# Patient Record
Sex: Male | Born: 1945 | Race: White | Hispanic: No | Smoking: Current every day smoker
Health system: Southern US, Community
[De-identification: ages and names within clinical notes are randomized; demographics above are authoritative.]

## PROBLEM LIST (undated history)

## (undated) DIAGNOSIS — F32A Depression, unspecified: Secondary | ICD-10-CM

## (undated) DIAGNOSIS — B1920 Unspecified viral hepatitis C without hepatic coma: Secondary | ICD-10-CM

## (undated) DIAGNOSIS — K219 Gastro-esophageal reflux disease without esophagitis: Secondary | ICD-10-CM

## (undated) DIAGNOSIS — J449 Chronic obstructive pulmonary disease, unspecified: Secondary | ICD-10-CM

## (undated) DIAGNOSIS — IMO0001 Reserved for inherently not codable concepts without codable children: Secondary | ICD-10-CM

## (undated) DIAGNOSIS — J40 Bronchitis, not specified as acute or chronic: Secondary | ICD-10-CM

## (undated) DIAGNOSIS — M1990 Unspecified osteoarthritis, unspecified site: Secondary | ICD-10-CM

## (undated) DIAGNOSIS — J90 Pleural effusion, not elsewhere classified: Secondary | ICD-10-CM

## (undated) DIAGNOSIS — F4310 Post-traumatic stress disorder, unspecified: Secondary | ICD-10-CM

## (undated) DIAGNOSIS — I219 Acute myocardial infarction, unspecified: Secondary | ICD-10-CM

## (undated) DIAGNOSIS — I10 Essential (primary) hypertension: Secondary | ICD-10-CM

## (undated) DIAGNOSIS — I2510 Atherosclerotic heart disease of native coronary artery without angina pectoris: Secondary | ICD-10-CM

## (undated) DIAGNOSIS — F329 Major depressive disorder, single episode, unspecified: Secondary | ICD-10-CM

## (undated) DIAGNOSIS — I509 Heart failure, unspecified: Secondary | ICD-10-CM

## (undated) HISTORY — PX: APPENDECTOMY: SHX54

## (undated) HISTORY — PX: CORONARY ANGIOPLASTY: SHX604

---

## 1999-03-17 ENCOUNTER — Emergency Department (HOSPITAL_COMMUNITY): Admission: EM | Admit: 1999-03-17 | Discharge: 1999-03-17 | Payer: Self-pay | Admitting: Emergency Medicine

## 1999-12-04 ENCOUNTER — Emergency Department (HOSPITAL_COMMUNITY): Admission: EM | Admit: 1999-12-04 | Discharge: 1999-12-04 | Payer: Self-pay | Admitting: *Deleted

## 2006-02-13 ENCOUNTER — Emergency Department (HOSPITAL_COMMUNITY): Admission: EM | Admit: 2006-02-13 | Discharge: 2006-02-13 | Payer: Self-pay | Admitting: Emergency Medicine

## 2006-03-06 ENCOUNTER — Ambulatory Visit: Payer: Self-pay | Admitting: Emergency Medicine

## 2006-03-06 ENCOUNTER — Inpatient Hospital Stay (HOSPITAL_COMMUNITY): Admission: EM | Admit: 2006-03-06 | Discharge: 2006-03-08 | Payer: Self-pay | Admitting: Emergency Medicine

## 2006-03-21 ENCOUNTER — Inpatient Hospital Stay (HOSPITAL_COMMUNITY): Admission: EM | Admit: 2006-03-21 | Discharge: 2006-03-24 | Payer: Self-pay | Admitting: Emergency Medicine

## 2006-04-21 ENCOUNTER — Ambulatory Visit: Payer: Self-pay | Admitting: *Deleted

## 2006-04-21 ENCOUNTER — Inpatient Hospital Stay (HOSPITAL_COMMUNITY): Admission: EM | Admit: 2006-04-21 | Discharge: 2006-04-24 | Payer: Self-pay | Admitting: Emergency Medicine

## 2008-06-02 IMAGING — CR DG CHEST 2V
2 series · 2 of 2 positions shown · non-contrast
Comparison: 03/08/06.

CLINICAL DATA: Shortness of breath and cough. 
 CHEST - 2 VIEW:

[view not recorded (1 of 2)]
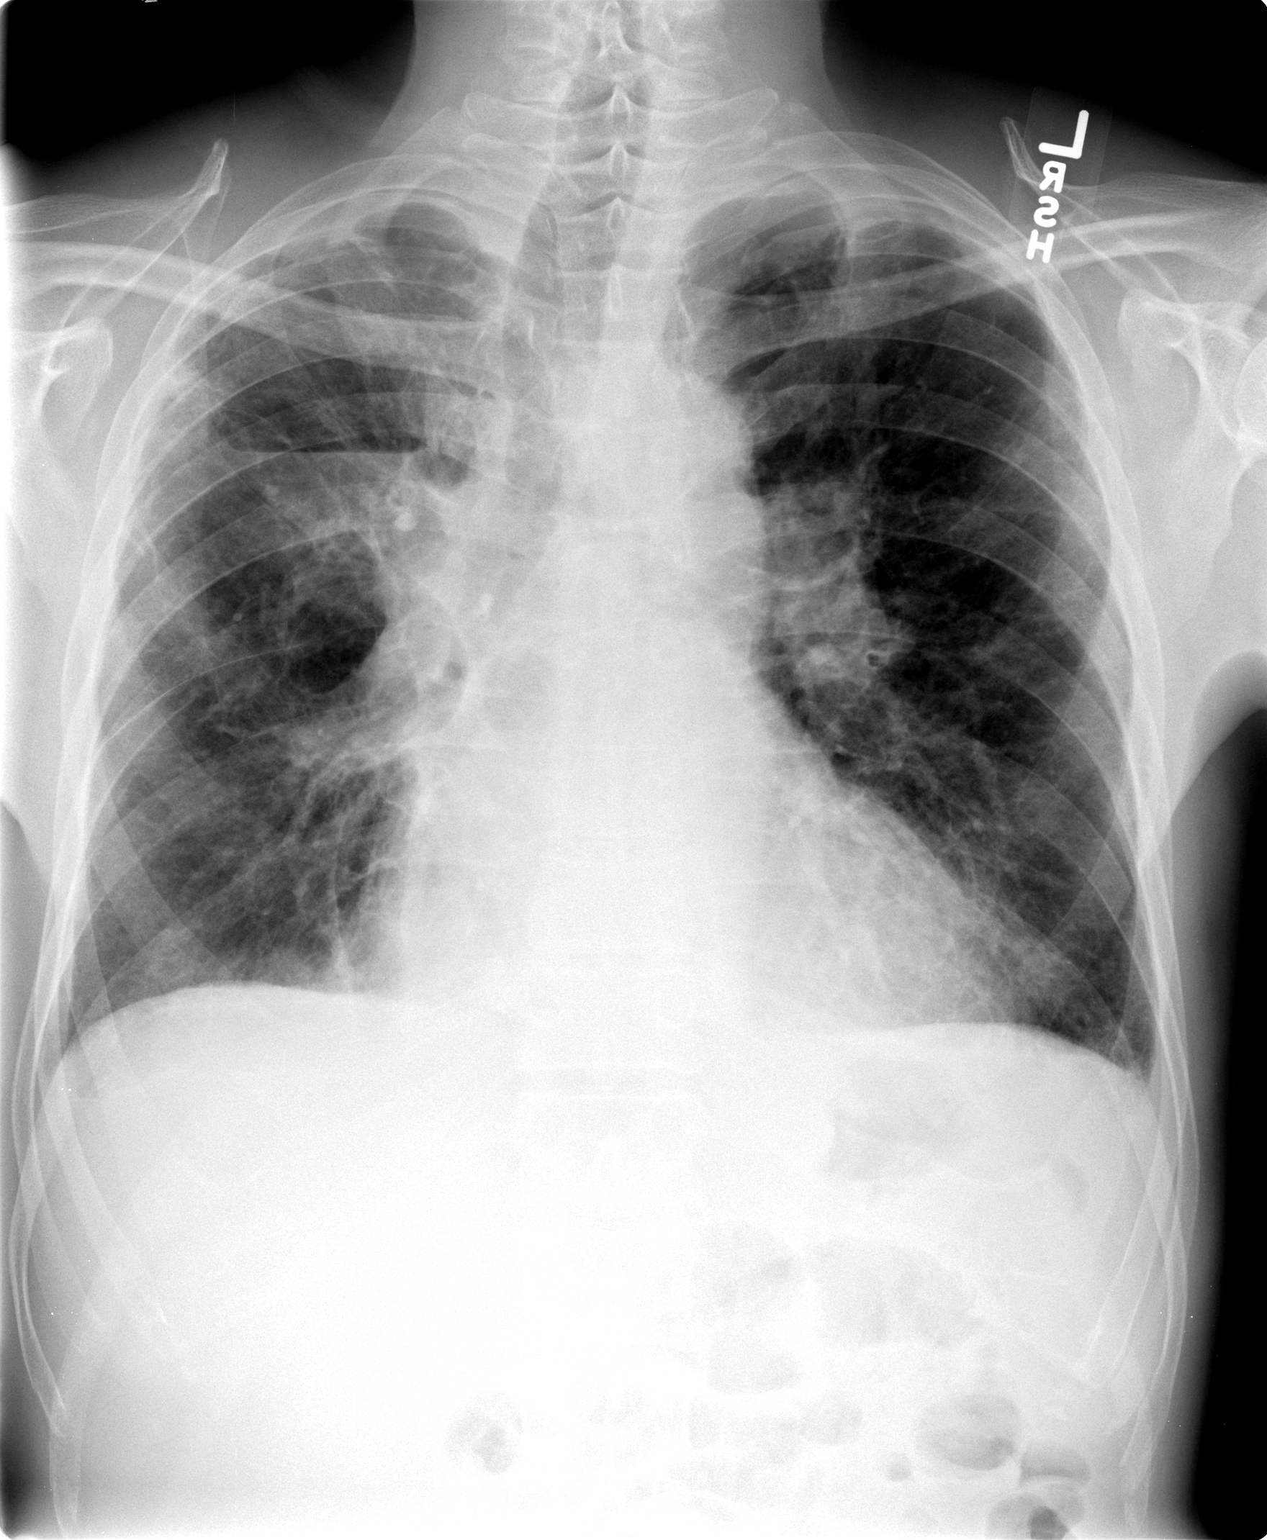

[view not recorded (2 of 2)]
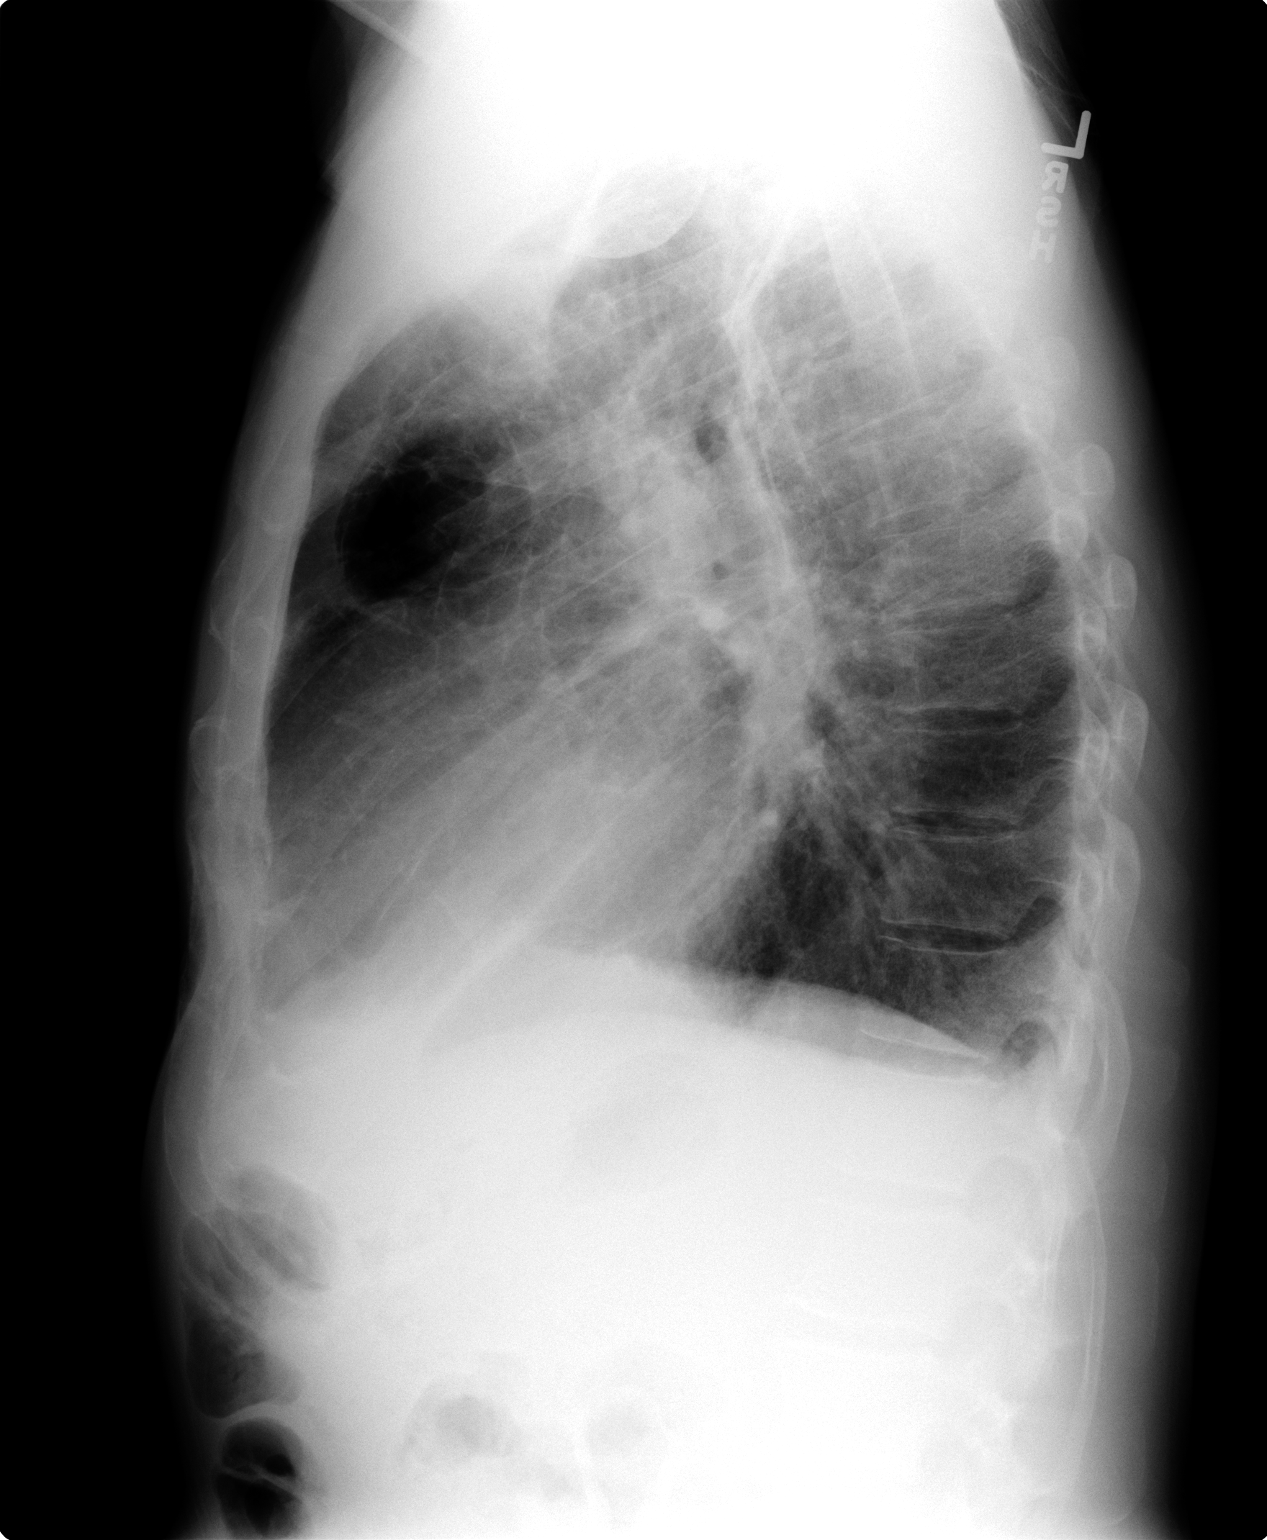

[2 of 2 positions shown; findings below may reference images not displayed]

FINDINGS: An air fluid level is seen in the right upper lung zone.  Lungs are emphysematous with bullous changes bilaterally.
IMPRESSION: Bullous emphysema with an air fluid level in the right upper lung zone.  This may represent an infected bullous lesion.  CT would be useful in further evaluation, as clinically indicated.

## 2011-10-18 ENCOUNTER — Encounter (HOSPITAL_COMMUNITY): Payer: Self-pay | Admitting: *Deleted

## 2011-10-18 ENCOUNTER — Emergency Department (HOSPITAL_COMMUNITY)
Admission: EM | Admit: 2011-10-18 | Discharge: 2011-10-18 | Disposition: A | Discharge status: Home / Self Care | Payer: Non-veteran care | Attending: Emergency Medicine | Admitting: Emergency Medicine

## 2011-10-18 ENCOUNTER — Emergency Department (HOSPITAL_COMMUNITY): Payer: Non-veteran care

## 2011-10-18 DIAGNOSIS — I509 Heart failure, unspecified: Secondary | ICD-10-CM | POA: Insufficient documentation

## 2011-10-18 DIAGNOSIS — R062 Wheezing: Secondary | ICD-10-CM | POA: Insufficient documentation

## 2011-10-18 DIAGNOSIS — I2510 Atherosclerotic heart disease of native coronary artery without angina pectoris: Secondary | ICD-10-CM | POA: Insufficient documentation

## 2011-10-18 DIAGNOSIS — R05 Cough: Secondary | ICD-10-CM | POA: Insufficient documentation

## 2011-10-18 DIAGNOSIS — I252 Old myocardial infarction: Secondary | ICD-10-CM | POA: Insufficient documentation

## 2011-10-18 DIAGNOSIS — J441 Chronic obstructive pulmonary disease with (acute) exacerbation: Secondary | ICD-10-CM | POA: Insufficient documentation

## 2011-10-18 DIAGNOSIS — R509 Fever, unspecified: Secondary | ICD-10-CM | POA: Insufficient documentation

## 2011-10-18 DIAGNOSIS — R059 Cough, unspecified: Secondary | ICD-10-CM | POA: Insufficient documentation

## 2011-10-18 DIAGNOSIS — E860 Dehydration: Secondary | ICD-10-CM | POA: Insufficient documentation

## 2011-10-18 DIAGNOSIS — I10 Essential (primary) hypertension: Secondary | ICD-10-CM | POA: Insufficient documentation

## 2011-10-18 DIAGNOSIS — R0602 Shortness of breath: Secondary | ICD-10-CM | POA: Insufficient documentation

## 2011-10-18 DIAGNOSIS — B1920 Unspecified viral hepatitis C without hepatic coma: Secondary | ICD-10-CM | POA: Insufficient documentation

## 2011-10-18 HISTORY — DX: Essential (primary) hypertension: I10

## 2011-10-18 HISTORY — DX: Chronic obstructive pulmonary disease, unspecified: J44.9

## 2011-10-18 HISTORY — DX: Acute myocardial infarction, unspecified: I21.9

## 2011-10-18 HISTORY — DX: Bronchitis, not specified as acute or chronic: J40

## 2011-10-18 HISTORY — DX: Unspecified viral hepatitis C without hepatic coma: B19.20

## 2011-10-18 HISTORY — DX: Atherosclerotic heart disease of native coronary artery without angina pectoris: I25.10

## 2011-10-18 HISTORY — DX: Pleural effusion, not elsewhere classified: J90

## 2011-10-18 HISTORY — DX: Heart failure, unspecified: I50.9

## 2011-10-18 LAB — COMPREHENSIVE METABOLIC PANEL
Alkaline Phosphatase: 55 U/L (ref 39–117)
BUN: 14 mg/dL (ref 6–23)
Calcium: 8.5 mg/dL (ref 8.4–10.5)
Creatinine, Ser: 1.11 mg/dL (ref 0.50–1.35)
GFR calc Af Amer: 79 mL/min — ABNORMAL LOW (ref >90)
Glucose, Bld: 100 mg/dL — ABNORMAL HIGH (ref 70–99)
Sodium: 135 mEq/L (ref 135–145)
Total Protein: 6.6 g/dL (ref 6.0–8.3)

## 2011-10-18 LAB — DIFFERENTIAL
Basophils Absolute: 0 10*3/uL (ref 0.0–0.1)
Lymphocytes Relative: 16 % (ref 12–46)
Monocytes Absolute: 0.7 10*3/uL (ref 0.1–1.0)
Neutro Abs: 4.2 10*3/uL (ref 1.7–7.7)

## 2011-10-18 LAB — CBC
HCT: 32.5 % — ABNORMAL LOW (ref 39.0–52.0)
RDW: 12.9 % (ref 11.5–15.5)
WBC: 6.1 10*3/uL (ref 4.0–10.5)

## 2011-10-18 LAB — POCT I-STAT TROPONIN I

## 2011-10-18 LAB — PRO B NATRIURETIC PEPTIDE: Pro B Natriuretic peptide (BNP): 1169 pg/mL — ABNORMAL HIGH (ref 0–125)

## 2011-10-18 MED ORDER — ALBUTEROL SULFATE (5 MG/ML) 0.5% IN NEBU
5.0000 mg | INHALATION_SOLUTION | Freq: Once | RESPIRATORY_TRACT | Status: AC
  Administered 2011-10-18: 5 mg via RESPIRATORY_TRACT
  Filled 2011-10-18: qty 1

## 2011-10-18 MED ORDER — PREDNISONE 20 MG PO TABS
60.0000 mg | ORAL_TABLET | Freq: Once | ORAL | Status: AC
  Administered 2011-10-18: 60 mg via ORAL
  Filled 2011-10-18: qty 3

## 2011-10-18 MED ORDER — IPRATROPIUM BROMIDE 0.02 % IN SOLN
0.5000 mg | Freq: Once | RESPIRATORY_TRACT | Status: AC
Start: 2011-10-18 — End: 2011-10-18
  Administered 2011-10-18: 0.5 mg via RESPIRATORY_TRACT
  Filled 2011-10-18: qty 2.5

## 2011-10-18 MED ORDER — PREDNISONE 10 MG PO TABS: 10.0000 mg | ORAL_TABLET | Freq: Two times a day (BID) | ORAL | Status: DC

## 2011-10-18 NOTE — Discharge Instructions (Signed)
Take prednisone as prescribed.   Use albuterol inhaler, 2 puffs every 4 hours, as needed for cough and shortness of breath.   Chronic Obstructive Pulmonary Disease Chronic obstructive pulmonary disease (COPD) is a condition in which airflow from the lungs is restricted. The lungs can never return to normal, but there are measures you can take which will improve them and make you feel better. CAUSES   Smoking.   Exposure to secondhand smoke.   Breathing in irritants (pollution, cigarette smoke, strong smells, aerosol sprays, paint fumes).   History of lung infections.  TREATMENT  Treatment focuses on making you comfortable (supportive care). Your caregiver may prescribe medications (inhaled or pills) to help improve your breathing. HOME CARE INSTRUCTIONS   If you smoke, stop smoking.   Avoid exposure to smoke, chemicals, and fumes that aggravate your breathing.   Take antibiotic medicines as directed by your caregiver.   Avoid medicines that dry up your system and slow down the elimination of secretions (antihistamines and cough syrups). This decreases respiratory capacity and may lead to infections.   Drink enough water and fluids to keep your urine clear or pale yellow. This loosens secretions.   Use humidifiers at home and at your bedside if they do not make breathing difficult.   Receive all protective vaccines your caregiver suggests, especially pneumococcal and influenza.   Use home oxygen as suggested.   Stay active. Exercise and physical activity will help maintain your ability to do things you want to do.   Eat a healthy diet.  SEEK MEDICAL CARE IF:   You develop pus-like mucus (sputum).   Breathing is more labored or exercise becomes difficult to do.   You are running out of the medicine you take for your breathing.  SEEK IMMEDIATE MEDICAL CARE IF:   You have a rapid heart rate.   You have agitation, confusion, tremors, or are in a stupor (family members may need  to observe this).   It becomes difficult to breathe.   You develop chest pain.   You have a fever.  MAKE SURE YOU:   Understand these instructions.   Will watch your condition.   Will get help right away if you are not doing well or get worse.  Document Released: 03/08/2005 Document Revised: 05/18/2011 Document Reviewed: 07/29/2010 [facility] Patient Information 2012 [place], [place].Follow up with your doctor this week if possible.  You should return to the ER if your shortness of breath worsens.

## 2011-10-18 NOTE — ED Notes (Addendum)
Here for sob & cough, onset "30 yrs ago", worse in last 4d, denies fever, LS wheezing, alert, NAD, calm, interactive, increased wob and persistant cough, guarding respirations d/t easily provoked cough. H/o COPD, CHF, CAD, MI, bronchitis and hep C. Triage questioning aborted d/t inability to answer questions d/t DOE. Taken back to blue 6, protocol orders initiated.

## 2011-10-18 NOTE — ED Notes (Signed)
Pt ambulated without difficulty. Maintained sats between 97-98% on RA. Pts breathing remained WNL. Rodney Cruise PA-C made aware.

## 2011-10-18 NOTE — ED Provider Notes (Signed)
History     CSN: [id number]  Arrival date & time 10/18/11  0530   First MD Initiated Contact with Patient 10/18/11 0602      Chief Complaint  Patient presents with  . Shortness of Breath  . Cough    (Consider location/radiation/quality/duration/timing/severity/associated sxs/prior treatment) HPI History provided by pt.   Pt a poor historian.  C/o SOB x 3-4 days.  Aggravated by exertion.  Associated w/ subjective fever, dehydration, wheezing, cough productive of yellow and green sputum.  Denies hemoptysis.  Also associated w/ tightness bilateral anterior chest.  Attributes to the blisters in his lungs filling with fluid and will not describe further, other than "this does not feel like a heart attack." Denies abd pain, N/V/D and peripheral edema.  Per prior chart, pt has a h/o MI, COPD, CHF.  Quit smoking 2 years ago.    Past Medical History  Diagnosis Date  . Coronary artery disease   . CHF (congestive heart failure)   . COPD (chronic obstructive pulmonary disease)   . Hypertension   . Bronchitis   . Hepatitis C   . Myocardial infarction acute   . Pleural effusion     No past surgical history on file.  No family history on file.  History  Substance Use Topics  . Smoking status: Never Smoker   . Smokeless tobacco: Not on file  . Alcohol Use:       Review of Systems  All other systems reviewed and are negative.    Allergies  Codeine and Thorazine  Home Medications   Current Outpatient Rx  Name Route Sig Dispense Refill  . ALBUTEROL SULFATE HFA 108 (90 BASE) MCG/ACT IN AERS Inhalation Inhale 2 puffs into the lungs every 6 (six) hours as needed. wheezing    . ASPIRIN EC 81 MG PO TBEC Oral Take 81 mg by mouth daily.    Marland Kitchen CARVEDILOL 25 MG PO TABS Oral Take 25 mg by mouth 2 (two) times daily with a meal.    . CYCLOSPORINE 0.05 % OP EMUL Both Eyes Place 1 drop into both eyes 2 (two) times daily.    Marland Kitchen LISINOPRIL 40 MG PO TABS Oral Take 40 mg by mouth daily.    Marland Kitchen  SIMVASTATIN 20 MG PO TABS Oral Take 20 mg by mouth every evening.    . TOBRAMYCIN-DEXAMETHASONE 0.3-0.1 % OP SUSP Both Eyes Place 1 drop into both eyes every 4 (four) hours while awake.      There were no vitals taken for this visit.  Physical Exam  Nursing note and vitals reviewed. Constitutional: He is oriented to person, place, and time. He appears well-developed and well-nourished. No distress.  HENT:  Head: Normocephalic and atraumatic.  Eyes:       Normal appearance  Neck: Normal range of motion.  Cardiovascular: Normal rate and regular rhythm.   Pulmonary/Chest: Effort normal and breath sounds normal. He has no wheezes. He has no rales.       Coughing.  Breath sounds diminished.   Abdominal: Soft. Bowel sounds are normal. He exhibits no distension. There is no tenderness.  Musculoskeletal: Normal range of motion.       No peripheral edema or calf ttp  Neurological: He is alert and oriented to person, place, and time.  Skin: Skin is warm and dry. No rash noted.  Psychiatric: He has a normal mood and affect. His behavior is normal.    ED Course  Procedures (including critical care time)   Date:  10/18/2011  Rate: 90  Rhythm: normal sinus rhythm  QRS Axis: normal  Intervals: PR prolonged  ST/T Wave abnormalities: normal  Conduction Disutrbances:first-degree A-V block   Narrative Interpretation:   Old EKG Reviewed: none available   Labs Reviewed  COMPREHENSIVE METABOLIC PANEL - Abnormal; Notable for the following:    Glucose, Bld 100 (*)    Albumin 2.8 (*)    GFR calc non Af Amer 68 (*)    GFR calc Af Amer 79 (*)    All other components within normal limits  CBC - Abnormal; Notable for the following:    RBC 3.85 (*)    Hemoglobin 11.5 (*)    HCT 32.5 (*)    All other components within normal limits  PRO B NATRIURETIC PEPTIDE - Abnormal; Notable for the following:    Pro B Natriuretic peptide (BNP) 1169.0 (*)    All other components within normal limits    DIFFERENTIAL  POCT I-STAT TROPONIN I   Dg Chest Portable 1 View  10/18/2011  *RADIOLOGY REPORT*  Clinical Data: Shortness of breath and cough.  PORTABLE CHEST - 1 VIEW  Comparison: Chest radiograph performed 04/23/2006  Findings: The lungs are well-aerated.  Mild biapical scarring is noted, with associated right apical bulla.  Mild left basilar scarring is also unchanged in appearance.  There is no evidence of pleural effusion or pneumothorax.  The cardiomediastinal silhouette is within normal limits.  No acute osseous abnormalities are seen.  IMPRESSION: Chronic lung changes noted, with findings of COPD; no definite superimposed airspace consolidation seen.  Original Report Authenticated By: Tonia Ghent, M.D.     1. COPD exacerbation       MDM  66yo M presents w/ h/o MI, CHF and COPD presents w/ c/o SOB, productive cough and wheezing for the past 3-4 days.  On exam, afebrile, mildly tachypneic, O2 98% room air, breath sounds diminished but no wheezing or rales, no peripheral edema or calf ttp.  EKG non-ischemic, CXR shows COPD and labs unremarkable w/ exception of BNP of 1200.  Suspect COPD exacerbation.  Pt has already received one breathing treatment d/t reported wheezing on triage exam.  He reports feeling better but not ready to walk around.  Will try one more breathing treatment as well as 60mg  prednisone and reassess.    Pt ambulated and per nursing staff, did not drop his O2 sat nor become tachypneic/SOB.  Pt reports that he felt lightheaded and is not sure if he is ready to go home.  Dr. Juleen China has seen patient and agrees that there is no indication for admission.  Pt d/c'd home w/ prednisone.  He has an albuterol inhaler.  Did not prescribed abx because exacerbation mild, his symptoms markedly improved w/ single breathing treatment and he is afebrile.  Pt has a PCP to f/u with. Return precautions discussed.      Otilio Miu, PA 10/18/11 0946  Otilio Miu,  PA 10/18/11 [phone]

## 2011-10-18 NOTE — ED Notes (Signed)
RT called for wheeze protocol. 

## 2011-10-18 NOTE — ED Notes (Signed)
Respiratory called for breathing treatment.

## 2011-10-18 NOTE — ED Notes (Signed)
Pt waiting for D/C paperwork, aware of plan.

## 2011-10-18 NOTE — ED Notes (Signed)
Pt reports having mild increase in shortness of breath, "i feel a little better but it still feels like I can't catch my breath". Pt sats are 99 % on RA. Pt is coughing, non productive at this time, pt placed on oxygen 2L via nasal cannula.

## 2011-10-23 NOTE — ED Provider Notes (Signed)
Medical screening examination/treatment/procedure(s) were conducted as a shared visit with non-physician practitioner(s) and myself.  I personally evaluated the patient during the encounter.  66 year old male with dyspnea. Possibly secondary to COPD exacerbation. At the time I evaluated the patient he had no wheezing on exam. Patient also history of CHF and MI. Doubt that current symptoms attributable to ACS. Workup and not suggestive. No evidence of overt pulmonary edema. Patient feels that is not okay to go home. I did not find criteria to make him at this time. Patient is not hypoxic. He has no respiratory distress on exam. Patient was ambulated with pulse oximetry and his O2 sat remained stable. He symptomatically denied feeling worse. Plan DC with steroids and beta agonists. Abx deferred because no infectious symptoms and even if copd exacerbation, symptoms mild. Return precautions discussed. oupt fu otherwise.  Raeford Razor, MD 10/23/11 1003

## 2012-02-14 ENCOUNTER — Encounter (HOSPITAL_COMMUNITY): Payer: Self-pay | Admitting: Emergency Medicine

## 2012-02-14 ENCOUNTER — Emergency Department (HOSPITAL_COMMUNITY)
Admission: EM | Admit: 2012-02-14 | Discharge: 2012-02-14 | Disposition: A | Discharge status: Home / Self Care | Payer: Non-veteran care | Attending: Emergency Medicine | Admitting: Emergency Medicine

## 2012-02-14 DIAGNOSIS — S0502XA Injury of conjunctiva and corneal abrasion without foreign body, left eye, initial encounter: Secondary | ICD-10-CM

## 2012-02-14 DIAGNOSIS — I2510 Atherosclerotic heart disease of native coronary artery without angina pectoris: Secondary | ICD-10-CM | POA: Insufficient documentation

## 2012-02-14 DIAGNOSIS — T1590XA Foreign body on external eye, part unspecified, unspecified eye, initial encounter: Secondary | ICD-10-CM | POA: Insufficient documentation

## 2012-02-14 DIAGNOSIS — J4489 Other specified chronic obstructive pulmonary disease: Secondary | ICD-10-CM | POA: Insufficient documentation

## 2012-02-14 DIAGNOSIS — I509 Heart failure, unspecified: Secondary | ICD-10-CM | POA: Insufficient documentation

## 2012-02-14 DIAGNOSIS — B1920 Unspecified viral hepatitis C without hepatic coma: Secondary | ICD-10-CM | POA: Insufficient documentation

## 2012-02-14 DIAGNOSIS — I10 Essential (primary) hypertension: Secondary | ICD-10-CM | POA: Insufficient documentation

## 2012-02-14 DIAGNOSIS — S058X9A Other injuries of unspecified eye and orbit, initial encounter: Secondary | ICD-10-CM | POA: Insufficient documentation

## 2012-02-14 DIAGNOSIS — I252 Old myocardial infarction: Secondary | ICD-10-CM | POA: Insufficient documentation

## 2012-02-14 DIAGNOSIS — J449 Chronic obstructive pulmonary disease, unspecified: Secondary | ICD-10-CM | POA: Insufficient documentation

## 2012-02-14 MED ORDER — TETRACAINE HCL 0.5 % OP SOLN
2.0000 [drp] | Freq: Once | OPHTHALMIC | Status: AC
  Administered 2012-02-14: 2 [drp] via OPHTHALMIC
  Filled 2012-02-14: qty 2

## 2012-02-14 MED ORDER — TOBRAMYCIN 0.3 % OP SOLN
2.0000 [drp] | Freq: Once | OPHTHALMIC | Status: AC
  Administered 2012-02-14: 2 [drp] via OPHTHALMIC
  Filled 2012-02-14: qty 5

## 2012-02-14 MED ORDER — FLUORESCEIN SODIUM 1 MG OP STRP
1.0000 | ORAL_STRIP | Freq: Once | OPHTHALMIC | Status: AC
  Administered 2012-02-14: 1 via OPHTHALMIC
  Filled 2012-02-14: qty 1

## 2012-02-14 NOTE — ED Provider Notes (Signed)
History     CSN: [id number]  Arrival date & time 02/14/12  0272   First MD Initiated Contact with Patient 02/14/12 0700      Chief Complaint  Patient presents with  . Foreign Body in Eye    (Consider location/radiation/quality/duration/timing/severity/associated sxs/prior treatment) Patient is a 66 y.o. male presenting with foreign body in eye. The history is provided by the patient.  Foreign Body in Eye Pertinent negatives include no headaches.  pt states yesterday wind blew debri/leaves in eye. States got small fb out, but now feels as if something still in eye, eye irritation, redness, tearing, esp in bright light. Constant. Left eye only. No fever/chills. No headache. No nv. No change in vision or loss of vision.     Past Medical History  Diagnosis Date  . Coronary artery disease   . CHF (congestive heart failure)   . COPD (chronic obstructive pulmonary disease)   . Hypertension   . Bronchitis   . Hepatitis C   . Myocardial infarction acute   . Pleural effusion     History reviewed. No pertinent past surgical history.  No family history on file.  History  Substance Use Topics  . Smoking status: Never Smoker   . Smokeless tobacco: Not on file  . Alcohol Use:       Review of Systems  Constitutional: Negative for fever.  Eyes: Positive for redness.  Gastrointestinal: Negative for nausea and vomiting.  Neurological: Negative for headaches.    Allergies  Codeine and Thorazine  Home Medications   Current Outpatient Rx  Name Route Sig Dispense Refill  . ALBUTEROL SULFATE HFA 108 (90 BASE) MCG/ACT IN AERS Inhalation Inhale 2 puffs into the lungs every 6 (six) hours as needed. wheezing    . ASPIRIN EC 81 MG PO TBEC Oral Take 81 mg by mouth daily.    Marland Kitchen CARVEDILOL 25 MG PO TABS Oral Take 25 mg by mouth 2 (two) times daily with a meal.    . CYCLOSPORINE 0.05 % OP EMUL Both Eyes Place 1 drop into both eyes 2 (two) times daily.    Marland Kitchen LISINOPRIL 40 MG PO TABS Oral  Take 40 mg by mouth daily.    Marland Kitchen SIMVASTATIN 20 MG PO TABS Oral Take 20 mg by mouth every evening.      BP 144/91  Pulse 90  Temp 97.4 F (36.3 C) (Oral)  Resp 20  SpO2 100%  Physical Exam  Nursing note and vitals reviewed. Constitutional: He is oriented to person, place, and time. He appears well-developed and well-nourished. No distress.  HENT:  Head: Atraumatic.  Eyes: EOM are normal. Pupils are equal, round, and reactive to light.       Conj injected left. Tearing. Lid everted, no foreign body. No orbital or periorbital cellulitis. +left corneal abrasion.   Neck: Normal range of motion. Neck supple. No tracheal deviation present.  Cardiovascular: Normal rate.   Pulmonary/Chest: Effort normal. No accessory muscle usage. No respiratory distress.  Musculoskeletal: Normal range of motion.  Lymphadenopathy:    He has no cervical adenopathy.  Neurological: He is alert and oriented to person, place, and time.  Skin: Skin is warm and dry. No rash noted.  Psychiatric: He has a normal mood and affect.    ED Course  Procedures (including critical care time)     MDM   Lids everted. No foreign body.  Tetracaine drops, relief of symptoms.  +corneal abrasion, discussed w pt.   tobrex gtts.  Suzi Roots, MD 02/14/12 [phone]

## 2012-02-14 NOTE — ED Notes (Signed)
PT. REPORTS WIND BLEW WOOD CHIPS IN HIS LEFT EYE YESTERDAY , PRESENTS WITH RED TEARY LEFT EYE .

## 2012-02-14 NOTE — ED Notes (Signed)
Pt states that he thinks that a wood chip is in his eye. Pt denies loss of vision, just sensation of a piece of wood in his left eye up under his eye lid. Pt left eye is red and watery.

## 2013-03-15 ENCOUNTER — Emergency Department: Payer: Self-pay | Admitting: Emergency Medicine

## 2013-03-15 LAB — CBC
HCT: 40.2 % (ref 40.0–52.0)
MCHC: 35.4 g/dL (ref 32.0–36.0)
Platelet: 172 10*3/uL (ref 150–440)
RDW: 13.7 % (ref 11.5–14.5)

## 2013-03-15 LAB — COMPREHENSIVE METABOLIC PANEL
Albumin: 3.5 g/dL (ref 3.4–5.0)
Alkaline Phosphatase: 87 U/L (ref 50–136)
Anion Gap: 8 (ref 7–16)
BUN: 10 mg/dL (ref 7–18)
Bilirubin,Total: 0.6 mg/dL (ref 0.2–1.0)
Calcium, Total: 8.6 mg/dL (ref 8.5–10.1)
Chloride: 104 mmol/L (ref 98–107)
Osmolality: 276 (ref 275–301)
SGOT(AST): 16 U/L (ref 15–37)

## 2013-03-15 LAB — URINALYSIS, COMPLETE
Bacteria: NONE SEEN
Bilirubin,UR: NEGATIVE
Glucose,UR: NEGATIVE mg/dL (ref 0–75)
RBC,UR: 1 /HPF (ref 0–5)
Specific Gravity: 1.016 (ref 1.003–1.030)
Squamous Epithelial: NONE SEEN
WBC UR: 4 /HPF (ref 0–5)

## 2013-09-10 ENCOUNTER — Emergency Department (HOSPITAL_COMMUNITY): Payer: Non-veteran care

## 2013-09-10 ENCOUNTER — Emergency Department (HOSPITAL_COMMUNITY)
Admission: EM | Admit: 2013-09-10 | Discharge: 2013-09-11 | Disposition: A | Discharge status: Home / Self Care | Payer: Non-veteran care | Attending: Emergency Medicine | Admitting: Emergency Medicine

## 2013-09-10 ENCOUNTER — Encounter (HOSPITAL_COMMUNITY): Payer: Self-pay | Admitting: Emergency Medicine

## 2013-09-10 DIAGNOSIS — R4182 Altered mental status, unspecified: Secondary | ICD-10-CM | POA: Insufficient documentation

## 2013-09-10 DIAGNOSIS — I10 Essential (primary) hypertension: Secondary | ICD-10-CM | POA: Insufficient documentation

## 2013-09-10 DIAGNOSIS — I2510 Atherosclerotic heart disease of native coronary artery without angina pectoris: Secondary | ICD-10-CM | POA: Insufficient documentation

## 2013-09-10 DIAGNOSIS — Z7982 Long term (current) use of aspirin: Secondary | ICD-10-CM | POA: Insufficient documentation

## 2013-09-10 DIAGNOSIS — J449 Chronic obstructive pulmonary disease, unspecified: Secondary | ICD-10-CM | POA: Insufficient documentation

## 2013-09-10 DIAGNOSIS — I509 Heart failure, unspecified: Secondary | ICD-10-CM | POA: Insufficient documentation

## 2013-09-10 DIAGNOSIS — J4489 Other specified chronic obstructive pulmonary disease: Secondary | ICD-10-CM | POA: Insufficient documentation

## 2013-09-10 DIAGNOSIS — F1010 Alcohol abuse, uncomplicated: Secondary | ICD-10-CM | POA: Insufficient documentation

## 2013-09-10 DIAGNOSIS — IMO0002 Reserved for concepts with insufficient information to code with codable children: Secondary | ICD-10-CM

## 2013-09-10 DIAGNOSIS — Z79899 Other long term (current) drug therapy: Secondary | ICD-10-CM | POA: Insufficient documentation

## 2013-09-10 DIAGNOSIS — Z8619 Personal history of other infectious and parasitic diseases: Secondary | ICD-10-CM | POA: Insufficient documentation

## 2013-09-10 DIAGNOSIS — I252 Old myocardial infarction: Secondary | ICD-10-CM | POA: Insufficient documentation

## 2013-09-10 LAB — CBC
HCT: 40.1 % (ref 39.0–52.0)
Hemoglobin: 14.5 g/dL (ref 13.0–17.0)
MCH: 31 pg (ref 26.0–34.0)
MCHC: 36.2 g/dL — ABNORMAL HIGH (ref 30.0–36.0)
MCV: 85.9 fL (ref 78.0–100.0)
PLATELETS: 205 10*3/uL (ref 150–400)
RBC: 4.67 MIL/uL (ref 4.22–5.81)
RDW: 13.3 % (ref 11.5–15.5)
WBC: 7.5 10*3/uL (ref 4.0–10.5)

## 2013-09-10 LAB — ETHANOL: ALCOHOL ETHYL (B): 228 mg/dL — AB (ref 0–11)

## 2013-09-10 LAB — BASIC METABOLIC PANEL
BUN: 18 mg/dL (ref 6–23)
CALCIUM: 8.7 mg/dL (ref 8.4–10.5)
CO2: 19 meq/L (ref 19–32)
CREATININE: 0.88 mg/dL (ref 0.50–1.35)
Chloride: 97 mEq/L (ref 96–112)
GFR calc Af Amer: >90 mL/min (ref >90)
GFR, EST NON AFRICAN AMERICAN: 88 mL/min — AB (ref >90)
Glucose, Bld: 132 mg/dL — ABNORMAL HIGH (ref 70–99)
Potassium: 3.6 mEq/L — ABNORMAL LOW (ref 3.7–5.3)
Sodium: 135 mEq/L — ABNORMAL LOW (ref 137–147)

## 2013-09-10 LAB — I-STAT TROPONIN, ED: Troponin i, poc: 0 ng/mL (ref 0.00–0.08)

## 2013-09-10 LAB — ACETAMINOPHEN LEVEL: Acetaminophen (Tylenol), Serum: <15 ug/mL (ref 10–30)

## 2013-09-10 LAB — SALICYLATE LEVEL

## 2013-09-10 MED ORDER — ONDANSETRON 4 MG PO TBDP
8.0000 mg | ORAL_TABLET | Freq: Once | ORAL | Status: AC
  Administered 2013-09-10: 8 mg via ORAL
  Filled 2013-09-10: qty 2

## 2013-09-10 MED ORDER — ZIPRASIDONE MESYLATE 20 MG IM SOLR: 20.0000 mg | Freq: Once | INTRAMUSCULAR | Status: DC

## 2013-09-10 NOTE — ED Notes (Signed)
Pt. Yelled "Yes I know where the Explicative I am, you're not God and I don't have to open my eye's for you.

## 2013-09-10 NOTE — ED Notes (Signed)
Patient transported to CT 

## 2013-09-10 NOTE — ED Provider Notes (Signed)
CSN: [id number]     Arrival date & time 09/10/13  1944 History   First MD Initiated Contact with Patient 09/10/13 2027     Chief Complaint  Patient presents with  . Emesis  . Alcohol Intoxication     (Consider location/radiation/quality/duration/timing/severity/associated sxs/prior Treatment) HPI History provided by EMS.  Level 5 caveat applies because patient is intoxicated.   [place] because he was vomiting and appeared intoxicated.  Pt was uncooperative in triage and dry heaving.  Per prior chart, pt has h/o CAD, COPD, HTN, Hep C.  It has been reported in the past that he drinks alcohol excessively, though he has never been to the ER for intoxication.  Pt difficult to arouse and I can therefore not obtain any history from him.   Past Medical History  Diagnosis Date  . Coronary artery disease   . CHF (congestive heart failure)   . COPD (chronic obstructive pulmonary disease)   . Hypertension   . Bronchitis   . Hepatitis C   . Myocardial infarction acute   . Pleural effusion    History reviewed. No pertinent past surgical history. History reviewed. No pertinent family history. History  Substance Use Topics  . Smoking status: Never Smoker   . Smokeless tobacco: Not on file  . Alcohol Use: Yes     Comment: Excessive    Review of Systems  All other systems reviewed and are negative.      Allergies  Codeine and Thorazine  Home Medications   Current Outpatient Rx  Name  Route  Sig  Dispense  Refill  . albuterol (PROVENTIL HFA;VENTOLIN HFA) 108 (90 BASE) MCG/ACT inhaler   Inhalation   Inhale 2 puffs into the lungs every 6 (six) hours as needed. wheezing         . aspirin EC 81 MG tablet   Oral   Take 81 mg by mouth daily.         . carvedilol (COREG) 25 MG tablet   Oral   Take 25 mg by mouth 2 (two) times daily with a meal.         . cycloSPORINE (RESTASIS) 0.05 % ophthalmic emulsion   Both Eyes   Place 1 drop into  both eyes 2 (two) times daily.         Marland Kitchen lisinopril (PRINIVIL,ZESTRIL) 40 MG tablet   Oral   Take 40 mg by mouth daily.         . simvastatin (ZOCOR) 20 MG tablet   Oral   Take 20 mg by mouth every evening.          BP 120/90  Pulse 88  Resp 22  Ht 5\' 10"  (1.778 m)  Wt 170 lb (77.111 kg)  BMI 24.39 kg/m2  SpO2 95% Physical Exam  Nursing note and vitals reviewed. Constitutional: He is oriented to person, place, and time. He appears well-developed and well-nourished. No distress.  HENT:  Head: Normocephalic.  No visible head trauma  Eyes:  Normal appearance  Neck: Normal range of motion.  Cardiovascular: Normal rate and regular rhythm.   Pulmonary/Chest: Effort normal and breath sounds normal. No respiratory distress.  Musculoskeletal: Normal range of motion.  Neurological: He is alert and oriented to person, place, and time.  Skin: Skin is warm and dry. No rash noted.  Psychiatric: He has a normal mood and affect. His behavior is normal.    ED Course  Procedures (including critical care  time) Labs Review Labs Reviewed  BASIC METABOLIC PANEL - Abnormal; Notable for the following:    Sodium 135 (*)    Potassium 3.6 (*)    Glucose, Bld 132 (*)    GFR calc non Af Amer 88 (*)    All other components within normal limits  CBC - Abnormal; Notable for the following:    MCHC 36.2 (*)    All other components within normal limits  SALICYLATE LEVEL - Abnormal; Notable for the following:    Salicylate Lvl <2.0 (*)    All other components within normal limits  ETHANOL - Abnormal; Notable for the following:    Alcohol, Ethyl (B) 228 (*)    All other components within normal limits  ACETAMINOPHEN LEVEL  I-STAT TROPOININ, ED   Imaging Review Ct Head Wo Contrast  09/10/2013   CLINICAL DATA:  Mental status changes  EXAM: CT HEAD WITHOUT CONTRAST  TECHNIQUE: Contiguous axial images were obtained from the base of the skull through the vertex without intravenous contrast.   COMPARISON:  None.  FINDINGS: No acute intracranial abnormality. Specifically, no hemorrhage, hydrocephalus, mass lesion, acute infarction, or significant intracranial injury. No acute calvarial abnormality. Global atrophy. Mild diffuse areas of low attenuation within the subcortical, deep, and periventricular white matter regions, consistent with small vessel white matter ischemic change. Mucosal thickening within the ethmoid air cells. Remaining visualized paranasal sinuses and mastoid air cells are patent.  IMPRESSION: Chronic and involutional changes without evidence of acute abnormalities.  Findings may reflect sinus disease in the ethmoid air cells.   Electronically Signed   By: Salome HolmesHector  Cooper M.D.   On: 09/10/2013 21:46     EKG Interpretation None      MDM   Final diagnoses:  Intoxication    67yo M [place] ER by EMS [place]  Pt difficult to arouse and I can not obtain any history from him.  VS w/in nml range, no visible head trauma, CT head and labs pending.  Pt is on the monitor and I will continue to observe.  8:45 PM   Pt awake and is agitated and argumentative.  Cursing at myself and nursing staff.  Etoh level pending but rest of labs unremarkable and CT head non-acute.  [facility]  I told him that if he can find a ride home, he can leave, otherwise we will keep him overnight.  I believe that he would be a danger to himself if he was discharged now and he is not capable of making decisions for himself.  10:21 PM   Pt has a ride.  Ambulatory and taking pos. VSS w/ exception of tachycardia (pt walking and worked up just prior to vitals being obtained).  D/c'd home. 12:09 AM   Otilio Miuatherine E Thierno Hun, PA-C 09/11/13 0515  Otilio Miuatherine E Shaelee Forni, PA-C 09/11/13 [phone]

## 2013-09-10 NOTE — ED Notes (Addendum)
[place] for complaint of [place]. Patient uncooperative in triage. Repeatedly states "i'm gonna throw up". Appears to be dry heaving only, EMS reported the same.

## 2013-09-10 NOTE — ED Notes (Signed)
Security at bedside. Pt. Belligerent yelling at staff!

## 2013-09-10 NOTE — ED Notes (Signed)
Earna CoderZachary RN at bedside with pt.

## 2013-09-10 NOTE — ED Notes (Signed)
Pt demanding [facility] the [facility]. PA notified

## 2013-09-10 NOTE — ED Notes (Signed)
Pt arrived to room. Refuses to turn over and or open his eyes. PA  At bedside to assess. Vitals Stable.

## 2013-09-11 NOTE — Discharge Instructions (Signed)
Try to cut back on your alcohol consumption.  Drink plenty of water. You may return to the ER if symptoms worsen or you have any other concerns.

## 2013-09-11 NOTE — ED Provider Notes (Signed)
Medical screening examination/treatment/procedure(s) were performed by non-physician practitioner and as supervising physician I was immediately available for consultation/collaboration.   EKG Interpretation None        Richardean Canalavid H Raahil Ong, MD 09/11/13 1103

## 2013-09-11 NOTE — ED Notes (Signed)
Pt. refused to sign discharge or listen to D/C papers. Paperwork given to responsible driver.

## 2015-04-07 ENCOUNTER — Encounter
Admission: RE | Admit: 2015-04-07 | Discharge: 2015-04-07 | Disposition: A | Discharge status: Home / Self Care | Payer: Non-veteran care | Source: Ambulatory Visit | Attending: Surgery | Admitting: Surgery

## 2015-04-07 DIAGNOSIS — Z01810 Encounter for preprocedural cardiovascular examination: Secondary | ICD-10-CM | POA: Diagnosis not present

## 2015-04-07 DIAGNOSIS — K4090 Unilateral inguinal hernia, without obstruction or gangrene, not specified as recurrent: Secondary | ICD-10-CM | POA: Insufficient documentation

## 2015-04-07 DIAGNOSIS — Z01818 Encounter for other preprocedural examination: Secondary | ICD-10-CM | POA: Insufficient documentation

## 2015-04-07 HISTORY — DX: Major depressive disorder, single episode, unspecified: F32.9

## 2015-04-07 HISTORY — DX: Depression, unspecified: F32.A

## 2015-04-07 HISTORY — DX: Post-traumatic stress disorder, unspecified: F43.10

## 2015-04-07 HISTORY — DX: Reserved for inherently not codable concepts without codable children: IMO0001

## 2015-04-07 HISTORY — DX: Gastro-esophageal reflux disease without esophagitis: K21.9

## 2015-04-07 HISTORY — DX: Unspecified osteoarthritis, unspecified site: M19.90

## 2015-04-07 LAB — URINALYSIS COMPLETE WITH MICROSCOPIC (ARMC ONLY)
BACTERIA UA: NONE SEEN
GLUCOSE, UA: NEGATIVE mg/dL
HGB URINE DIPSTICK: NEGATIVE
KETONES UR: NEGATIVE mg/dL
LEUKOCYTES UA: NEGATIVE
NITRITE: NEGATIVE
Protein, ur: NEGATIVE mg/dL
SPECIFIC GRAVITY, URINE: 1.024 (ref 1.005–1.030)
Squamous Epithelial / LPF: NONE SEEN
pH: 6 (ref 5.0–8.0)

## 2015-04-07 LAB — PROTIME-INR
INR: 1.14
PROTHROMBIN TIME: 14.8 s (ref 11.4–15.0)

## 2015-04-07 NOTE — Patient Instructions (Signed)
  Your procedure is scheduled on: 04/16/15 Report to Day Surgery. [facility]LO[facility] To find out your arrival time please call [phone] between 1PM - 3PM on11/3/16.  Remember: Instructions that are not followed completely may result in serious medical risk, up to and including death, or upon the discretion of your surgeon and anesthesiologist your surgery may need to be rescheduled.    __X__ 1. Do not eat food or drink liquids after midnight. No gum chewing or hard candies.     __X__ 2. No Alcohol for 24 hours before or after surgery.   ____ 3. Bring all medications with you on the day of surgery if instructed.    __X__ 4. Notify your doctor if there is any change in your medical condition     (cold, fever, infections).     Do not wear jewelry, make-up, hairpins, clips or nail polish.  Do not wear lotions, powders, or perfumes. You may wear deodorant.  Do not shave 48 hours prior to surgery. Men may shave face and neck.  Do not bring valuables to the hospital.    [facility] is not responsible for any belongings or valuables.               Contacts, dentures or bridgework may not be worn into surgery.  Leave your suitcase in the car. After surgery it may be brought to your room.  For patients admitted to the hospital, discharge time is determined by your                treatment team.   Patients discharged the day of surgery will not be allowed to drive home.   Please read over the following fact sheets that you were given:   Surgical Site Infection Prevention   ____ Take these medicines the morning of surgery with A SIP OF WATER:    1. LISINOPRIL  2. OMEPRAZOLE  3. HYDRALAZINE  4.METOPROLOL  5.  6.  ____ Fleet Enema (as directed)   __X__ Use CHG Soap as directed  _X___ Use inhalers on the day of surgery  ____ Stop metformin 2 days prior to surgery    ____ Take 1/2 of usual insulin dose the night before surgery and none on the morning of surgery.   _X___  Stop Coumadin/Plavix/aspirin on  STOP ASPIRIN 1 WEEK BEFORE SURGERY  ____ Stop Anti-inflammatories on    ____ Stop supplements until after surgery.    ____ Bring C-Pap to the hospital.

## 2015-04-15 NOTE — OR Nursing (Signed)
PER BECKY RN IN SDS PATIENT NOT ABLE TO [facility] TO [facility]ARDIOLOGIST ,SO SURGERY FOR 04/16/15 CNL

## 2015-04-16 ENCOUNTER — Encounter: Admission: RE | Payer: Self-pay | Source: Ambulatory Visit

## 2015-04-16 ENCOUNTER — Ambulatory Visit: Admission: RE | Admit: 2015-04-16 | Payer: Non-veteran care | Source: Ambulatory Visit | Admitting: Surgery

## 2015-04-16 SURGERY — REPAIR, HERNIA, INGUINAL, ADULT
Anesthesia: General | Laterality: Left

## 2015-04-23 ENCOUNTER — Encounter
Admission: RE | Disposition: A | Discharge status: Home / Self Care | Payer: Self-pay | Source: Ambulatory Visit | Attending: Surgery

## 2015-04-23 ENCOUNTER — Ambulatory Visit: Payer: Non-veteran care | Admitting: *Deleted

## 2015-04-23 ENCOUNTER — Encounter: Payer: Self-pay | Admitting: *Deleted

## 2015-04-23 ENCOUNTER — Ambulatory Visit
Admission: RE | Admit: 2015-04-23 | Discharge: 2015-04-23 | Disposition: A | Discharge status: Home / Self Care | Payer: Non-veteran care | Source: Ambulatory Visit | Attending: Surgery | Admitting: Surgery

## 2015-04-23 DIAGNOSIS — E785 Hyperlipidemia, unspecified: Secondary | ICD-10-CM | POA: Insufficient documentation

## 2015-04-23 DIAGNOSIS — Z7951 Long term (current) use of inhaled steroids: Secondary | ICD-10-CM | POA: Diagnosis not present

## 2015-04-23 DIAGNOSIS — Z8619 Personal history of other infectious and parasitic diseases: Secondary | ICD-10-CM | POA: Diagnosis not present

## 2015-04-23 DIAGNOSIS — I252 Old myocardial infarction: Secondary | ICD-10-CM | POA: Diagnosis not present

## 2015-04-23 DIAGNOSIS — Z888 Allergy status to other drugs, medicaments and biological substances status: Secondary | ICD-10-CM | POA: Insufficient documentation

## 2015-04-23 DIAGNOSIS — Z7982 Long term (current) use of aspirin: Secondary | ICD-10-CM | POA: Diagnosis not present

## 2015-04-23 DIAGNOSIS — K4090 Unilateral inguinal hernia, without obstruction or gangrene, not specified as recurrent: Secondary | ICD-10-CM | POA: Diagnosis not present

## 2015-04-23 DIAGNOSIS — Z885 Allergy status to narcotic agent status: Secondary | ICD-10-CM | POA: Insufficient documentation

## 2015-04-23 DIAGNOSIS — Z79899 Other long term (current) drug therapy: Secondary | ICD-10-CM | POA: Insufficient documentation

## 2015-04-23 DIAGNOSIS — I10 Essential (primary) hypertension: Secondary | ICD-10-CM | POA: Insufficient documentation

## 2015-04-23 DIAGNOSIS — F17200 Nicotine dependence, unspecified, uncomplicated: Secondary | ICD-10-CM | POA: Insufficient documentation

## 2015-04-23 HISTORY — PX: INGUINAL HERNIA REPAIR: SHX194

## 2015-04-23 SURGERY — REPAIR, HERNIA, INGUINAL, ADULT
Anesthesia: General | Laterality: Left | Wound class: Clean

## 2015-04-23 MED ORDER — HYDROMORPHONE HCL 1 MG/ML IJ SOLN
INTRAMUSCULAR | Status: AC
  Filled 2015-04-23: qty 1

## 2015-04-23 MED ORDER — BUPIVACAINE-EPINEPHRINE (PF) 0.5% -1:200000 IJ SOLN
INTRAMUSCULAR | Status: DC | PRN
  Administered 2015-04-23: 12 mL

## 2015-04-23 MED ORDER — MIDAZOLAM HCL 2 MG/2ML IJ SOLN
INTRAMUSCULAR | Status: DC | PRN
  Administered 2015-04-23: 2 mg via INTRAVENOUS

## 2015-04-23 MED ORDER — FENTANYL CITRATE (PF) 100 MCG/2ML IJ SOLN
INTRAMUSCULAR | Status: DC | PRN
  Administered 2015-04-23 (×4): 25 ug via INTRAVENOUS

## 2015-04-23 MED ORDER — LABETALOL HCL 5 MG/ML IV SOLN
5.0000 mg | Freq: Once | INTRAVENOUS | Status: AC
  Administered 2015-04-23: 5 mg via INTRAVENOUS

## 2015-04-23 MED ORDER — PROPOFOL 10 MG/ML IV BOLUS
INTRAVENOUS | Status: DC | PRN
  Administered 2015-04-23: 140 mg via INTRAVENOUS
  Administered 2015-04-23: 30 mg via INTRAVENOUS

## 2015-04-23 MED ORDER — LACTATED RINGERS IV SOLN
INTRAVENOUS | Status: DC
  Administered 2015-04-23 (×2): via INTRAVENOUS

## 2015-04-23 MED ORDER — FENTANYL CITRATE (PF) 100 MCG/2ML IJ SOLN
INTRAMUSCULAR | Status: AC
  Filled 2015-04-23: qty 2

## 2015-04-23 MED ORDER — HYDROCODONE-ACETAMINOPHEN 5-325 MG PO TABS: 1.0000 | ORAL_TABLET | ORAL | Status: DC | PRN

## 2015-04-23 MED ORDER — CEFAZOLIN SODIUM-DEXTROSE 2-3 GM-% IV SOLR
2.0000 g | INTRAVENOUS | Status: AC
  Administered 2015-04-23: 2 g via INTRAVENOUS

## 2015-04-23 MED ORDER — HYDROCODONE-ACETAMINOPHEN 5-325 MG PO TABS
1.0000 | ORAL_TABLET | ORAL | Status: DC | PRN
Start: 2015-04-23 — End: 2018-08-02

## 2015-04-23 MED ORDER — PHENYLEPHRINE HCL 10 MG/ML IJ SOLN
INTRAMUSCULAR | Status: DC | PRN
  Administered 2015-04-23 (×2): 100 ug via INTRAVENOUS

## 2015-04-23 MED ORDER — LABETALOL HCL 5 MG/ML IV SOLN
INTRAVENOUS | Status: AC
  Filled 2015-04-23: qty 4

## 2015-04-23 MED ORDER — DEXAMETHASONE SODIUM PHOSPHATE 4 MG/ML IJ SOLN
INTRAMUSCULAR | Status: DC | PRN
  Administered 2015-04-23: 10 mg via INTRAVENOUS

## 2015-04-23 MED ORDER — ONDANSETRON HCL 4 MG/2ML IJ SOLN
INTRAMUSCULAR | Status: DC | PRN
  Administered 2015-04-23: 4 mg via INTRAVENOUS

## 2015-04-23 MED ORDER — BUPIVACAINE-EPINEPHRINE (PF) 0.5% -1:200000 IJ SOLN
INTRAMUSCULAR | Status: AC
  Filled 2015-04-23: qty 30

## 2015-04-23 MED ORDER — CEFAZOLIN SODIUM-DEXTROSE 2-3 GM-% IV SOLR
INTRAVENOUS | Status: AC
  Administered 2015-04-23: 2 g via INTRAVENOUS
  Filled 2015-04-23: qty 50

## 2015-04-23 MED ORDER — HYDROMORPHONE HCL 1 MG/ML IJ SOLN
0.2500 mg | INTRAMUSCULAR | Status: DC | PRN
  Administered 2015-04-23 (×4): 0.5 mg via INTRAVENOUS

## 2015-04-23 MED ORDER — ONDANSETRON HCL 4 MG/2ML IJ SOLN: 4.0000 mg | Freq: Once | INTRAMUSCULAR | Status: DC | PRN

## 2015-04-23 MED ORDER — EPHEDRINE SULFATE 50 MG/ML IJ SOLN
INTRAMUSCULAR | Status: DC | PRN
  Administered 2015-04-23 (×6): 5 mg via INTRAVENOUS

## 2015-04-23 MED ORDER — LIDOCAINE HCL (CARDIAC) 20 MG/ML IV SOLN
INTRAVENOUS | Status: DC | PRN
  Administered 2015-04-23: 100 mg via INTRAVENOUS

## 2015-04-23 SURGICAL SUPPLY — 24 items
BLADE SURG 15 STRL LF DISP TIS (BLADE) ×1 IMPLANT
BLADE SURG 15 STRL SS (BLADE) ×2
CANISTER SUCT 1200ML W/VALVE (MISCELLANEOUS) ×3 IMPLANT
CHLORAPREP W/TINT 26ML (MISCELLANEOUS) ×3 IMPLANT
DRAIN PENROSE 5/8X18 LTX STRL (WOUND CARE) ×3 IMPLANT
DRAPE PED LAPAROTOMY (DRAPES) ×3 IMPLANT
GLOVE BIO SURGEON STRL SZ7.5 (GLOVE) ×12 IMPLANT
GOWN STRL REUS W/ TWL LRG LVL3 (GOWN DISPOSABLE) ×3 IMPLANT
GOWN STRL REUS W/TWL LRG LVL3 (GOWN DISPOSABLE) ×6
KIT RM TURNOVER STRD PROC AR (KITS) ×3 IMPLANT
LABEL OR SOLS (LABEL) ×3 IMPLANT
LIQUID BAND (GAUZE/BANDAGES/DRESSINGS) ×3 IMPLANT
MESH SYNTHETIC 4X6 SOFT BARD (Mesh General) ×1 IMPLANT
MESH SYNTHETIC SOFT BARD 4X6 (Mesh General) ×2 IMPLANT
NEEDLE HYPO 25X1 1.5 SAFETY (NEEDLE) IMPLANT
NS IRRIG 500ML POUR BTL (IV SOLUTION) ×3 IMPLANT
PACK BASIN MINOR ARMC (MISCELLANEOUS) ×3 IMPLANT
PAD GROUND ADULT SPLIT (MISCELLANEOUS) ×3 IMPLANT
SUT CHROMIC 4 0 RB 1X27 (SUTURE) IMPLANT
SUT MNCRL AB 4-0 PS2 18 (SUTURE) IMPLANT
SUT SURGILON 0 30 BLK (SUTURE) IMPLANT
SUT VIC AB 4-0 SH 27 (SUTURE)
SUT VIC AB 4-0 SH 27XANBCTRL (SUTURE) IMPLANT
SYRINGE 10CC LL (SYRINGE) ×3 IMPLANT

## 2015-04-23 NOTE — Anesthesia Preprocedure Evaluation (Addendum)
Anesthesia Evaluation  Patient identified by MRN, date of birth, ID band Patient awake    Reviewed: Allergy & Precautions, NPO status , Patient's Chart, lab work & pertinent test results  Airway Mallampati: II  TM Distance: >3 FB Neck ROM: Limited    Dental  (+) Upper Dentures, Edentulous Lower   Pulmonary shortness of breath and with exertion, COPD,  COPD inhaler, Current Smoker,     + decreased breath sounds      Cardiovascular Exercise Tolerance: Poor hypertension, Pt. on medications and Pt. on home beta blockers + CAD, + Past MI and + Cardiac Stents  Normal cardiovascular exam Rhythm:Regular Rate:Normal  MI over 10 years ago--presented with SOB--had two stents and has done well since. Off blood thinners and recent nuclear stress test shows normal LV function, EF>55%.   Neuro/Psych Depression    GI/Hepatic GERD  Medicated and Controlled,(+) Hepatitis -, C  Endo/Other    Renal/GU      Musculoskeletal  (+) Arthritis , Osteoarthritis,    Abdominal (+)  Abdomen: soft.    Peds  Hematology   Anesthesia Other Findings   Reproductive/Obstetrics                           Anesthesia Physical Anesthesia Plan  ASA: III  Anesthesia Plan: General   Post-op Pain Management:    Induction: Intravenous  Airway Management Planned: Oral ETT  Additional Equipment:   Intra-op Plan:   Post-operative Plan: Extubation in OR  Informed Consent: I have reviewed the patients History and Physical, chart, labs and discussed the procedure including the risks, benefits and alternatives for the proposed anesthesia with the patient or authorized representative who has indicated his/her understanding and acceptance.     Plan Discussed with:   Anesthesia Plan Comments:         Anesthesia Quick Evaluation

## 2015-04-23 NOTE — Op Note (Signed)
OPERATIVE REPORT  PREOPERATIVE DIAGNOSIS: left inguinal hernia  POSTOPERATIVE DIAGNOSIS:left  inguinal hernia  PROCEDURE:  left inguinal hernia repair  ANESTHESIA:  General  SURGEON:  Renda RollsWilton Tajai Ihde M.D.  INDICATIONS: He has had recent pain in the left groin. A left inguinal hernia was demonstrated on physical exam and repair was recommended for definitive treatment.  With the patient on the operating table in the supine position the left lower quadrant was prepared with clippers and with ChloraPrep and draped in a sterile manner. A transversely oriented suprapubic incision was made and carried down through subcutaneous tissues. Electrocautery was used for hemostasis. One traversing small artery at the lateral end of the incision was suture ligated with 4-0 chromic proximally and distally and divided. The Scarpa's fascia was incised. The external oblique aponeurosis was incised along the course of its fibers to open the external ring and expose the inguinal cord structures. The cord structures were mobilized. A Penrose drain was passed around the cord structures for traction. Cremaster fibers were separated to examine the cord structures. There was no indirect component. There was a direct inguinal hernia. The sac was dissected free from surrounding structures and was inverted. The repair was carried out with 0 Surgilon sutures suturing the conjoined tendon to the shelving edge of the inguinal ligament incorporating transversalis fascia into the repair last stitch led to narrowing of the internal ring. A relaxing incision was made medially  Bard soft mesh was cut to create an oval shape and was placed over the repair. This was sutured to the repair with interrupted 0 Surgilon sutures and also sutured medially to the deep fascia and on both sides of the internal ring. Next after seeing hemostasis was intact the cord structures were replaced along the floor of the inguinal canal. The cut edges of the  external oblique aponeurosis were closed with a running 4-0 Vicryl suture to re-create the external ring. The deep fascia superior and lateral to the repair site was infiltrated with half percent Sensorcaine with epinephrine. Subcutaneous tissues were also infiltrated. The Scarpa's fascia was closed with interrupted 4-0 Vicryl sutures. The skin was closed with running 4-0 Monocryl subcuticular suture and LiquiBand. The testicle remained in the scrotum  The patient appeared to be in satisfactory condition and was prepared for transfer to the recovery room.  Renda RollsWilton Inesha Sow M.D.

## 2015-04-23 NOTE — Progress Notes (Signed)
Patient stated that he took his aspirin this morning, after he had been off of aspirin since 10/29. RN will check with MD to ensure this is ok. Patient arrived with stress test instructions rather than SDS instructions in hand.

## 2015-04-23 NOTE — Anesthesia Postprocedure Evaluation (Signed)
  Anesthesia Post-op Note  Patient: Ian Herman  Procedure(s) Performed: Procedure(s): HERNIA REPAIR INGUINAL ADULT (Left)  Anesthesia type:General  Patient location: PACU  Post pain: Pain level controlled  Post assessment: Post-op Vital signs reviewed, Patient's Cardiovascular Status Stable, Respiratory Function Stable, Patent Airway and No signs of Nausea or vomiting  Post vital signs: Reviewed and stable  Last Vitals:  Filed Vitals:   04/23/15 1819  BP: 174/102  Pulse: 73  Temp:   Resp: 16    Level of consciousness: awake, alert  and patient cooperative  Complications: No apparent anesthesia complications

## 2015-04-23 NOTE — OR Nursing (Signed)
Patient instructed on importance of taking his scheduled BP meds, understanding verbalized, patient friend  Eunice BlaseDebbie present.

## 2015-04-23 NOTE — Discharge Instructions (Signed)
Take Tylenol or Norco if needed for pain. ° °May resume aspirin on Saturday. ° °May shower. ° °Avoid straining and heavy lifting. °

## 2015-04-23 NOTE — OR Nursing (Signed)
Patient OK for discharge per DR Karlton LemonKarenz BP 180/98

## 2015-04-23 NOTE — Anesthesia Procedure Notes (Signed)
Procedure Name: LMA Insertion Date/Time: 04/23/2015 3:28 PM Performed by: Peyton NajjarSIMMONS, Ian Trahan Pre-anesthesia Checklist: Patient identified, Patient being monitored, Timeout performed, Emergency Drugs available and Suction available Patient Re-evaluated:Patient Re-evaluated prior to inductionOxygen Delivery Method: Circle system utilized Preoxygenation: Pre-oxygenation with 100% oxygen Intubation Type: IV induction Ventilation: Two handed mask ventilation required LMA: LMA inserted LMA Size: 4.0 Tube type: Oral Number of attempts: 1 Placement Confirmation: positive ETCO2 and breath sounds checked- equal and bilateral Tube secured with: Tape Dental Injury: Teeth and Oropharynx as per pre-operative assessment

## 2015-04-23 NOTE — H&P (Signed)
  His cardiologist reported he has a mild cardiac risk for surgery. He comes in today for left inguinal hernia repair he was off aspirin for 1 week but did take it this morning. I advised him that he may get some bruising with the surgery. Plan is for left hernia repair. Have marked the site

## 2015-04-23 NOTE — Transfer of Care (Signed)
Immediate Anesthesia Transfer of Care Note  Patient: Ian Herman  Procedure(s) Performed: Procedure(s): HERNIA REPAIR INGUINAL ADULT (Left)  Patient Location: PACU  Anesthesia Type:General  Level of Consciousness: awake  Airway & Oxygen Therapy: Patient Spontanous Breathing and Patient connected to face mask oxygen  Post-op Assessment: Report given to RN  Post vital signs: Reviewed  Last Vitals:  Filed Vitals:   04/23/15 1705  BP: 151/84  Pulse: 78  Temp: 36.5 C  Resp: 15    Complications: No apparent anesthesia complications

## 2015-04-23 NOTE — Progress Notes (Signed)
Patient arrived upon SDS drinking bottle of water with 1/4 of water left in bottle. Patient stated he had been told that he could not drink anything but water after midnight and had drank about 3/4 of the bottle today. Ate last previous evening. RN spoke with Dr. Dimple Caseyice who ok'ed patient for surgery if he stops drinking water at this time.

## 2015-04-26 ENCOUNTER — Encounter: Payer: Self-pay | Admitting: Surgery

## 2016-02-10 ENCOUNTER — Emergency Department
Admission: EM | Admit: 2016-02-10 | Discharge: 2016-02-10 | Disposition: A | Discharge status: Home / Self Care | Payer: Non-veteran care | Attending: Emergency Medicine | Admitting: Emergency Medicine

## 2016-02-10 ENCOUNTER — Encounter: Payer: Self-pay | Admitting: Emergency Medicine

## 2016-02-10 DIAGNOSIS — I252 Old myocardial infarction: Secondary | ICD-10-CM | POA: Insufficient documentation

## 2016-02-10 DIAGNOSIS — I110 Hypertensive heart disease with heart failure: Secondary | ICD-10-CM | POA: Insufficient documentation

## 2016-02-10 DIAGNOSIS — Y92019 Unspecified place in single-family (private) house as the place of occurrence of the external cause: Secondary | ICD-10-CM | POA: Insufficient documentation

## 2016-02-10 DIAGNOSIS — J449 Chronic obstructive pulmonary disease, unspecified: Secondary | ICD-10-CM | POA: Insufficient documentation

## 2016-02-10 DIAGNOSIS — F17200 Nicotine dependence, unspecified, uncomplicated: Secondary | ICD-10-CM | POA: Insufficient documentation

## 2016-02-10 DIAGNOSIS — Z79899 Other long term (current) drug therapy: Secondary | ICD-10-CM | POA: Insufficient documentation

## 2016-02-10 DIAGNOSIS — I2510 Atherosclerotic heart disease of native coronary artery without angina pectoris: Secondary | ICD-10-CM | POA: Diagnosis not present

## 2016-02-10 DIAGNOSIS — Y999 Unspecified external cause status: Secondary | ICD-10-CM | POA: Insufficient documentation

## 2016-02-10 DIAGNOSIS — I509 Heart failure, unspecified: Secondary | ICD-10-CM | POA: Insufficient documentation

## 2016-02-10 DIAGNOSIS — S0101XA Laceration without foreign body of scalp, initial encounter: Secondary | ICD-10-CM | POA: Insufficient documentation

## 2016-02-10 DIAGNOSIS — Y9301 Activity, walking, marching and hiking: Secondary | ICD-10-CM | POA: Diagnosis not present

## 2016-02-10 DIAGNOSIS — W228XXA Striking against or struck by other objects, initial encounter: Secondary | ICD-10-CM | POA: Insufficient documentation

## 2016-02-10 DIAGNOSIS — Z7982 Long term (current) use of aspirin: Secondary | ICD-10-CM | POA: Diagnosis not present

## 2016-02-10 MED ORDER — LIDOCAINE HCL (PF) 1 % IJ SOLN
15.0000 mL | Freq: Once | INTRAMUSCULAR | Status: AC
  Administered 2016-02-10: 15 mL
  Filled 2016-02-10: qty 15

## 2016-02-10 NOTE — ED Notes (Signed)
Pt is being belligerent yelling out if someone doesn't see me before 10 pm I leaving.  Pt appears intoxicated. Pt denies drinking ETOH tonight. John PA at the bedside now.

## 2016-02-10 NOTE — ED Notes (Signed)
Pa at the bedside at this time.

## 2016-02-10 NOTE — ED Notes (Signed)
Discharge instructions reviewed with patient. Questions fielded by this RN. Patient verbalizes understanding of instructions. Patient discharged home in stable condition per Cuthriell PA . No acute distress noted at time of discharge.   

## 2016-02-10 NOTE — ED Triage Notes (Signed)
Patient ambulatory to triage with steady gait, without difficulty or distress noted; pt reports PTA tripped hitting forehead on doorframe; denies LOC/HA/dizziness; denies any other c/o or injuries; lac noted to right side forehead with scant bleeding; saline soaked 4x4 applied

## 2016-02-10 NOTE — ED Provider Notes (Signed)
[facility] Emergency Department Provider Note  ____________________________________________  Time seen: Approximately 11:32 PM  I have reviewed the triage vital signs and the nursing notes.   HISTORY  Chief Complaint Ear Laceration    HPI Ian Herman is a 70 y.o. male who presents emergency department for a laceration to the right temporal region of the scalp. Patient states that he was walking through his house when his feet became entangled in some items on the floor and he stumbled forward striking his head on the corner of a doorway. Patient did not lose consciousness at any time. He denies headache. Patient is here for laceration repair. Patient denies any headache, visual changes, neck pain, nausea or vomiting.  Patient is slightly belligerent. EtOH ingestion was suspected, however patient denies any recent EtOH use and no EtOH is directed on patient's breath or clothing. Patient is alert and oriented 4. He answers all questions appropriately. Patient appears competent to make medical decisions on his own. Patient presents to the emergency department with a family member who reports that his actions are consistent with typical behavior. They deny any impairment.   Past Medical History:  Diagnosis Date  . Arthritis   . Bronchitis   . CHF (congestive heart failure) (HCC)   . COPD (chronic obstructive pulmonary disease) (HCC)   . Coronary artery disease   . Depression   . GERD (gastroesophageal reflux disease)   . Hepatitis C   . Hypertension   . Myocardial infarction acute (HCC)   . Pleural effusion   . PTSD (post-traumatic stress disorder)   . Shortness of breath dyspnea     There are no active problems to display for this patient.   Past Surgical History:  Procedure Laterality Date  . APPENDECTOMY    . CORONARY ANGIOPLASTY     stent  . INGUINAL HERNIA REPAIR Left 04/23/2015   Procedure: HERNIA REPAIR INGUINAL ADULT;  Surgeon: Nadeen Landau, MD;  Location: [facility] ORS;  Service: General;  Laterality: Left;    Prior to Admission medications   Medication Sig Start Date End Date Taking? Authorizing Provider  albuterol (PROVENTIL HFA;VENTOLIN HFA) 108 (90 BASE) MCG/ACT inhaler Inhale 2 puffs into the lungs every 6 (six) hours as needed. wheezing    Historical Provider, MD  aspirin EC 81 MG tablet Take 81 mg by mouth daily.    Historical Provider, MD  cycloSPORINE (RESTASIS) 0.05 % ophthalmic emulsion Place 1 drop into both eyes 2 (two) times daily.    Historical Provider, MD  Elbasvir-Grazoprevir (ZEPATIER) 50-100 MG TABS Take by mouth daily.    Historical Provider, MD  guaiFENesin (MUCINEX) 600 MG 12 hr tablet Take by mouth 2 (two) times daily as needed.    Historical Provider, MD  hydrALAZINE (APRESOLINE) 50 MG tablet Take 50 mg by mouth 2 (two) times daily.    Historical Provider, MD  HYDROcodone-acetaminophen (NORCO) 5-325 MG tablet Take 1-2 tablets by mouth every 4 (four) hours as needed for moderate pain. 04/23/15   Nadeen Landau, MD  lisinopril (PRINIVIL,ZESTRIL) 40 MG tablet Take 40 mg by mouth daily.    Historical Provider, MD  metoprolol succinate (TOPROL-XL) 25 MG 24 hr tablet Take 25 mg by mouth 2 (two) times daily.    Historical Provider, MD  mirtazapine (REMERON) 15 MG tablet Take 15 mg by mouth at bedtime.    Historical Provider, MD  omeprazole (PRILOSEC) 20 MG capsule Take 20 mg by mouth daily.    Historical Provider,  MD  simvastatin (ZOCOR) 20 MG tablet Take 20 mg by mouth every evening.    Historical Provider, MD    Allergies Codeine and Thorazine [chlorpromazine]  No family history on file.  Social History Social History  Substance Use Topics  . Smoking status: Current Every Day Smoker    Packs/day: 0.50  . Smokeless tobacco: Never Used  . Alcohol use Yes     Comment: Excessive, no alcohol in past month (04/23/15)     Review of Systems  Constitutional: No fever/chills Eyes: No visual  changes.  Cardiovascular: no chest pain. Respiratory: no cough. No SOB. Gastrointestinal:  No nausea, no vomiting.   Musculoskeletal: Negative for musculoskeletal pain. Skin: Positive for laceration to the right temporal region of the scalp. Neurological: Negative for headaches, focal weakness or numbness. 10-point ROS otherwise negative.  ____________________________________________   PHYSICAL EXAM:  VITAL SIGNS: ED Triage Vitals  Enc Vitals Group     BP 02/10/16 2120 (!) 165/92     Pulse Rate 02/10/16 2120 (!) 105     Resp 02/10/16 2120 18     Temp 02/10/16 2120 98.1 F (36.7 C)     Temp Source 02/10/16 2120 Oral     SpO2 02/10/16 2120 94 %     Weight 02/10/16 2120 129 lb (58.5 kg)     Height 02/10/16 2120 5\' 8"  (1.727 m)     Head Circumference --      Peak Flow --      Pain Score 02/10/16 2126 9     Pain Loc --      Pain Edu? --      Excl. in GC? --      Constitutional: Alert and oriented. Well appearing and in no acute distress. Eyes: Conjunctivae are normal. PERRL. EOMI. Head: Flap laceration is noted to the right temporal region of the scalp. This appears cuboid with each side approximately 2 cm in length. Edges are squared off to form cuboid shape flap. Edges are smooth and nature. Total length of laceration is approximately 6 cm. No bleeding. No visible foreign body. Patient is nontender to palpation over this region. No raccoon eyes. No battle signs. No serosanguineous fluid drainage from the ears or nares. ENT:      Ears:       Nose: No congestion/rhinnorhea.      Mouth/Throat: Mucous membranes are moist.  Neck: No stridor.  No cervical spine tenderness to palpation.  Cardiovascular: Normal rate, regular rhythm. Normal S1 and S2.  Good peripheral circulation. Respiratory: Normal respiratory effort without tachypnea or retractions. Lungs CTAB. Good air entry to the bases with no decreased or absent breath sounds. Musculoskeletal: Full range of motion to all  extremities. No gross deformities appreciated. Neurologic:  Normal speech and language. No gross focal neurologic deficits are appreciated. Cranial nerves II through XII are grossly intact. Patient is AAO 4. GCS of 15. Patient's Mini-Mental status exam reveals no deficiencies. Patient appears competent. Skin:  Skin is warm, dry and intact. No rash noted. Psychiatric: Patient is slightly belligerent and not aggressive.Marland Kitchen. Speech and behavior are normal. Patient exhibits appropriate insight and judgement.   ____________________________________________   LABS (all labs ordered are listed, but only abnormal results are displayed)  Labs Reviewed - No data to display ____________________________________________  EKG   ____________________________________________  RADIOLOGY   No results found.  ____________________________________________    PROCEDURES  Procedure(s) performed:    Marland Kitchen.Marland Kitchen.Laceration Repair Date/Time: 02/10/2016 11:32 PM Performed by: Gala RomneyUTHRIELL, JONATHAN D Authorized by:  Gala Romney D   Consent:    Consent obtained:  Verbal   Consent given by:  Patient   Risks discussed:  Infection and poor cosmetic result Anesthesia (see MAR for exact dosages):    Anesthesia method:  Local infiltration   Local anesthetic:  Lidocaine 1% w/o epi Laceration details:    Location:  Scalp   Scalp location:  R temporal   Length (cm):  6 Repair type:    Repair type:  Simple Exploration:    Hemostasis achieved with:  Direct pressure   Wound exploration: entire depth of wound probed and visualized     Contaminated: no   Treatment:    Area cleansed with:  Betadine   Amount of cleaning:  Standard   Irrigation solution:  Sterile saline   Irrigation method:  Syringe Skin repair:    Repair method:  Sutures   Suture size:  5-0   Suture material:  Nylon   Suture technique:  Simple interrupted   Number of sutures:  15 Approximation:    Approximation:  Close   Vermilion  border: well-aligned   Post-procedure details:    Dressing:  Open (no dressing)   Patient tolerance of procedure:  Tolerated well, no immediate complications      Medications  lidocaine (PF) (XYLOCAINE) 1 % injection 15 mL (15 mLs Infiltration Given by Other 02/10/16 2335)     ____________________________________________   INITIAL IMPRESSION / ASSESSMENT AND PLAN / ED COURSE  Pertinent labs & imaging results that were available during my care of the patient were reviewed by me and considered in my medical decision making (see chart for details).  Review of the [place] CSRS was performed in accordance of the [facility] prior to dispensing any controlled drugs.  Clinical Course    Patient's diagnosis is consistent with Laceration to the scalp. Patient presents emergency Department with a laceration to the right temporal region. Patient does appear belligerent and EtOH was initially suspected. However, patient denies any ingestion of mind altering substances to include alcohol or illicit drugs. EtOH is not smelled on patient's breath or clothing. Family member presents to the emergency department with patient and reports that patient's behavior is consistent with normal behavior. Patient was alert and oriented, able to answer all questions appropriately, appears competent to make medical decision making. Patient recalls entire event and denies any loss of consciousness. At this time there is no indication for intracranial abnormality with no headache, change in mentation, nausea or vomiting. Patient is offered CT scan but he declines at this time. Initially, patient requested only Steri-Strips or glue for wound closure. After informing patient that this was not optimal treatment and that he would have to sign a refusal of treatment form, patient consents to suturing of this laceration. Sutures are best option due to nature of injury and location to provide optimal closure as well as prevention of  infection. Patient tolerated procedure very well with no complications. See above note for procedure details. Patient may take Tylenol and Motrin as needed at home for any pain. Patient will follow-up with primary care in 1 week for suture removal. Patient is given ED precautions to return to the ED for any worsening or new symptoms.     ____________________________________________  FINAL CLINICAL IMPRESSION(S) / ED DIAGNOSES  Final diagnoses:  Scalp laceration, initial encounter      NEW MEDICATIONS STARTED DURING THIS VISIT:  Discharge Medication List as of 02/10/2016 11:34 PM  This chart was dictated using voice recognition software/Dragon. Despite best efforts to proofread, errors can occur which can change the meaning. Any change was purely unintentional.    Racheal Patches, PA-C 02/10/16 2343    Rockne Menghini, MD 02/20/16 1610

## 2016-02-10 NOTE — ED Notes (Addendum)
Pt has a responsible party at the bedside. Pt states he will let John PA put in sutures.

## 2016-02-10 NOTE — ED Notes (Signed)
Pt is being belligerent yelling out if someone doesn't see me. I going to leave get my papers.

## 2016-06-25 ENCOUNTER — Encounter: Payer: Self-pay | Admitting: Emergency Medicine

## 2016-06-25 ENCOUNTER — Emergency Department
Admission: EM | Admit: 2016-06-25 | Discharge: 2016-06-25 | Disposition: A | Discharge status: Other Acute Inpatient Hospital | Payer: Non-veteran care | Attending: Emergency Medicine | Admitting: Emergency Medicine

## 2016-06-25 ENCOUNTER — Emergency Department: Payer: Non-veteran care

## 2016-06-25 DIAGNOSIS — I110 Hypertensive heart disease with heart failure: Secondary | ICD-10-CM | POA: Insufficient documentation

## 2016-06-25 DIAGNOSIS — Z7982 Long term (current) use of aspirin: Secondary | ICD-10-CM | POA: Diagnosis not present

## 2016-06-25 DIAGNOSIS — F17200 Nicotine dependence, unspecified, uncomplicated: Secondary | ICD-10-CM | POA: Insufficient documentation

## 2016-06-25 DIAGNOSIS — R0602 Shortness of breath: Secondary | ICD-10-CM | POA: Diagnosis present

## 2016-06-25 DIAGNOSIS — I509 Heart failure, unspecified: Secondary | ICD-10-CM | POA: Diagnosis not present

## 2016-06-25 DIAGNOSIS — Z79899 Other long term (current) drug therapy: Secondary | ICD-10-CM | POA: Diagnosis not present

## 2016-06-25 DIAGNOSIS — I2510 Atherosclerotic heart disease of native coronary artery without angina pectoris: Secondary | ICD-10-CM | POA: Diagnosis not present

## 2016-06-25 DIAGNOSIS — J441 Chronic obstructive pulmonary disease with (acute) exacerbation: Secondary | ICD-10-CM | POA: Insufficient documentation

## 2016-06-25 LAB — BASIC METABOLIC PANEL
Anion gap: 6 (ref 5–15)
BUN: 20 mg/dL (ref 6–20)
CALCIUM: 9.3 mg/dL (ref 8.9–10.3)
CHLORIDE: 105 mmol/L (ref 101–111)
CO2: 28 mmol/L (ref 22–32)
CREATININE: 1.24 mg/dL (ref 0.61–1.24)
GFR calc Af Amer: >60 mL/min (ref >60)
GFR calc non Af Amer: 58 mL/min — ABNORMAL LOW (ref >60)
GLUCOSE: 110 mg/dL — AB (ref 65–99)
Potassium: 4.5 mmol/L (ref 3.5–5.1)
Sodium: 139 mmol/L (ref 135–145)

## 2016-06-25 LAB — CBC
HCT: 40.3 % (ref 40.0–52.0)
Hemoglobin: 14 g/dL (ref 13.0–18.0)
MCH: 31.8 pg (ref 26.0–34.0)
MCHC: 34.8 g/dL (ref 32.0–36.0)
MCV: 91.6 fL (ref 80.0–100.0)
Platelets: 187 10*3/uL (ref 150–440)
RBC: 4.4 MIL/uL (ref 4.40–5.90)
RDW: 14 % (ref 11.5–14.5)
WBC: 8.9 10*3/uL (ref 3.8–10.6)

## 2016-06-25 LAB — INFLUENZA PANEL BY PCR (TYPE A & B)
Influenza A By PCR: NEGATIVE
Influenza B By PCR: NEGATIVE

## 2016-06-25 LAB — TROPONIN I

## 2016-06-25 MED ORDER — IPRATROPIUM-ALBUTEROL 0.5-2.5 (3) MG/3ML IN SOLN
3.0000 mL | Freq: Once | RESPIRATORY_TRACT | Status: AC
  Administered 2016-06-25: 3 mL via RESPIRATORY_TRACT

## 2016-06-25 MED ORDER — IPRATROPIUM-ALBUTEROL 0.5-2.5 (3) MG/3ML IN SOLN
RESPIRATORY_TRACT | Status: AC
  Administered 2016-06-25: 3 mL via RESPIRATORY_TRACT
  Filled 2016-06-25: qty 6

## 2016-06-25 MED ORDER — ALBUTEROL SULFATE (2.5 MG/3ML) 0.083% IN NEBU: 5.0000 mg | INHALATION_SOLUTION | Freq: Once | RESPIRATORY_TRACT | Status: DC

## 2016-06-25 MED ORDER — METHYLPREDNISOLONE SODIUM SUCC 125 MG IJ SOLR
125.0000 mg | Freq: Once | INTRAMUSCULAR | Status: AC
  Administered 2016-06-25: 125 mg via INTRAVENOUS

## 2016-06-25 MED ORDER — METHYLPREDNISOLONE SODIUM SUCC 125 MG IJ SOLR
INTRAMUSCULAR | Status: AC
  Administered 2016-06-25: 125 mg via INTRAVENOUS
  Filled 2016-06-25: qty 2

## 2016-06-25 NOTE — ED Notes (Addendum)
[facility] Pt NAD at this time.

## 2016-06-25 NOTE — ED Notes (Signed)
Pt was given a blanket.

## 2016-06-25 NOTE — ED Notes (Signed)
[facility].

## 2016-06-25 NOTE — ED Notes (Signed)
Patient resting quietly at this time.  Voices no complaints.  Call bell at bedside.

## 2016-06-25 NOTE — ED Notes (Signed)
[facility], given to [place].

## 2016-06-25 NOTE — ED Provider Notes (Signed)
[facility] Emergency Department Provider Note __   First MD Initiated Contact with Patient 06/25/16 [phone]     (approximate)  I have reviewed the triage vital signs and the nursing notes.   HISTORY  Chief Complaint Shortness of Breath    HPI Ian Herman is a 71 y.o. male with below list of chronic medical conditions including congestive heart failure and COPD presents to the emergency department with acute onset of difficulty breathing last night. Patient states that symptoms have progressively worsened since onset. Patient denies any fever does admit to nonproductive cough. Patient denies any chest pain no diaphoresis no nausea or vomiting.   Past Medical History:  Diagnosis Date  . Arthritis   . Bronchitis   . CHF (congestive heart failure) (HCC)   . COPD (chronic obstructive pulmonary disease) (HCC)   . Coronary artery disease   . Depression   . GERD (gastroesophageal reflux disease)   . Hepatitis C   . Hypertension   . Myocardial infarction acute   . Pleural effusion   . PTSD (post-traumatic stress disorder)   . Shortness of breath dyspnea     There are no active problems to display for this patient.   Past Surgical History:  Procedure Laterality Date  . APPENDECTOMY    . CORONARY ANGIOPLASTY     stent  . INGUINAL HERNIA REPAIR Left 04/23/2015   Procedure: HERNIA REPAIR INGUINAL ADULT;  Surgeon: Nadeen Landau, MD;  Location: [facility] ORS;  Service: General;  Laterality: Left;    Prior to Admission medications   Medication Sig Start Date End Date Taking? Authorizing Provider  albuterol (PROVENTIL HFA;VENTOLIN HFA) 108 (90 BASE) MCG/ACT inhaler Inhale 2 puffs into the lungs every 6 (six) hours as needed. wheezing    Historical Provider, MD  aspirin EC 81 MG tablet Take 81 mg by mouth daily.    Historical Provider, MD  cycloSPORINE (RESTASIS) 0.05 % ophthalmic emulsion Place 1 drop into both eyes 2 (two) times daily.    Historical  Provider, MD  Elbasvir-Grazoprevir (ZEPATIER) 50-100 MG TABS Take by mouth daily.    Historical Provider, MD  guaiFENesin (MUCINEX) 600 MG 12 hr tablet Take by mouth 2 (two) times daily as needed.    Historical Provider, MD  hydrALAZINE (APRESOLINE) 50 MG tablet Take 50 mg by mouth 2 (two) times daily.    Historical Provider, MD  HYDROcodone-acetaminophen (NORCO) 5-325 MG tablet Take 1-2 tablets by mouth every 4 (four) hours as needed for moderate pain. 04/23/15   Nadeen Landau, MD  lisinopril (PRINIVIL,ZESTRIL) 40 MG tablet Take 40 mg by mouth daily.    Historical Provider, MD  metoprolol succinate (TOPROL-XL) 25 MG 24 hr tablet Take 25 mg by mouth 2 (two) times daily.    Historical Provider, MD  mirtazapine (REMERON) 15 MG tablet Take 15 mg by mouth at bedtime.    Historical Provider, MD  omeprazole (PRILOSEC) 20 MG capsule Take 20 mg by mouth daily.    Historical Provider, MD  simvastatin (ZOCOR) 20 MG tablet Take 20 mg by mouth every evening.    Historical Provider, MD    Allergies Codeine and Thorazine [chlorpromazine]  No family history on file.  Social History Social History  Substance Use Topics  . Smoking status: Current Every Day Smoker    Packs/day: 0.50  . Smokeless tobacco: Never Used  . Alcohol use Yes     Comment: Excessive, no alcohol in past month (04/23/15)    Review  of Systems Constitutional: No fever/chills Eyes: No visual changes. ENT: No sore throat. Cardiovascular: Denies chest pain. Respiratory:Positive for shortness of breath. Gastrointestinal: No abdominal pain.  No nausea, no vomiting.  No diarrhea.  No constipation. Genitourinary: Negative for dysuria. Musculoskeletal: Negative for back pain. Skin: Negative for rash. Neurological: Negative for headaches, focal weakness or numbness.  10-point ROS otherwise negative.  ____________________________________________   PHYSICAL EXAM:  VITAL SIGNS: ED Triage Vitals  Enc Vitals Group     BP  06/25/16 0210 (!) 180/96     Pulse Rate 06/25/16 0207 86     Resp 06/25/16 0207 (!) 32     Temp --      Temp src --      SpO2 06/25/16 0207 100 %     Weight 06/25/16 0207 125 lb (56.7 kg)     Height 06/25/16 0207 5\' 8"  (1.727 m)     Head Circumference --      Peak Flow --      Pain Score --      Pain Loc --      Pain Edu? --      Excl. in GC? --     Constitutional: Alert and oriented. Apparent respiratory distress Eyes: Conjunctivae are normal. PERRL. EOMI. Head: Atraumatic. Mouth/Throat: Mucous membranes are moist.  Oropharynx non-erythematous. Neck: No stridor.  Cardiovascular: Normal rate, regular rhythm. Good peripheral circulation. Grossly normal heart sounds. Respiratory: Tachypnea, positive respiratory accessory muscle usage diffuse rhonchi/wheezes  Gastrointestinal: Soft and nontender. No distention.  Musculoskeletal: No lower extremity tenderness nor edema. No gross deformities of extremities. Neurologic:  Normal speech and language. No gross focal neurologic deficits are appreciated.  Skin:  Skin is warm, dry and intact. No rash noted. Psychiatric: Mood and affect are normal. Speech and behavior are normal.  ____________________________________________   LABS (all labs ordered are listed, but only abnormal results are displayed)  Labs Reviewed  BASIC METABOLIC PANEL - Abnormal; Notable for the following:       Result Value   Glucose, Bld 110 (*)    GFR calc non Af Amer 58 (*)    All other components within normal limits  CBC  TROPONIN I  INFLUENZA PANEL BY PCR (TYPE A & B, H1N1)   ____________________________________________  EKG  ED ECG REPORT I, [place] N Juliya Magill, the attending physician, personally viewed and interpreted this ECG.   Date: 06/25/2016  EKG Time: 2:07 AM  Rate: 87  Rhythm: Normal sinus rhythm  Axis: Normal  Intervals: Normal  ST&T Change: None ___________________________________  RADIOLOGY I, [place] N Eulan Heyward, personally viewed and  evaluated these images (plain radiographs) as part of my medical decision making, as well as reviewing the written report by the radiologist.  Dg Chest Port 1 View  Result Date: 06/25/2016 CLINICAL DATA:  Acute onset of shortness of breath. Initial encounter. EXAM: PORTABLE CHEST 1 VIEW COMPARISON:  Chest radiograph performed 10/18/2011 FINDINGS: The lungs are hyperexpanded, with flattening of the hemidiaphragms, compatible with COPD. There is no evidence of focal opacification, pleural effusion or pneumothorax. The cardiomediastinal silhouette is within normal limits. No acute osseous abnormalities are seen. IMPRESSION: Findings of COPD.  No acute cardiopulmonary process seen. Electronically Signed   By: Roanna RaiderJeffery  Chang M.D.   On: 06/25/2016 02:40    ____________________________________________    Procedures   Critical Care performed: CRITICAL CARE Performed by: Darci CurrentANDOLPH N Yoshiharu Brassell   Total critical care time: 40 minutes  Critical care time was exclusive of separately billable procedures and  treating other patients.  Critical care was necessary to treat or prevent imminent or life-threatening deterioration.  Critical care was time spent personally by me on the following activities: development of treatment plan with patient and/or surrogate as well as nursing, discussions with consultants, evaluation of patient's response to treatment, examination of patient, obtaining history from patient or surrogate, ordering and performing treatments and interventions, ordering and review of laboratory studies, ordering and review of radiographic studies, pulse oximetry and re-evaluation of patient's condition.  ____________________________________________   INITIAL IMPRESSION / ASSESSMENT AND PLAN / ED COURSE  Pertinent labs & imaging results that were available during my care of the patient were reviewed by me and considered in my medical decision making (see chart for details).  71 year old male  presenting with rest or distress D a EMS. History of physical exam concerning for possible COPD exacerbation as such patient received 2 DuoNeb's and 125 mg Solu-Medrol  ----------------------------------------- 5:47 AM on 06/25/2016 ----------------------------------------- patient states rest her status improved however despite 4 L [place] cannula oxygen patient only satting 92%. As such patient requires hospital admission for continued management of COPD exacerbation. Patient discussed with Dr. Edgar Frisk physician at the [facility] hospital who accepted the patient in transfer. Clinical Course     ____________________________________________  FINAL CLINICAL IMPRESSION(S) / ED DIAGNOSES  Final diagnoses:  COPD exacerbation (HCC)     MEDICATIONS GIVEN DURING THIS VISIT:  Medications  ipratropium-albuterol (DUONEB) 0.5-2.5 (3) MG/3ML nebulizer solution 3 mL (3 mLs Nebulization Given 06/25/16 0221)  ipratropium-albuterol (DUONEB) 0.5-2.5 (3) MG/3ML nebulizer solution 3 mL (3 mLs Nebulization Given 06/25/16 0224)  methylPREDNISolone sodium succinate (SOLU-MEDROL) 125 mg/2 mL injection 125 mg (125 mg Intravenous Given 06/25/16 0222)     NEW OUTPATIENT MEDICATIONS STARTED DURING THIS VISIT:  New Prescriptions   No medications on file    Modified Medications   No medications on file    Discontinued Medications   No medications on file     Note:  This document was prepared using Dragon voice recognition software and may include unintentional dictation errors.    Darci Current, MD 06/25/16 [phone]

## 2016-06-25 NOTE — ED Triage Notes (Signed)
Patient reports feeling short of breath for several hours.  Patient reports similar to when he had MI in 2006 (reports only symptoms was shortness of breath).

## 2016-10-30 ENCOUNTER — Encounter: Payer: Self-pay | Admitting: *Deleted

## 2016-10-30 ENCOUNTER — Emergency Department: Payer: No Typology Code available for payment source

## 2016-10-30 ENCOUNTER — Emergency Department
Admission: EM | Admit: 2016-10-30 | Discharge: 2016-10-30 | Disposition: A | Discharge status: Home / Self Care | Payer: No Typology Code available for payment source | Attending: Emergency Medicine | Admitting: Emergency Medicine

## 2016-10-30 DIAGNOSIS — F17210 Nicotine dependence, cigarettes, uncomplicated: Secondary | ICD-10-CM | POA: Diagnosis not present

## 2016-10-30 DIAGNOSIS — S161XXA Strain of muscle, fascia and tendon at neck level, initial encounter: Secondary | ICD-10-CM | POA: Insufficient documentation

## 2016-10-30 DIAGNOSIS — I2510 Atherosclerotic heart disease of native coronary artery without angina pectoris: Secondary | ICD-10-CM | POA: Insufficient documentation

## 2016-10-30 DIAGNOSIS — J449 Chronic obstructive pulmonary disease, unspecified: Secondary | ICD-10-CM | POA: Diagnosis not present

## 2016-10-30 DIAGNOSIS — Y92410 Unspecified street and highway as the place of occurrence of the external cause: Secondary | ICD-10-CM | POA: Insufficient documentation

## 2016-10-30 DIAGNOSIS — Z79899 Other long term (current) drug therapy: Secondary | ICD-10-CM | POA: Insufficient documentation

## 2016-10-30 DIAGNOSIS — Z7982 Long term (current) use of aspirin: Secondary | ICD-10-CM | POA: Insufficient documentation

## 2016-10-30 DIAGNOSIS — Y999 Unspecified external cause status: Secondary | ICD-10-CM | POA: Diagnosis not present

## 2016-10-30 DIAGNOSIS — Y939 Activity, unspecified: Secondary | ICD-10-CM | POA: Insufficient documentation

## 2016-10-30 DIAGNOSIS — I252 Old myocardial infarction: Secondary | ICD-10-CM | POA: Insufficient documentation

## 2016-10-30 DIAGNOSIS — S39012A Strain of muscle, fascia and tendon of lower back, initial encounter: Secondary | ICD-10-CM | POA: Diagnosis not present

## 2016-10-30 DIAGNOSIS — I110 Hypertensive heart disease with heart failure: Secondary | ICD-10-CM | POA: Diagnosis not present

## 2016-10-30 DIAGNOSIS — I509 Heart failure, unspecified: Secondary | ICD-10-CM | POA: Insufficient documentation

## 2016-10-30 DIAGNOSIS — I672 Cerebral atherosclerosis: Secondary | ICD-10-CM | POA: Diagnosis not present

## 2016-10-30 DIAGNOSIS — S199XXA Unspecified injury of neck, initial encounter: Secondary | ICD-10-CM | POA: Diagnosis present

## 2016-10-30 MED ORDER — ACETAMINOPHEN 500 MG PO TABS
1000.0000 mg | ORAL_TABLET | Freq: Once | ORAL | Status: AC
  Administered 2016-10-30: 1000 mg via ORAL
  Filled 2016-10-30: qty 2

## 2016-10-30 MED ORDER — OXYCODONE HCL 5 MG PO TABS
5.0000 mg | ORAL_TABLET | Freq: Once | ORAL | Status: AC
  Administered 2016-10-30: 5 mg via ORAL
  Filled 2016-10-30: qty 1

## 2016-10-30 MED ORDER — TRAMADOL HCL 50 MG PO TABS: 50.0000 mg | ORAL_TABLET | Freq: Four times a day (QID) | ORAL | 0 refills | Status: DC | PRN
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
## 2016-10-30 NOTE — ED Notes (Signed)
Dr. Don PerkingVeronese removed C-collar, remains at bedside performing ultrasound exam.

## 2016-10-30 NOTE — ED Notes (Signed)
Patient ambulated in room with steady gait. Patient dressed self independently.
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
## 2016-11-13 ENCOUNTER — Encounter: Payer: Self-pay | Admitting: Emergency Medicine

## 2016-11-13 ENCOUNTER — Emergency Department
Admission: EM | Admit: 2016-11-13 | Discharge: 2016-11-13 | Disposition: A | Discharge status: Home / Self Care | Payer: No Typology Code available for payment source | Attending: Student in an Organized Health Care Education/Training Program | Admitting: Student in an Organized Health Care Education/Training Program

## 2016-11-13 DIAGNOSIS — Z79899 Other long term (current) drug therapy: Secondary | ICD-10-CM | POA: Diagnosis not present

## 2016-11-13 DIAGNOSIS — I509 Heart failure, unspecified: Secondary | ICD-10-CM | POA: Insufficient documentation

## 2016-11-13 DIAGNOSIS — Z7982 Long term (current) use of aspirin: Secondary | ICD-10-CM | POA: Insufficient documentation

## 2016-11-13 DIAGNOSIS — J449 Chronic obstructive pulmonary disease, unspecified: Secondary | ICD-10-CM | POA: Insufficient documentation

## 2016-11-13 DIAGNOSIS — F17200 Nicotine dependence, unspecified, uncomplicated: Secondary | ICD-10-CM | POA: Diagnosis not present

## 2016-11-13 DIAGNOSIS — I110 Hypertensive heart disease with heart failure: Secondary | ICD-10-CM | POA: Diagnosis not present

## 2016-11-13 DIAGNOSIS — S3992XD Unspecified injury of lower back, subsequent encounter: Secondary | ICD-10-CM | POA: Diagnosis present

## 2016-11-13 DIAGNOSIS — M5030 Other cervical disc degeneration, unspecified cervical region: Secondary | ICD-10-CM | POA: Insufficient documentation

## 2016-11-13 DIAGNOSIS — M545 Low back pain, unspecified: Secondary | ICD-10-CM

## 2016-11-13 DIAGNOSIS — I2510 Atherosclerotic heart disease of native coronary artery without angina pectoris: Secondary | ICD-10-CM | POA: Insufficient documentation

## 2016-11-13 DIAGNOSIS — M5137 Other intervertebral disc degeneration, lumbosacral region: Secondary | ICD-10-CM | POA: Insufficient documentation

## 2016-11-13 MED ORDER — TRAMADOL HCL 50 MG PO TABS: 50.0000 mg | ORAL_TABLET | Freq: Two times a day (BID) | ORAL | 0 refills | Status: DC

## 2016-11-13 MED ORDER — CYCLOBENZAPRINE HCL 5 MG PO TABS: 5.0000 mg | ORAL_TABLET | Freq: Two times a day (BID) | ORAL | 0 refills | Status: DC | PRN
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
## 2016-11-13 NOTE — Discharge Instructions (Signed)
Your exam is normal today. Continue to dose Tylenol as well as the Ultram for pain relief. Follow-up with the [facility] for continued symptoms.
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
## 2018-01-02 ENCOUNTER — Emergency Department
Admission: EM | Admit: 2018-01-02 | Discharge: 2018-01-02 | Disposition: A | Discharge status: Home / Self Care | Payer: No Typology Code available for payment source | Attending: Emergency Medicine | Admitting: Emergency Medicine

## 2018-01-02 ENCOUNTER — Encounter: Payer: Self-pay | Admitting: Emergency Medicine

## 2018-01-02 DIAGNOSIS — Y929 Unspecified place or not applicable: Secondary | ICD-10-CM | POA: Diagnosis not present

## 2018-01-02 DIAGNOSIS — J449 Chronic obstructive pulmonary disease, unspecified: Secondary | ICD-10-CM | POA: Diagnosis not present

## 2018-01-02 DIAGNOSIS — S199XXA Unspecified injury of neck, initial encounter: Secondary | ICD-10-CM | POA: Diagnosis present

## 2018-01-02 DIAGNOSIS — Z7982 Long term (current) use of aspirin: Secondary | ICD-10-CM | POA: Insufficient documentation

## 2018-01-02 DIAGNOSIS — Y999 Unspecified external cause status: Secondary | ICD-10-CM | POA: Insufficient documentation

## 2018-01-02 DIAGNOSIS — Z79899 Other long term (current) drug therapy: Secondary | ICD-10-CM | POA: Insufficient documentation

## 2018-01-02 DIAGNOSIS — I110 Hypertensive heart disease with heart failure: Secondary | ICD-10-CM | POA: Insufficient documentation

## 2018-01-02 DIAGNOSIS — S161XXA Strain of muscle, fascia and tendon at neck level, initial encounter: Secondary | ICD-10-CM

## 2018-01-02 DIAGNOSIS — F17200 Nicotine dependence, unspecified, uncomplicated: Secondary | ICD-10-CM | POA: Diagnosis not present

## 2018-01-02 DIAGNOSIS — I2510 Atherosclerotic heart disease of native coronary artery without angina pectoris: Secondary | ICD-10-CM | POA: Diagnosis not present

## 2018-01-02 DIAGNOSIS — I509 Heart failure, unspecified: Secondary | ICD-10-CM | POA: Diagnosis not present

## 2018-01-02 DIAGNOSIS — Y939 Activity, unspecified: Secondary | ICD-10-CM | POA: Insufficient documentation

## 2018-01-02 MED ORDER — TRAMADOL HCL 50 MG PO TABS: 50.0000 mg | ORAL_TABLET | Freq: Three times a day (TID) | ORAL | 0 refills | Status: AC | PRN

## 2018-01-02 MED ORDER — CYCLOBENZAPRINE HCL 10 MG PO TABS
5.0000 mg | ORAL_TABLET | Freq: Once | ORAL | Status: AC
  Administered 2018-01-02: 5 mg via ORAL
  Filled 2018-01-02: qty 1

## 2018-01-02 MED ORDER — HYDROCODONE-ACETAMINOPHEN 5-325 MG PO TABS
1.0000 | ORAL_TABLET | Freq: Once | ORAL | Status: AC
  Administered 2018-01-02: 1 via ORAL
  Filled 2018-01-02: qty 1

## 2018-01-02 MED ORDER — CYCLOBENZAPRINE HCL 5 MG PO TABS: 5.0000 mg | ORAL_TABLET | Freq: Three times a day (TID) | ORAL | 0 refills | Status: DC | PRN

## 2018-01-02 NOTE — Discharge Instructions (Signed)
Your exam and reviewed CT scans are negative at this time. You should take the prescription meds as directed. Apply ice and moist heat to reduce muscle pain and spasms. Work to [employer] out [employer] the collar this week. Return to the ED as needed.
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
## 2018-01-11 ENCOUNTER — Ambulatory Visit: Payer: Non-veteran care | Admitting: Podiatry

## 2018-01-22 ENCOUNTER — Ambulatory Visit: Payer: Non-veteran care | Admitting: Podiatry

## 2018-01-24 ENCOUNTER — Other Ambulatory Visit: Payer: Self-pay

## 2018-01-24 ENCOUNTER — Emergency Department
Admission: EM | Admit: 2018-01-24 | Discharge: 2018-01-24 | Disposition: A | Discharge status: Home / Self Care | Payer: No Typology Code available for payment source | Attending: Emergency Medicine | Admitting: Emergency Medicine

## 2018-01-24 DIAGNOSIS — S199XXA Unspecified injury of neck, initial encounter: Secondary | ICD-10-CM | POA: Diagnosis present

## 2018-01-24 DIAGNOSIS — S161XXA Strain of muscle, fascia and tendon at neck level, initial encounter: Secondary | ICD-10-CM | POA: Insufficient documentation

## 2018-01-24 DIAGNOSIS — Y998 Other external cause status: Secondary | ICD-10-CM | POA: Insufficient documentation

## 2018-01-24 DIAGNOSIS — Y929 Unspecified place or not applicable: Secondary | ICD-10-CM | POA: Insufficient documentation

## 2018-01-24 DIAGNOSIS — Y9389 Activity, other specified: Secondary | ICD-10-CM | POA: Diagnosis not present

## 2018-01-24 MED ORDER — LIDOCAINE 5 % EX PTCH: 1.0000 | MEDICATED_PATCH | CUTANEOUS | 0 refills | Status: DC
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
## 2018-02-12 ENCOUNTER — Ambulatory Visit: Payer: Non-veteran care | Admitting: Podiatry

## 2018-02-19 ENCOUNTER — Encounter: Payer: Non-veteran care | Admitting: Podiatry

## 2018-02-26 NOTE — Progress Notes (Signed)
This encounter was created in error - please disregard.

## 2018-03-19 ENCOUNTER — Encounter: Payer: Non-veteran care | Admitting: Podiatry

## 2018-03-19 ENCOUNTER — Ambulatory Visit: Payer: Non-veteran care

## 2018-03-27 NOTE — Progress Notes (Signed)
This encounter was created in error - please disregard.

## 2018-04-02 ENCOUNTER — Ambulatory Visit (INDEPENDENT_AMBULATORY_CARE_PROVIDER_SITE_OTHER): Payer: No Typology Code available for payment source | Admitting: Podiatry

## 2018-04-02 ENCOUNTER — Encounter: Payer: Self-pay | Admitting: Podiatry

## 2018-04-02 VITALS — BP 155/92 | HR 92

## 2018-04-02 DIAGNOSIS — B351 Tinea unguium: Secondary | ICD-10-CM

## 2018-04-02 DIAGNOSIS — M79676 Pain in unspecified toe(s): Secondary | ICD-10-CM | POA: Diagnosis not present

## 2018-04-02 DIAGNOSIS — Z79899 Other long term (current) drug therapy: Secondary | ICD-10-CM | POA: Diagnosis not present

## 2018-04-03 LAB — HEPATIC FUNCTION PANEL
ALBUMIN: 4.6 g/dL (ref 3.5–4.8)
ALT: 7 IU/L (ref 0–44)
AST: 8 IU/L (ref 0–40)
Alkaline Phosphatase: 74 IU/L (ref 39–117)
Bilirubin Total: 0.6 mg/dL (ref 0.0–1.2)
Bilirubin, Direct: 0.18 mg/dL (ref 0.00–0.40)
TOTAL PROTEIN: 6.9 g/dL (ref 6.0–8.5)

## 2018-04-05 ENCOUNTER — Telehealth: Payer: Self-pay | Admitting: Podiatry

## 2018-04-05 NOTE — Telephone Encounter (Signed)
Ian Herman from [facility] called requesting notes from visit on 08 October. Fax number is [phone] and phone number is [phone] x [phone]. Thank you.

## 2018-04-07 NOTE — Progress Notes (Signed)
   Subjective: 72 year old male presenting today as a new patient with a chief complaint of possible nail fungus of the bilateral feet that has been ongoing for the past several years. He reports associated thickening and discoloration of the nails. He has tried trimming the nails and applying antifungal creams with no significant relief. He denies modifying factors. Patient is here for further evaluation and treatment.   Past Medical History:  Diagnosis Date  . Arthritis   . Bronchitis   . CHF (congestive heart failure) (HCC)   . COPD (chronic obstructive pulmonary disease) (HCC)   . Coronary artery disease   . Depression   . GERD (gastroesophageal reflux disease)   . Hepatitis C   . Hypertension   . Myocardial infarction acute (HCC)   . Pleural effusion   . PTSD (post-traumatic stress disorder)   . Shortness of breath dyspnea     Objective: Physical Exam General: The patient is alert and oriented x3 in no acute distress.  Dermatology: Hyperkeratotic, discolored, thickened, onychodystrophy of nails noted bilaterally. Skin is warm, dry and supple bilateral lower extremities. Negative for open lesions or macerations.  Vascular: Palpable pedal pulses bilaterally. No edema or erythema noted. Capillary refill within normal limits.  Neurological: Epicritic and protective threshold grossly intact bilaterally.   Musculoskeletal Exam: Range of motion within normal limits to all pedal and ankle joints bilateral. Muscle strength 5/5 in all groups bilateral.   Assessment: #1 onychomycosis nails 1-5 bilateral  #2 hyperkeratotic nails bilateral #3 h/o hepatitis C x 45 years - cured   Plan of Care:  #1 Patient was evaluated. #2 Mechanical debridement of nails 1-5 bilaterally performed using a nail nipper. Filed with dremel without incident.  #3 Orders for liver function test placed today. If normal we will call in prescription for Lamisil 250 mg #90.  #4 Office will contact patient  regarding LFT results.  #5 Return to clinic as needed.    Felecia Shelling, DPM [facility]  Dr. Felecia Shelling, DPM    [address]                                        [place], [place] [address]                Office [phone]  Fax [phone]

## 2018-04-09 ENCOUNTER — Other Ambulatory Visit: Payer: Self-pay | Admitting: Podiatry

## 2018-04-09 MED ORDER — TERBINAFINE HCL 250 MG PO TABS: 250.0000 mg | ORAL_TABLET | Freq: Every day | ORAL | 0 refills | Status: DC

## 2018-05-06 ENCOUNTER — Telehealth: Payer: Self-pay | Admitting: Podiatry

## 2018-05-06 MED ORDER — TERBINAFINE HCL 250 MG PO TABS: 250.0000 mg | ORAL_TABLET | Freq: Every day | ORAL | 0 refills | Status: DC

## 2018-05-06 NOTE — Addendum Note (Signed)
Addended by: Geraldine ContrasVENABLE, ANGELA D on: 05/06/2018 04:30 PM   Modules accepted: Orders

## 2018-05-06 NOTE — Telephone Encounter (Signed)
Rx has been faxed to [facility]

## 2018-05-06 NOTE — Telephone Encounter (Signed)
Patient was seen 10/22 and prescribed a medication for toenail fungus. [facility] not [facility]. Can you fax over the prescription and give pt a call.  Fax# [phone]

## 2018-06-13 ENCOUNTER — Telehealth: Payer: Self-pay | Admitting: *Deleted

## 2018-06-13 NOTE — Telephone Encounter (Signed)
Ian Herman [facility] states pt called the pharmacy and was informed the prescription had not been faxed to them, please fax prescription to [phone].

## 2018-06-20 ENCOUNTER — Telehealth: Payer: Self-pay | Admitting: Podiatry

## 2018-06-20 MED ORDER — TERBINAFINE HCL 250 MG PO TABS: 250.0000 mg | ORAL_TABLET | Freq: Every day | ORAL | 0 refills | Status: DC

## 2018-06-20 NOTE — Telephone Encounter (Signed)
I'm calling to give a new fax number for the pharmacy at the [place]. Please call me and I will give you the fax number.

## 2018-06-20 NOTE — Telephone Encounter (Signed)
Patient notified of script via voice mail

## 2018-06-20 NOTE — Telephone Encounter (Signed)
Please re-submit Lamisil medication to [facility]   Fax Number [phone]

## 2018-06-20 NOTE — Telephone Encounter (Signed)
Script has been reprinted and faxed to [facility] pharmacy at [phone]

## 2018-06-20 NOTE — Addendum Note (Signed)
Addended by: Geraldine ContrasVENABLE, Mame Twombly D on: 06/20/2018 02:17 PM   Modules accepted: Orders

## 2018-07-02 ENCOUNTER — Encounter: Payer: Non-veteran care | Admitting: Podiatry

## 2018-07-14 NOTE — Progress Notes (Signed)
This encounter was created in error - please disregard.

## 2018-08-02 ENCOUNTER — Emergency Department: Payer: Medicare Other

## 2018-08-02 ENCOUNTER — Inpatient Hospital Stay
Admission: EM | Admit: 2018-08-02 | Discharge: 2018-08-07 | DRG: 190 | Disposition: A | Discharge status: Home Health | Payer: Medicare Other | Attending: Internal Medicine | Admitting: Internal Medicine

## 2018-08-02 ENCOUNTER — Other Ambulatory Visit: Payer: Self-pay

## 2018-08-02 ENCOUNTER — Encounter: Payer: Self-pay | Admitting: Emergency Medicine

## 2018-08-02 DIAGNOSIS — Z681 Body mass index (BMI) 19 or less, adult: Secondary | ICD-10-CM

## 2018-08-02 DIAGNOSIS — I509 Heart failure, unspecified: Secondary | ICD-10-CM | POA: Diagnosis present

## 2018-08-02 DIAGNOSIS — F4310 Post-traumatic stress disorder, unspecified: Secondary | ICD-10-CM | POA: Diagnosis present

## 2018-08-02 DIAGNOSIS — F17200 Nicotine dependence, unspecified, uncomplicated: Secondary | ICD-10-CM | POA: Diagnosis not present

## 2018-08-02 DIAGNOSIS — B182 Chronic viral hepatitis C: Secondary | ICD-10-CM | POA: Diagnosis present

## 2018-08-02 DIAGNOSIS — J9621 Acute and chronic respiratory failure with hypoxia: Secondary | ICD-10-CM | POA: Diagnosis present

## 2018-08-02 DIAGNOSIS — Z955 Presence of coronary angioplasty implant and graft: Secondary | ICD-10-CM

## 2018-08-02 DIAGNOSIS — J441 Chronic obstructive pulmonary disease with (acute) exacerbation: Secondary | ICD-10-CM | POA: Diagnosis present

## 2018-08-02 DIAGNOSIS — E785 Hyperlipidemia, unspecified: Secondary | ICD-10-CM | POA: Diagnosis present

## 2018-08-02 DIAGNOSIS — I2510 Atherosclerotic heart disease of native coronary artery without angina pectoris: Secondary | ICD-10-CM | POA: Diagnosis present

## 2018-08-02 DIAGNOSIS — F17210 Nicotine dependence, cigarettes, uncomplicated: Secondary | ICD-10-CM | POA: Diagnosis present

## 2018-08-02 DIAGNOSIS — E43 Unspecified severe protein-calorie malnutrition: Secondary | ICD-10-CM | POA: Diagnosis present

## 2018-08-02 DIAGNOSIS — I110 Hypertensive heart disease with heart failure: Secondary | ICD-10-CM | POA: Diagnosis present

## 2018-08-02 DIAGNOSIS — Z7982 Long term (current) use of aspirin: Secondary | ICD-10-CM

## 2018-08-02 DIAGNOSIS — J9622 Acute and chronic respiratory failure with hypercapnia: Secondary | ICD-10-CM | POA: Diagnosis present

## 2018-08-02 DIAGNOSIS — K219 Gastro-esophageal reflux disease without esophagitis: Secondary | ICD-10-CM | POA: Diagnosis present

## 2018-08-02 DIAGNOSIS — Z79899 Other long term (current) drug therapy: Secondary | ICD-10-CM | POA: Diagnosis not present

## 2018-08-02 DIAGNOSIS — F411 Generalized anxiety disorder: Secondary | ICD-10-CM

## 2018-08-02 DIAGNOSIS — Z515 Encounter for palliative care: Secondary | ICD-10-CM | POA: Diagnosis not present

## 2018-08-02 DIAGNOSIS — I252 Old myocardial infarction: Secondary | ICD-10-CM | POA: Diagnosis not present

## 2018-08-02 DIAGNOSIS — Z7189 Other specified counseling: Secondary | ICD-10-CM

## 2018-08-02 LAB — COMPREHENSIVE METABOLIC PANEL
ALT: 13 U/L (ref 0–44)
ANION GAP: 4 — AB (ref 5–15)
AST: 17 U/L (ref 15–41)
Albumin: 4.1 g/dL (ref 3.5–5.0)
Alkaline Phosphatase: 74 U/L (ref 38–126)
BUN: 19 mg/dL (ref 8–23)
CO2: 30 mmol/L (ref 22–32)
Calcium: 8.6 mg/dL — ABNORMAL LOW (ref 8.9–10.3)
Chloride: 102 mmol/L (ref 98–111)
Creatinine, Ser: 0.74 mg/dL (ref 0.61–1.24)
GFR calc Af Amer: >60 mL/min (ref >60)
GFR calc non Af Amer: >60 mL/min (ref >60)
Glucose, Bld: 110 mg/dL — ABNORMAL HIGH (ref 70–99)
Potassium: 4.3 mmol/L (ref 3.5–5.1)
Sodium: 136 mmol/L (ref 135–145)
Total Bilirubin: 0.4 mg/dL (ref 0.3–1.2)
Total Protein: 7.6 g/dL (ref 6.5–8.1)

## 2018-08-02 LAB — BLOOD GAS, VENOUS
ACID-BASE EXCESS: 4.8 mmol/L — AB (ref 0.0–2.0)
Bicarbonate: 33.5 mmol/L — ABNORMAL HIGH (ref 20.0–28.0)
O2 Saturation: 54 %
PH VEN: 7.3 (ref 7.250–7.430)
Patient temperature: 37
pCO2, Ven: 68 mmHg — ABNORMAL HIGH (ref 44.0–60.0)
pO2, Ven: 32 mmHg (ref 32.0–45.0)

## 2018-08-02 LAB — CBC WITH DIFFERENTIAL/PLATELET
Abs Immature Granulocytes: 0.02 10*3/uL (ref 0.00–0.07)
Basophils Absolute: 0 10*3/uL (ref 0.0–0.1)
Basophils Relative: 0 %
EOS PCT: 2 %
Eosinophils Absolute: 0.2 10*3/uL (ref 0.0–0.5)
HCT: 39.9 % (ref 39.0–52.0)
Hemoglobin: 12.8 g/dL — ABNORMAL LOW (ref 13.0–17.0)
Immature Granulocytes: 0 %
LYMPHS PCT: 8 %
Lymphs Abs: 0.7 10*3/uL (ref 0.7–4.0)
MCH: 30.1 pg (ref 26.0–34.0)
MCHC: 32.1 g/dL (ref 30.0–36.0)
MCV: 93.9 fL (ref 80.0–100.0)
Monocytes Absolute: 0.5 10*3/uL (ref 0.1–1.0)
Monocytes Relative: 6 %
Neutro Abs: 7 10*3/uL (ref 1.7–7.7)
Neutrophils Relative %: 84 %
Platelets: 226 10*3/uL (ref 150–400)
RBC: 4.25 MIL/uL (ref 4.22–5.81)
RDW: 13.6 % (ref 11.5–15.5)
WBC: 8.4 10*3/uL (ref 4.0–10.5)
nRBC: 0 % (ref 0.0–0.2)

## 2018-08-02 LAB — MRSA PCR SCREENING: MRSA by PCR: NEGATIVE

## 2018-08-02 LAB — GLUCOSE, CAPILLARY: Glucose-Capillary: 153 mg/dL — ABNORMAL HIGH (ref 70–99)

## 2018-08-02 LAB — BRAIN NATRIURETIC PEPTIDE: B Natriuretic Peptide: 71 pg/mL (ref 0.0–100.0)

## 2018-08-02 LAB — TROPONIN I: Troponin I: <0.03 ng/mL (ref <0.03)

## 2018-08-02 LAB — TSH: TSH: 1.216 u[IU]/mL (ref 0.350–4.500)

## 2018-08-02 MED ORDER — SODIUM CHLORIDE 0.9 % IV SOLN
1.0000 g | Freq: Every day | INTRAVENOUS | Status: AC
  Administered 2018-08-02 – 2018-08-06 (×5): 1 g via INTRAVENOUS
  Filled 2018-08-02 (×5): qty 1

## 2018-08-02 MED ORDER — ALBUTEROL SULFATE (2.5 MG/3ML) 0.083% IN NEBU
2.5000 mg | INHALATION_SOLUTION | RESPIRATORY_TRACT | Status: DC | PRN
  Administered 2018-08-02 – 2018-08-04 (×3): 2.5 mg via RESPIRATORY_TRACT
  Filled 2018-08-02 (×3): qty 3

## 2018-08-02 MED ORDER — PANTOPRAZOLE SODIUM 40 MG PO TBEC
40.0000 mg | DELAYED_RELEASE_TABLET | Freq: Every day | ORAL | Status: DC
  Administered 2018-08-03 – 2018-08-07 (×5): 40 mg via ORAL
  Filled 2018-08-02 (×5): qty 1

## 2018-08-02 MED ORDER — LISINOPRIL 20 MG PO TABS
40.0000 mg | ORAL_TABLET | Freq: Every day | ORAL | Status: DC
  Administered 2018-08-03 – 2018-08-07 (×5): 40 mg via ORAL
  Filled 2018-08-02 (×5): qty 2

## 2018-08-02 MED ORDER — IPRATROPIUM-ALBUTEROL 0.5-2.5 (3) MG/3ML IN SOLN
3.0000 mL | Freq: Once | RESPIRATORY_TRACT | Status: AC
  Administered 2018-08-02: 3 mL via RESPIRATORY_TRACT

## 2018-08-02 MED ORDER — IPRATROPIUM-ALBUTEROL 0.5-2.5 (3) MG/3ML IN SOLN
3.0000 mL | Freq: Four times a day (QID) | RESPIRATORY_TRACT | Status: DC
  Administered 2018-08-02 – 2018-08-07 (×22): 3 mL via RESPIRATORY_TRACT
  Filled 2018-08-02 (×23): qty 3

## 2018-08-02 MED ORDER — ENSURE ENLIVE PO LIQD
237.0000 mL | Freq: Three times a day (TID) | ORAL | Status: DC
  Administered 2018-08-03 – 2018-08-07 (×12): 237 mL via ORAL

## 2018-08-02 MED ORDER — METHYLPREDNISOLONE SODIUM SUCC 40 MG IJ SOLR
40.0000 mg | Freq: Two times a day (BID) | INTRAMUSCULAR | Status: AC
  Administered 2018-08-02 – 2018-08-04 (×5): 40 mg via INTRAVENOUS
  Filled 2018-08-02 (×5): qty 1

## 2018-08-02 MED ORDER — ENOXAPARIN SODIUM 40 MG/0.4ML ~~LOC~~ SOLN
40.0000 mg | SUBCUTANEOUS | Status: DC
  Administered 2018-08-03 – 2018-08-07 (×5): 40 mg via SUBCUTANEOUS
  Filled 2018-08-02 (×5): qty 0.4

## 2018-08-02 MED ORDER — GUAIFENESIN ER 600 MG PO TB12: 600.0000 mg | ORAL_TABLET | Freq: Two times a day (BID) | ORAL | Status: DC | PRN

## 2018-08-02 MED ORDER — ACETAMINOPHEN 325 MG PO TABS: 650.0000 mg | ORAL_TABLET | Freq: Four times a day (QID) | ORAL | Status: DC | PRN

## 2018-08-02 MED ORDER — ENSURE ENLIVE PO LIQD: 237.0000 mL | Freq: Two times a day (BID) | ORAL | Status: DC

## 2018-08-02 MED ORDER — SODIUM CHLORIDE 0.9 % IV SOLN
INTRAVENOUS | Status: DC
  Administered 2018-08-02: 10:00:00 via INTRAVENOUS

## 2018-08-02 MED ORDER — SIMVASTATIN 20 MG PO TABS
20.0000 mg | ORAL_TABLET | Freq: Every evening | ORAL | Status: DC
  Administered 2018-08-03 – 2018-08-06 (×4): 20 mg via ORAL
  Filled 2018-08-02 (×4): qty 1

## 2018-08-02 MED ORDER — ONDANSETRON HCL 4 MG PO TABS: 4.0000 mg | ORAL_TABLET | Freq: Four times a day (QID) | ORAL | Status: DC | PRN

## 2018-08-02 MED ORDER — MIRTAZAPINE 15 MG PO TABS
15.0000 mg | ORAL_TABLET | Freq: Every day | ORAL | Status: DC
  Administered 2018-08-02 – 2018-08-06 (×5): 15 mg via ORAL
  Filled 2018-08-02 (×5): qty 1

## 2018-08-02 MED ORDER — IPRATROPIUM-ALBUTEROL 0.5-2.5 (3) MG/3ML IN SOLN
RESPIRATORY_TRACT | Status: AC
  Administered 2018-08-02: 3 mL via RESPIRATORY_TRACT
  Filled 2018-08-02: qty 9

## 2018-08-02 MED ORDER — METHYLPREDNISOLONE SODIUM SUCC 125 MG IJ SOLR
60.0000 mg | Freq: Four times a day (QID) | INTRAMUSCULAR | Status: DC
  Administered 2018-08-02: 60 mg via INTRAVENOUS

## 2018-08-02 MED ORDER — LACTATED RINGERS IV SOLN: INTRAVENOUS | Status: DC

## 2018-08-02 MED ORDER — ENOXAPARIN SODIUM 40 MG/0.4ML ~~LOC~~ SOLN
40.0000 mg | SUBCUTANEOUS | Status: DC
  Administered 2018-08-02: 40 mg via SUBCUTANEOUS

## 2018-08-02 MED ORDER — HYDRALAZINE HCL 50 MG PO TABS
50.0000 mg | ORAL_TABLET | Freq: Two times a day (BID) | ORAL | Status: DC
  Administered 2018-08-02 – 2018-08-07 (×10): 50 mg via ORAL
  Filled 2018-08-02 (×10): qty 1

## 2018-08-02 MED ORDER — PREDNISONE 20 MG PO TABS: 50.0000 mg | ORAL_TABLET | Freq: Every day | ORAL | Status: DC

## 2018-08-02 MED ORDER — METOPROLOL SUCCINATE ER 25 MG PO TB24
25.0000 mg | ORAL_TABLET | Freq: Two times a day (BID) | ORAL | Status: DC
  Administered 2018-08-02 – 2018-08-07 (×11): 25 mg via ORAL
  Filled 2018-08-02 (×11): qty 1

## 2018-08-02 MED ORDER — GUAIFENESIN ER 600 MG PO TB12: 600.0000 mg | ORAL_TABLET | Freq: Two times a day (BID) | ORAL | Status: DC

## 2018-08-02 MED ORDER — METHYLPREDNISOLONE SODIUM SUCC 125 MG IJ SOLR
125.0000 mg | Freq: Once | INTRAMUSCULAR | Status: AC
  Administered 2018-08-02: 125 mg via INTRAVENOUS

## 2018-08-02 MED ORDER — METHYLPREDNISOLONE SODIUM SUCC 125 MG IJ SOLR
INTRAMUSCULAR | Status: AC
  Administered 2018-08-02: 125 mg via INTRAVENOUS
  Filled 2018-08-02: qty 2

## 2018-08-02 MED ORDER — BUDESONIDE 0.5 MG/2ML IN SUSP
0.5000 mg | Freq: Two times a day (BID) | RESPIRATORY_TRACT | Status: DC
  Administered 2018-08-02 – 2018-08-07 (×11): 0.5 mg via RESPIRATORY_TRACT
  Filled 2018-08-02 (×11): qty 2

## 2018-08-02 MED ORDER — MAGNESIUM SULFATE 2 GM/50ML IV SOLN
2.0000 g | Freq: Once | INTRAVENOUS | Status: AC
  Administered 2018-08-02: 2 g via INTRAVENOUS

## 2018-08-02 MED ORDER — ONDANSETRON HCL 4 MG/2ML IJ SOLN: 4.0000 mg | Freq: Four times a day (QID) | INTRAMUSCULAR | Status: DC | PRN

## 2018-08-02 MED ORDER — DOCUSATE SODIUM 100 MG PO CAPS
100.0000 mg | ORAL_CAPSULE | Freq: Two times a day (BID) | ORAL | Status: DC
  Administered 2018-08-02 – 2018-08-07 (×10): 100 mg via ORAL
  Filled 2018-08-02 (×10): qty 1

## 2018-08-02 MED ORDER — CYCLOSPORINE 0.05 % OP EMUL
1.0000 [drp] | Freq: Two times a day (BID) | OPHTHALMIC | Status: DC
  Administered 2018-08-02 – 2018-08-07 (×11): 1 [drp] via OPHTHALMIC
  Filled 2018-08-02 (×13): qty 30

## 2018-08-02 MED ORDER — ACETAMINOPHEN 650 MG RE SUPP: 650.0000 mg | Freq: Four times a day (QID) | RECTAL | Status: DC | PRN

## 2018-08-02 MED ORDER — ADULT MULTIVITAMIN W/MINERALS CH
1.0000 | ORAL_TABLET | Freq: Every day | ORAL | Status: DC
  Administered 2018-08-03 – 2018-08-07 (×5): 1 via ORAL
  Filled 2018-08-02 (×5): qty 1

## 2018-08-02 MED ORDER — ASPIRIN EC 81 MG PO TBEC
81.0000 mg | DELAYED_RELEASE_TABLET | Freq: Every day | ORAL | Status: DC
  Administered 2018-08-03 – 2018-08-07 (×5): 81 mg via ORAL
  Filled 2018-08-02 (×5): qty 1

## 2018-08-02 MED ORDER — ORAL CARE MOUTH RINSE
15.0000 mL | Freq: Two times a day (BID) | OROMUCOSAL | Status: DC
  Administered 2018-08-06: 15 mL via OROMUCOSAL

## 2018-08-02 MED ORDER — CYCLOBENZAPRINE HCL 10 MG PO TABS
5.0000 mg | ORAL_TABLET | Freq: Three times a day (TID) | ORAL | Status: DC | PRN
  Filled 2018-08-02: qty 0.5

## 2018-08-02 MED ORDER — CHLORHEXIDINE GLUCONATE 0.12 % MT SOLN
15.0000 mL | Freq: Two times a day (BID) | OROMUCOSAL | Status: DC
  Administered 2018-08-03 – 2018-08-07 (×6): 15 mL via OROMUCOSAL
  Filled 2018-08-02 (×8): qty 15

## 2018-08-02 MED ORDER — MAGNESIUM SULFATE 2 GM/50ML IV SOLN
INTRAVENOUS | Status: AC
  Administered 2018-08-02: 2 g via INTRAVENOUS
  Filled 2018-08-02: qty 50

## 2018-08-02 MED ORDER — ALBUTEROL SULFATE (2.5 MG/3ML) 0.083% IN NEBU: 2.5000 mg | INHALATION_SOLUTION | RESPIRATORY_TRACT | Status: DC | PRN

## 2018-08-02 MED ORDER — GUAIFENESIN ER 600 MG PO TB12
600.0000 mg | ORAL_TABLET | Freq: Two times a day (BID) | ORAL | Status: DC | PRN
  Administered 2018-08-03 – 2018-08-06 (×3): 600 mg via ORAL
  Filled 2018-08-02 (×3): qty 1

## 2018-08-02 NOTE — ED Notes (Signed)
MD Forbach at bedside. 

## 2018-08-02 NOTE — Progress Notes (Signed)
Initial Nutrition Assessment  DOCUMENTATION CODES:   Severe malnutrition in context of chronic illness  INTERVENTION:   Ensure Enlive po TID, each supplement provides 350 kcal and 20 grams of protein  Magic cup TID with meals, each supplement provides 290 kcal and 9 grams of protein  MVI daily   Recommend liberalize diet  Suspect pt at moderate refeed risk; recommend monitor K, Mg and P labs daily until stable    NUTRITION DIAGNOSIS:   Severe Malnutrition related to chronic illness(COPD) as evidenced by severe fat depletion, severe muscle depletion.  GOAL:   Patient will meet greater than or equal to 90% of their needs  MONITOR:   PO intake, Supplement acceptance, Labs, Weight trends, Skin, I & O's  REASON FOR ASSESSMENT:   Consult Assessment of nutrition requirement/status  ASSESSMENT:   73 y.o. male with medical history of COPD w/o home O2, current smoker (4-5 cigs/day), Coronary artery disease w/ 2 stents, hypertension, presents to ICU with worsening shortness of breath on BiPAP.   Met with pt in room today. Pt with Bipap on so RD obtained only limited history from pt. Pt provides thumbs up when asked if his appetite is good at home. Pt also provides thumbs up when asked if he likes Ensure. Pt reports eating some breakfast this morning. Per chart, pt appears fairly weight stable pta. RD will add supplements and MVI to help pt meet his estimated needs. Recommend liberalize diet as a heart healthy diet is restrictive of protein. Pt with severe muscle and fat depletions; pt meets criteria for severe chronic malnutrition. Pt likely at moderate refeed risk; recommend monitor K, Mg and P labs daily until stable.   Medications reviewed and include: aspirin, colace, lovenox, remeron, protonix, ceftriaxone   Labs reviewed: K 4.3 wnl  NUTRITION - FOCUSED PHYSICAL EXAM:    Most Recent Value  Orbital Region  Severe depletion  Upper Arm Region  Severe depletion  Thoracic and  Lumbar Region  Severe depletion  Buccal Region  Severe depletion  Temple Region  Severe depletion  Clavicle Bone Region  Severe depletion  Clavicle and Acromion Bone Region  Severe depletion  Scapular Bone Region  Severe depletion  Dorsal Hand  Moderate depletion  Patellar Region  Severe depletion  Anterior Thigh Region  Severe depletion  Posterior Calf Region  Severe depletion  Edema (RD Assessment)  None  Hair  Reviewed  Eyes  Reviewed  Mouth  Reviewed  Skin  Reviewed  Nails  Reviewed     Diet Order:   Diet Order            Diet Heart Room service appropriate? Yes; Fluid consistency: Thin  Diet effective now             EDUCATION NEEDS:   Education needs have been addressed  Skin:  Skin Assessment: Reviewed RN Assessment  Last BM:  2/19  Height:   Ht Readings from Last 1 Encounters:  08/02/18 '5\' 8"'  (1.727 m)    Weight:   Wt Readings from Last 1 Encounters:  08/02/18 54.4 kg    Ideal Body Weight:  70 kg  BMI:  Body mass index is 18.25 kg/m.  Estimated Nutritional Needs:   Kcal:  1800-2000kcal/day   Protein:  87-97g/day   Fluid:  1.4L/day   Koleen Distance MS, RD, LDN Pager #- [phone] Office#- [phone] After Hours Pager: [phone]

## 2018-08-02 NOTE — ED Provider Notes (Signed)
[facility] Emergency Department Provider Note  ____________________________________________   First MD Initiated Contact with Patient 08/02/18 0140     (approximate)  I have reviewed the triage vital signs and the nursing notes.   HISTORY  Chief Complaint Respiratory Distress  Level 5 caveat:  history/ROS limited by acute/critical illness  HPI Ian Herman is a 73 y.o. male with severe COPD as well as a history of MI and CHF.  He presents by private vehicle for evaluation of shortness of breath.  He says it is been going on for several days and gradually getting worse and tonight is severe.  He has been using his nebulizer treatments at home but says he cannot get up and go to the bathroom without having to use another nebulizer.  He is not sure if he has been having any fevers or chills.  He denies chest pain and abdominal pain as well as nausea and vomiting.  He cannot catch his breath and exertion makes it worse.  When he got to the emergency department his oxygen level was in the 70s and he was brought immediately back to a room and put on nonrebreather.  Past Medical History:  Diagnosis Date  . Arthritis   . Bronchitis   . CHF (congestive heart failure) (HCC)   . COPD (chronic obstructive pulmonary disease) (HCC)   . Coronary artery disease   . Depression   . GERD (gastroesophageal reflux disease)   . Hepatitis C   . Hypertension   . Myocardial infarction acute (HCC)   . Pleural effusion   . PTSD (post-traumatic stress disorder)   . Shortness of breath dyspnea     There are no active problems to display for this patient.   Past Surgical History:  Procedure Laterality Date  . APPENDECTOMY    . CORONARY ANGIOPLASTY     stent  . INGUINAL HERNIA REPAIR Left 04/23/2015   Procedure: HERNIA REPAIR INGUINAL ADULT;  Surgeon: Nadeen Landau, MD;  Location: [facility] ORS;  Service: General;  Laterality: Left;    Prior to Admission medications    Medication Sig Start Date End Date Taking? Authorizing Provider  albuterol (PROVENTIL HFA;VENTOLIN HFA) 108 (90 BASE) MCG/ACT inhaler Inhale 2 puffs into the lungs every 6 (six) hours as needed. wheezing   Yes [provider]  aspirin EC 81 MG tablet Take 81 mg by mouth daily.   Yes [provider]  cyclobenzaprine (FLEXERIL) 5 MG tablet Take 1 tablet (5 mg total) by mouth 3 (three) times daily as needed for muscle spasms. 01/02/18  Yes Menshew, Charlesetta Ivory, PA-C  cycloSPORINE (RESTASIS) 0.05 % ophthalmic emulsion Place 1 drop into both eyes 2 (two) times daily.   Yes [provider]  etodolac (LODINE) 500 MG tablet Take by mouth.   Yes [provider]  guaiFENesin (MUCINEX) 600 MG 12 hr tablet Take by mouth 2 (two) times daily as needed.   Yes [provider]  hydrALAZINE (APRESOLINE) 50 MG tablet Take 50 mg by mouth 2 (two) times daily.   Yes [provider]  lisinopril (PRINIVIL,ZESTRIL) 40 MG tablet Take 40 mg by mouth daily.   Yes [provider]  metoprolol succinate (TOPROL-XL) 25 MG 24 hr tablet Take 25 mg by mouth 2 (two) times daily.   Yes [provider]  mirtazapine (REMERON) 15 MG tablet Take 15 mg by mouth at bedtime.   Yes [provider]  omeprazole (PRILOSEC) 20 MG capsule Take  20 mg by mouth daily.   Yes [provider]  simvastatin (ZOCOR) 20 MG tablet Take 20 mg by mouth every evening.   Yes [provider]  terbinafine (LAMISIL) 250 MG tablet Take 1 tablet (250 mg total) by mouth daily. Patient not taking: Reported on 08/02/2018 06/20/18   Felecia Shelling, DPM    Allergies Codeine and Thorazine [chlorpromazine]  No family history on file.  Social History Social History   Tobacco Use  . Smoking status: Current Every Day Smoker    Packs/day: 0.50  . Smokeless tobacco: Never Used  Substance Use Topics  . Alcohol use: Yes    Comment: occasionally per patient  . Drug  use: No    Comment: IV drug use history mult. years ago.    Review of Systems Level 5 caveat:  history/ROS limited by acute/critical illness   Severe shortness of breath, no chest pain, no abdominal pain, no nausea/vomiting.  ____________________________________________   PHYSICAL EXAM:  VITAL SIGNS: ED Triage Vitals  Enc Vitals Group     BP 08/02/18 0200 (!) 161/96     Pulse Rate 08/02/18 0144 (!) 101     Resp 08/02/18 0136 (!) 36     Temp --      Temp src --      SpO2 08/02/18 0135 100 %     Weight 08/02/18 0135 54.4 kg (120 lb)     Height 08/02/18 0135 1.727 m (5\' 8" )     Head Circumference --      Peak Flow --      Pain Score 08/02/18 0135 0     Pain Loc --      Pain Edu? --      Excl. in GC? --     Constitutional: Alert and oriented.  Severe respiratory distress. Eyes: Conjunctivae are normal.  Head: Atraumatic. Nose: No congestion/rhinnorhea. Mouth/Throat: Mucous membranes are dry. Neck: No stridor.  No meningeal signs.   Cardiovascular: Mild tachycardia, regular rhythm. Good peripheral circulation. Grossly normal heart sounds. Respiratory: Greatly increased respiratory effort with intercostal muscle retractions and accessory muscle usage.  Minimal air movement with some mild wheezing throughout but the wheezing is likely mild due to the minimal air movement.  Able to speak only a few words at a time. Gastrointestinal: Cachectic body habitus.  Soft and nontender. No distention.  Musculoskeletal: No lower extremity tenderness nor edema. No gross deformities of extremities. Neurologic:  Normal speech and language. No gross focal neurologic deficits are appreciated.  Skin:  Skin is warm, dry and intact. No rash noted. Psychiatric: Mood and affect are normal. Speech and behavior are normal.  ____________________________________________   LABS (all labs ordered are listed, but only abnormal results are displayed)  Labs Reviewed  CBC WITH DIFFERENTIAL/PLATELET -  Abnormal; Notable for the following components:      Result Value   Hemoglobin 12.8 (*)    All other components within normal limits  COMPREHENSIVE METABOLIC PANEL - Abnormal; Notable for the following components:   Glucose, Bld 110 (*)    Calcium 8.6 (*)    Anion gap 4 (*)    All other components within normal limits  BLOOD GAS, VENOUS - Abnormal; Notable for the following components:   pCO2, Ven 68 (*)    Bicarbonate 33.5 (*)    Acid-Base Excess 4.8 (*)    All other components within normal limits  TROPONIN I  BRAIN NATRIURETIC PEPTIDE   ____________________________________________  EKG  ED ECG REPORT I,  Loleta Roseory Korin Hartwell, the attending physician, personally viewed and interpreted this ECG.  Date: 08/02/2018 EKG Time: 1:35 AM Rate: 104 Rhythm: Sinus tachycardia QRS Axis: normal Intervals: normal ST/T Wave abnormalities: Non-specific ST segment / T-wave changes, but no clear evidence of acute ischemia. Narrative Interpretation: no definitive evidence of acute ischemia; does not meet STEMI criteria.   ____________________________________________  RADIOLOGY I, Loleta Roseory Jaqwan Wieber, personally viewed and evaluated these images (plain radiographs) as part of my medical decision making, as well as reviewing the written report by the radiologist.  ED MD interpretation: No evidence of pneumonia, hyperinflation consistent with emphysema  Official radiology report(s): Dg Chest Portable 1 View  Result Date: 08/02/2018 CLINICAL DATA:  Shortness of breath EXAM: PORTABLE CHEST 1 VIEW COMPARISON:  06/25/2016 FINDINGS: Hyperinflation with emphysematous disease. No acute consolidation or effusion. Normal heart size. Aortic atherosclerosis. No pneumothorax. IMPRESSION: No active disease.  Hyperinflation with emphysematous disease Electronically Signed   By: Jasmine PangKim  Fujinaga M.D.   On: 08/02/2018 01:54    ____________________________________________   PROCEDURES  Critical Care performed: Yes, see  critical care procedure note(s)   Procedure(s) performed:   .Critical Care Performed by: Loleta RoseForbach, Georgian Mcclory, MD Authorized by: Loleta RoseForbach, Uriyah Massimo, MD   Critical care provider statement:    Critical care time (minutes):  30   Critical care time was exclusive of:  Separately billable procedures and treating other patients   Critical care was necessary to treat or prevent imminent or life-threatening deterioration of the following conditions:  Respiratory failure   Critical care was time spent personally by me on the following activities:  Development of treatment plan with patient or surrogate, discussions with consultants, evaluation of patient's response to treatment, examination of patient, obtaining history from patient or surrogate, ordering and performing treatments and interventions, ordering and review of laboratory studies, ordering and review of radiographic studies, pulse oximetry, re-evaluation of patient's condition and review of old charts     ____________________________________________   INITIAL IMPRESSION / ASSESSMENT AND PLAN / ED COURSE  As part of my medical decision making, I reviewed the following data within the electronic MEDICAL RECORD NUMBER Nursing notes reviewed and incorporated, Labs reviewed , EKG interpreted , Old chart reviewed, Radiograph reviewed , Discussed with admitting physician  and Notes from prior ED visits    Differential diagnosis includes, but is not limited to, COPD exacerbation with acute on chronic respiratory failure and hypoxemia, pneumonia, pneumothorax, CHF exacerbation, ACS, PE, influenza.  The patient required emergent application of BiPAP which almost immediately started to make him feel better.  I am giving him 3 duo nebs in line with the BiPAP.  He is also getting Solu-Medrol 125 mg IV, magnesium 2 g IV, and we are checking lab work.  VBG was notable for a PCO2 of 68, normal CBC, negative troponin, and normal comprehensive metabolic panel.  The  patient will require admission for severe exacerbation and will likely require persistent albuterol treatments along with the BiPAP.  He agrees with the plan.  Clinical Course as of Aug 02 302  Fri Aug 02, 2018  0232 Discussed case with Dr. Sheryle Haildiamond with the hospitalist service just now and he will admit.  The patient is tolerating the BiPAP well.   [CF]    Clinical Course User Index [CF] Loleta RoseForbach, Brodie Correll, MD    ____________________________________________  FINAL CLINICAL IMPRESSION(S) / ED DIAGNOSES  Final diagnoses:  COPD exacerbation (HCC)  Acute on chronic respiratory failure with hypoxemia (HCC)     MEDICATIONS GIVEN DURING THIS VISIT:  Medications  methylPREDNISolone sodium succinate (SOLU-MEDROL) 125 mg/2 mL injection 125 mg (125 mg Intravenous Given 08/02/18 0135)  magnesium sulfate IVPB 2 g 50 mL (2 g Intravenous New Bag/Given 08/02/18 0137)  ipratropium-albuterol (DUONEB) 0.5-2.5 (3) MG/3ML nebulizer solution 3 mL (3 mLs Nebulization Given 08/02/18 0135)  ipratropium-albuterol (DUONEB) 0.5-2.5 (3) MG/3ML nebulizer solution 3 mL (3 mLs Nebulization Given 08/02/18 0135)  ipratropium-albuterol (DUONEB) 0.5-2.5 (3) MG/3ML nebulizer solution 3 mL (3 mLs Nebulization Given 08/02/18 0135)     ED Discharge Orders    None       Note:  This document was prepared using Dragon voice recognition software and may include unintentional dictation errors.   Loleta RoseForbach, Brinna Divelbiss, MD 08/02/18 [phone]

## 2018-08-02 NOTE — ED Notes (Signed)
RT at bedside to place pt on bipap

## 2018-08-02 NOTE — Consult Note (Signed)
Name: Ian Herman MRN: [id number] DOB: June 29, 1945     CONSULTATION DATE: 08/02/2018  REFERRING MD :  Joycelyn Rua  CHIEF COMPLAINT:  Shortness of breath  SIGNIFICANT EVENTS/STUDIES:  2/21 - Placed on BiPAP, CXR showed no active disease, +hyperinflation and severe emphysema 2/21 - Transferred to ICU on BiPAP 2/21 - Off BiPAP, on room air, SpO2 95% for ~30 mins 2/21 - Increased work of breathing, breathing treatment given -> back on BiPAP  HISTORY OF PRESENT ILLNESS:  Patient is a 73 y.o. male with medical history of COPD w/o home O2, current smoker (4-5 cigs/day), Coronary artery disease w/ 2 stents. Hypertension, presents to ICU with worsening shortness of breath on BiPAP. Patient reports feeling shortness of breath for several days, productive cough with green sputum, denies fever. He reports home nebulizer did not help, and last smoke 2 days ago. Arrived at ED via himself, was placed on BiPAP due to increased work of breathing. Chest X-ray shows lung hyperinflation and emphysema without active disease process. Pt is then transferred to ICU for acute respiratory distress. Was off BiPAP for about 30 minutes with SpO2 of 95%, but pt began to show increased work of breathing. Breathing treatment given and pt is placed on BiPAP.  Limited activities at home due to SOB    PAST MEDICAL HISTORY :  Past Medical History:  Diagnosis Date  . Arthritis   . Bronchitis   . CHF (congestive heart failure) (HCC)   . COPD (chronic obstructive pulmonary disease) (HCC)   . Coronary artery disease   . Depression   . GERD (gastroesophageal reflux disease)   . Hepatitis C   . Hypertension   . Myocardial infarction acute (HCC)   . Pleural effusion   . PTSD (post-traumatic stress disorder)   . Shortness of breath dyspnea     Prior to Admission medications   Medication Sig Start Date End Date Taking? Authorizing Provider  albuterol (PROVENTIL HFA;VENTOLIN HFA) 108 (90 BASE) MCG/ACT inhaler  Inhale 2 puffs into the lungs every 6 (six) hours as needed. wheezing   Yes [provider]  aspirin EC 81 MG tablet Take 81 mg by mouth daily.   Yes [provider]  cyclobenzaprine (FLEXERIL) 5 MG tablet Take 1 tablet (5 mg total) by mouth 3 (three) times daily as needed for muscle spasms. 01/02/18  Yes Menshew, Charlesetta Ivory, PA-C  cycloSPORINE (RESTASIS) 0.05 % ophthalmic emulsion Place 1 drop into both eyes 2 (two) times daily.   Yes [provider]  etodolac (LODINE) 500 MG tablet Take by mouth.   Yes [provider]  guaiFENesin (MUCINEX) 600 MG 12 hr tablet Take by mouth 2 (two) times daily as needed.   Yes [provider]  hydrALAZINE (APRESOLINE) 50 MG tablet Take 50 mg by mouth 2 (two) times daily.   Yes [provider]  lisinopril (PRINIVIL,ZESTRIL) 40 MG tablet Take 40 mg by mouth daily.   Yes [provider]  metoprolol succinate (TOPROL-XL) 25 MG 24 hr tablet Take 25 mg by mouth 2 (two) times daily.   Yes [provider]  mirtazapine (REMERON) 15 MG tablet Take 15 mg by mouth at bedtime.   Yes [provider]  omeprazole (PRILOSEC) 20 MG capsule Take 20 mg by mouth daily.   Yes [provider]  simvastatin (ZOCOR) 20 MG tablet Take 20 mg by mouth every evening.   Yes [provider]   Allergies  Allergen Reactions  . Codeine Itching  itching  . Thorazine [Chlorpromazine] Hives    Hives     FAMILY HISTORY:  family history is not on file. SOCIAL HISTORY:  reports that he has been smoking. He has been smoking about 0.50 packs per day. He has never used smokeless tobacco. He reports current alcohol use. He reports that he does not use drugs.  REVIEW OF SYSTEMS:   Review of Systems  Constitutional: Positive for weight loss (25lb wt loss over 5 years). Negative for chills and fever.  Eyes: Negative for blurred vision.  Respiratory: Positive for cough, sputum production (Green),  shortness of breath and wheezing. Negative for hemoptysis.   Cardiovascular: Negative for chest pain, palpitations, orthopnea and leg swelling.  Gastrointestinal: Negative for abdominal pain, constipation, diarrhea, nausea and vomiting.  Neurological: Negative for dizziness and headaches.  All other systems reviewed and are negative.    VITAL SIGNS: Temp:  [97.7 F (36.5 C)] 97.7 F (36.5 C) (02/21 1000) Pulse Rate:  [81-101] 82 (02/21 0900) Resp:  [22-100] 24 (02/21 0900) BP: (140-174)/(88-100) 140/88 (02/21 0900) SpO2:  [96 %-100 %] 100 % (02/21 1024) FiO2 (%):  [25 %-40 %] 25 % (02/21 1000) Weight:  [54.4 kg] 54.4 kg (02/21 0135)  Physical Examination:  GENERAL: Ill appearing, +resp distress on BiPAP HEAD: Normocephalic, atraumatic.  EYES: Pupils equal, round, reactive to light.  No scleral icterus.  MOUTH: Moist mucosal membrane. NECK: Supple. No JVD.  PULMONARY: +Bibasilar wheezing, faint lung sound CARDIOVASCULAR: S1 and S2. Regular rate and rhythm. No murmurs, rubs, or gallops.  GASTROINTESTINAL: Soft, nontender, -distended. No masses. Positive bowel sounds. No hepatosplenomegaly.  MUSCULOSKELETAL: No swelling, clubbing, or edema.  NEUROLOGIC: Alert, awake, oriented SKIN: Intact,warm,dry  I personally reviewed lab work that was obtained in last 24 hrs.  CXR Independently reviewed- Hyperinflation with emphysema, no active disease   ASSESSMENT / PLAN:  Patient is a 73 y.o. male current smoker presents to ICU with acute hypoxic respiratory failure on BiPAP secondary to acute on chronic COPD exacerbation  -Continue BiPAP prn -Continue Bronchodilator Therapy as needed -Wean Fio2 and PEEP as tolerated -Empiric Rocephin 1g IV, Day 1 -ICU cardiac monitoring -Replace electrolytes and follow labs as needed -DVT prophylaxis on Lovenox -Diet as tolerated    Jo-ku Hillery Aldo, PA-Student

## 2018-08-02 NOTE — ED Notes (Signed)
ED TO INPATIENT HANDOFF REPORT  ED Nurse Name and Phone #: Victorino DikeJennifer [phone]  S Name/Age/Gender Ian Clinesaymond T Rehberg 73 y.o. male Room/Bed: ED19A/ED19A  Code Status   Code Status: Full Code  Home/SNF/Other Home Patient oriented to: self, place, time and situation Is this baseline? Yes   Triage Complete: Triage complete  Chief Complaint diff breathing  Triage Note Pt ambulatory to STAT desk, diff breathing, grunting respirations, skin cyanotic; placed in w/c and taken immed to room 19; placed on card monitor; RT & MD paged to room; pt reports several days of SHOB unrelieved by nebs; +smoker; denies CP; oxim 77% ra   Allergies Allergies  Allergen Reactions  . Codeine Itching    itching  . Thorazine [Chlorpromazine] Hives    Hives     Level of Care/Admitting Diagnosis ED Disposition    ED Disposition Condition Comment   Admit  [facility] [[id number]]  Level of Care: Stepdown [14]  Diagnosis: Acute on chronic respiratory failure with hypoxia and hypercapnia [facility](HCC) [[id number]]  Admitting Physician: Arnaldo NatalDIAMOND, MICHAEL S [[id number]]  Attending Physician: Arnaldo NatalDIAMOND, MICHAEL S [[id number]]  Estimated length of stay: past midnight tomorrow  Certification:: I certify this patient will need inpatient services for at least 2 midnights  PT Class (Do Not Modify): Inpatient [101]  PT Acc Code (Do Not Modify): Private [1]       B Medical/Surgery History Past Medical History:  Diagnosis Date  . Arthritis   . Bronchitis   . CHF (congestive heart failure) (HCC)   . COPD (chronic obstructive pulmonary disease) (HCC)   . Coronary artery disease   . Depression   . GERD (gastroesophageal reflux disease)   . Hepatitis C   . Hypertension   . Myocardial infarction acute (HCC)   . Pleural effusion   . PTSD (post-traumatic stress disorder)   . Shortness of breath dyspnea    Past Surgical History:  Procedure Laterality Date  . APPENDECTOMY    . CORONARY  ANGIOPLASTY     stent  . INGUINAL HERNIA REPAIR Left 04/23/2015   Procedure: HERNIA REPAIR INGUINAL ADULT;  Surgeon: Nadeen LandauJarvis Wilton Smith, MD;  Location: [facility] ORS;  Service: General;  Laterality: Left;     A IV Location/Drains/Wounds Patient Lines/Drains/Airways Status   Active Line/Drains/Airways    Name:   Placement date:   Placement time:   Site:   Days:   Peripheral IV 08/02/18 Right Antecubital   08/02/18    0135    Antecubital   less than 1   Peripheral IV 08/02/18 Left Forearm   08/02/18    0149    Forearm   less than 1   Incision (Closed) 04/23/15 Groin Left   04/23/15    1613     1197          Intake/Output Last 24 hours No intake or output data in the 24 hours ending 08/02/18 [id number]  Labs/Imaging Results for orders placed or performed during the hospital encounter of 08/02/18 (from the past 48 hour(s))  CBC with Differential     Status: Abnormal   Collection Time: 08/02/18  1:33 AM  Result Value Ref Range   WBC 8.4 4.0 - 10.5 K/uL   RBC 4.25 4.22 - 5.81 MIL/uL   Hemoglobin 12.8 (L) 13.0 - 17.0 g/dL   HCT 40.139.9 02.739.0 - 25.352.0 %   MCV 93.9 80.0 - 100.0 fL   MCH 30.1 26.0 - 34.0 pg   MCHC 32.1 30.0 - 36.0 g/dL  RDW 13.6 11.5 - 15.5 %   Platelets 226 150 - 400 K/uL   nRBC 0.0 0.0 - 0.2 %   Neutrophils Relative % 84 %   Neutro Abs 7.0 1.7 - 7.7 K/uL   Lymphocytes Relative 8 %   Lymphs Abs 0.7 0.7 - 4.0 K/uL   Monocytes Relative 6 %   Monocytes Absolute 0.5 0.1 - 1.0 K/uL   Eosinophils Relative 2 %   Eosinophils Absolute 0.2 0.0 - 0.5 K/uL   Basophils Relative 0 %   Basophils Absolute 0.0 0.0 - 0.1 K/uL   Immature Granulocytes 0 %   Abs Immature Granulocytes 0.02 0.00 - 0.07 K/uL    Comment: Performed at [facility], [address] [place], [place] [address]  Comprehensive metabolic panel     Status: Abnormal   Collection Time: 08/02/18  1:33 AM  Result Value Ref Range   Sodium 136 135 - 145 mmol/L   Potassium 4.3 3.5 - 5.1 mmol/L   Chloride 102 98 -  111 mmol/L   CO2 30 22 - 32 mmol/L   Glucose, Bld 110 (H) 70 - 99 mg/dL   BUN 19 8 - 23 mg/dL   Creatinine, Ser 6.72 0.61 - 1.24 mg/dL   Calcium 8.6 (L) 8.9 - 10.3 mg/dL   Total Protein 7.6 6.5 - 8.1 g/dL   Albumin 4.1 3.5 - 5.0 g/dL   AST 17 15 - 41 U/L   ALT 13 0 - 44 U/L   Alkaline Phosphatase 74 38 - 126 U/L   Total Bilirubin 0.4 0.3 - 1.2 mg/dL   GFR calc non Af Amer >60 >60 mL/min   GFR calc Af Amer >60 >60 mL/min   Anion gap 4 (L) 5 - 15    Comment: Performed at [facility], [address] [place], [place] [address]  Troponin I - ONCE - STAT     Status: None   Collection Time: 08/02/18  1:33 AM  Result Value Ref Range   Troponin I <0.03 <0.03 ng/mL    Comment: Performed at [facility], [address] [place], [place] [address]  Brain natriuretic peptide     Status: None   Collection Time: 08/02/18  1:33 AM  Result Value Ref Range   B Natriuretic Peptide 71.0 0.0 - 100.0 pg/mL    Comment: Performed at [facility], [address] [place], [place] [address]  Blood gas, venous     Status: Abnormal   Collection Time: 08/02/18  1:37 AM  Result Value Ref Range   pH, Ven 7.30 7.250 - 7.430   pCO2, Ven 68 (H) 44.0 - 60.0 mmHg   pO2, Ven 32.0 32.0 - 45.0 mmHg   Bicarbonate 33.5 (H) 20.0 - 28.0 mmol/L   Acid-Base Excess 4.8 (H) 0.0 - 2.0 mmol/L   O2 Saturation 54.0 %   Patient temperature 37.0    Collection site VENOUS    Sample type VENOUS     Comment: Performed at [facility], [address] [place], [place] [address]   Dg Chest Portable 1 View  Result Date: 08/02/2018 CLINICAL DATA:  Shortness of breath EXAM: PORTABLE CHEST 1 VIEW COMPARISON:  06/25/2016 FINDINGS: Hyperinflation with emphysematous disease. No acute consolidation or effusion. Normal heart size. Aortic atherosclerosis. No pneumothorax. IMPRESSION: No active disease.  Hyperinflation with emphysematous disease Electronically Signed   By: Jasmine Pang M.D.   On:  08/02/2018 01:54    Pending Labs Unresulted Labs (From admission, onward)  Start     Ordered   08/09/18 0500  Creatinine, serum  (enoxaparin (LOVENOX)    CrCl >/= 30 ml/min)  Weekly,   STAT    Comments:  while on enoxaparin therapy    08/02/18 0740   08/02/18 0741  TSH  Add-on,   AD     08/02/18 0740          Vitals/Pain Today's Vitals   08/02/18 0700 08/02/18 0730 08/02/18 0800 08/02/18 0810  BP: (!) 149/100 (!) 149/98 (!) 147/91   Pulse: 89 86 81 84  Resp: (!) 26 (!) 22 (!) 24 (!) 24  SpO2: 100% 100% 100% 100%  Weight:      Height:      PainSc:        Isolation Precautions No active isolations  Medications Medications  cycloSPORINE (RESTASIS) 0.05 % ophthalmic emulsion 1 drop (has no administration in time range)  aspirin EC tablet 81 mg (has no administration in time range)  lisinopril (PRINIVIL,ZESTRIL) tablet 40 mg (has no administration in time range)  simvastatin (ZOCOR) tablet 20 mg (has no administration in time range)  mirtazapine (REMERON) tablet 15 mg (has no administration in time range)  pantoprazole (PROTONIX) EC tablet 40 mg (has no administration in time range)  hydrALAZINE (APRESOLINE) tablet 50 mg (has no administration in time range)  metoprolol succinate (TOPROL-XL) 24 hr tablet 25 mg (has no administration in time range)  cyclobenzaprine (FLEXERIL) tablet 5 mg (has no administration in time range)  enoxaparin (LOVENOX) injection 40 mg (has no administration in time range)  0.9 %  sodium chloride infusion (has no administration in time range)  acetaminophen (TYLENOL) tablet 650 mg (has no administration in time range)    Or  acetaminophen (TYLENOL) suppository 650 mg (has no administration in time range)  docusate sodium (COLACE) capsule 100 mg (has no administration in time range)  ondansetron (ZOFRAN) tablet 4 mg (has no administration in time range)    Or  ondansetron (ZOFRAN) injection 4 mg (has no administration in time range)  albuterol  (PROVENTIL) (2.5 MG/3ML) 0.083% nebulizer solution 2.5 mg (has no administration in time range)  methylPREDNISolone sodium succinate (SOLU-MEDROL) 125 mg/2 mL injection 60 mg (has no administration in time range)  budesonide (PULMICORT) nebulizer solution 0.5 mg (has no administration in time range)  ipratropium-albuterol (DUONEB) 0.5-2.5 (3) MG/3ML nebulizer solution 3 mL (has no administration in time range)  guaiFENesin (MUCINEX) 12 hr tablet 600 mg (has no administration in time range)  methylPREDNISolone sodium succinate (SOLU-MEDROL) 125 mg/2 mL injection 125 mg (125 mg Intravenous Given 08/02/18 0135)  magnesium sulfate IVPB 2 g 50 mL (0 g Intravenous Stopped 08/02/18 0304)  ipratropium-albuterol (DUONEB) 0.5-2.5 (3) MG/3ML nebulizer solution 3 mL (3 mLs Nebulization Given 08/02/18 0135)  ipratropium-albuterol (DUONEB) 0.5-2.5 (3) MG/3ML nebulizer solution 3 mL (3 mLs Nebulization Given 08/02/18 0135)  ipratropium-albuterol (DUONEB) 0.5-2.5 (3) MG/3ML nebulizer solution 3 mL (3 mLs Nebulization Given 08/02/18 0135)    Mobility walks with person assist Low fall risk   Focused Assessments Pulmonary Assessment Handoff:  Lung sounds: Bilateral Breath Sounds: Diminished, Clear L Breath Sounds: Diminished R Breath Sounds: Diminished O2 Device: Room Air        R Recommendations: See Admitting Provider Note  Report given to:   Additional Notes:

## 2018-08-02 NOTE — ED Triage Notes (Addendum)
Pt ambulatory to STAT desk, diff breathing, grunting respirations, skin cyanotic; placed in w/c and taken immed to room 19; placed on card monitor; RT & MD paged to room; pt reports several days of SHOB unrelieved by nebs; +smoker; denies CP; oxim 77% ra

## 2018-08-02 NOTE — ED Notes (Signed)
Report received, care of pt assumed at this time.  Pt remains on BiPap, NAD noted, awaiting inpatient placement, will continue to monitor.

## 2018-08-02 NOTE — H&P (Signed)
Ian Herman is an 73 y.o. male.   Chief Complaint: Shortness of breath HPI: The patient with past medical history of COPD, hypertension and CAD presents to the emergency department complaining of shortness of breath.  The patient was found to have oxygen saturation of 77% on room air.  He was placed on BiPAP and given Solu-Medrol as well as duo nebs which improved his respiratory rate.  Chest x-ray showed no active disease due to the patient's increased work of breathing and oxygen requirement the emergency department staff, hospitalist service for admission.  Past Medical History:  Diagnosis Date  . Arthritis   . Bronchitis   . CHF (congestive heart failure) (HCC)   . COPD (chronic obstructive pulmonary disease) (HCC)   . Coronary artery disease   . Depression   . GERD (gastroesophageal reflux disease)   . Hepatitis C   . Hypertension   . Myocardial infarction acute (HCC)   . Pleural effusion   . PTSD (post-traumatic stress disorder)   . Shortness of breath dyspnea     Past Surgical History:  Procedure Laterality Date  . APPENDECTOMY    . CORONARY ANGIOPLASTY     stent  . INGUINAL HERNIA REPAIR Left 04/23/2015   Procedure: HERNIA REPAIR INGUINAL ADULT;  Surgeon: Nadeen Landau, MD;  Location: [facility] ORS;  Service: General;  Laterality: Left;    No family history on file. Patient unable to vocalize much due to respiratory distress.  Social History:  reports that he has been smoking. He has been smoking about 0.50 packs per day. He has never used smokeless tobacco. He reports current alcohol use. He reports that he does not use drugs.  Allergies:  Allergies  Allergen Reactions  . Codeine Itching    itching  . Thorazine [Chlorpromazine] Hives    Hives     Prior to Admission medications   Medication Sig Start Date End Date Taking? Authorizing Provider  albuterol (PROVENTIL HFA;VENTOLIN HFA) 108 (90 BASE) MCG/ACT inhaler Inhale 2 puffs into the lungs every 6 (six)  hours as needed. wheezing   Yes [provider]  aspirin EC 81 MG tablet Take 81 mg by mouth daily.   Yes [provider]  cyclobenzaprine (FLEXERIL) 5 MG tablet Take 1 tablet (5 mg total) by mouth 3 (three) times daily as needed for muscle spasms. 01/02/18  Yes Menshew, Charlesetta Ivory, PA-C  cycloSPORINE (RESTASIS) 0.05 % ophthalmic emulsion Place 1 drop into both eyes 2 (two) times daily.   Yes [provider]  etodolac (LODINE) 500 MG tablet Take by mouth.   Yes [provider]  guaiFENesin (MUCINEX) 600 MG 12 hr tablet Take by mouth 2 (two) times daily as needed.   Yes [provider]  hydrALAZINE (APRESOLINE) 50 MG tablet Take 50 mg by mouth 2 (two) times daily.   Yes [provider]  lisinopril (PRINIVIL,ZESTRIL) 40 MG tablet Take 40 mg by mouth daily.   Yes [provider]  metoprolol succinate (TOPROL-XL) 25 MG 24 hr tablet Take 25 mg by mouth 2 (two) times daily.   Yes [provider]  mirtazapine (REMERON) 15 MG tablet Take 15 mg by mouth at bedtime.   Yes [provider]  omeprazole (PRILOSEC) 20 MG capsule Take 20 mg by mouth daily.   Yes [provider]  simvastatin (ZOCOR) 20 MG tablet Take 20 mg by mouth every evening.   Yes [provider]     Results for orders placed or  performed during the hospital encounter of 08/02/18 (from the past 48 hour(s))  CBC with Differential     Status: Abnormal   Collection Time: 08/02/18  1:33 AM  Result Value Ref Range   WBC 8.4 4.0 - 10.5 K/uL   RBC 4.25 4.22 - 5.81 MIL/uL   Hemoglobin 12.8 (L) 13.0 - 17.0 g/dL   HCT 00.9 38.1 - 82.9 %   MCV 93.9 80.0 - 100.0 fL   MCH 30.1 26.0 - 34.0 pg   MCHC 32.1 30.0 - 36.0 g/dL   RDW 93.7 16.9 - 67.8 %   Platelets 226 150 - 400 K/uL   nRBC 0.0 0.0 - 0.2 %   Neutrophils Relative % 84 %   Neutro Abs 7.0 1.7 - 7.7 K/uL   Lymphocytes Relative 8 %   Lymphs Abs 0.7 0.7 - 4.0 K/uL   Monocytes Relative 6 %    Monocytes Absolute 0.5 0.1 - 1.0 K/uL   Eosinophils Relative 2 %   Eosinophils Absolute 0.2 0.0 - 0.5 K/uL   Basophils Relative 0 %   Basophils Absolute 0.0 0.0 - 0.1 K/uL   Immature Granulocytes 0 %   Abs Immature Granulocytes 0.02 0.00 - 0.07 K/uL    Comment: Performed at [facility], [address] [place], [place] [address]  Comprehensive metabolic panel     Status: Abnormal   Collection Time: 08/02/18  1:33 AM  Result Value Ref Range   Sodium 136 135 - 145 mmol/L   Potassium 4.3 3.5 - 5.1 mmol/L   Chloride 102 98 - 111 mmol/L   CO2 30 22 - 32 mmol/L   Glucose, Bld 110 (H) 70 - 99 mg/dL   BUN 19 8 - 23 mg/dL   Creatinine, Ser 1.75 0.61 - 1.24 mg/dL   Calcium 8.6 (L) 8.9 - 10.3 mg/dL   Total Protein 7.6 6.5 - 8.1 g/dL   Albumin 4.1 3.5 - 5.0 g/dL   AST 17 15 - 41 U/L   ALT 13 0 - 44 U/L   Alkaline Phosphatase 74 38 - 126 U/L   Total Bilirubin 0.4 0.3 - 1.2 mg/dL   GFR calc non Af Amer >60 >60 mL/min   GFR calc Af Amer >60 >60 mL/min   Anion gap 4 (L) 5 - 15    Comment: Performed at [facility], [address] [place], [place] [address]  Troponin I - ONCE - STAT     Status: None   Collection Time: 08/02/18  1:33 AM  Result Value Ref Range   Troponin I <0.03 <0.03 ng/mL    Comment: Performed at [facility], [address] [place], [place] [address]  Brain natriuretic peptide     Status: None   Collection Time: 08/02/18  1:33 AM  Result Value Ref Range   B Natriuretic Peptide 71.0 0.0 - 100.0 pg/mL    Comment: Performed at [facility], [address] [place], [place] [address]  Blood gas, venous     Status: Abnormal   Collection Time: 08/02/18  1:37 AM  Result Value Ref Range   pH, Ven 7.30 7.250 - 7.430   pCO2, Ven 68 (H) 44.0 - 60.0 mmHg   pO2, Ven 32.0 32.0 - 45.0 mmHg   Bicarbonate 33.5 (H) 20.0 - 28.0 mmol/L   Acid-Base Excess 4.8 (H) 0.0 - 2.0 mmol/L   O2 Saturation 54.0 %   Patient temperature 37.0     Collection site VENOUS    Sample  type VENOUS     Comment: Performed at [facility], [address] [place], [place] [address]   Dg Chest Portable 1 View  Result Date: 08/02/2018 CLINICAL DATA:  Shortness of breath EXAM: PORTABLE CHEST 1 VIEW COMPARISON:  06/25/2016 FINDINGS: Hyperinflation with emphysematous disease. No acute consolidation or effusion. Normal heart size. Aortic atherosclerosis. No pneumothorax. IMPRESSION: No active disease.  Hyperinflation with emphysematous disease Electronically Signed   By: Jasmine PangKim  Fujinaga M.D.   On: 08/02/2018 01:54    Review of Systems  Constitutional: Negative for chills and fever.  HENT: Negative for sore throat and tinnitus.   Eyes: Negative for blurred vision and redness.  Respiratory: Positive for cough and sputum production. Negative for shortness of breath.   Cardiovascular: Negative for chest pain, palpitations, orthopnea and PND.  Gastrointestinal: Negative for abdominal pain, diarrhea, nausea and vomiting.  Genitourinary: Negative for dysuria, frequency and urgency.  Musculoskeletal: Negative for joint pain and myalgias.  Skin: Negative for rash.       No lesions  Neurological: Negative for speech change, focal weakness and weakness.  Endo/Heme/Allergies: Does not bruise/bleed easily.       No temperature intolerance  Psychiatric/Behavioral: Negative for depression and suicidal ideas.    Blood pressure (!) 152/95, pulse 94, resp. rate (!) 24, height 5\' 8"  (1.727 m), weight 54.4 kg, SpO2 100 %. Physical Exam  Vitals reviewed. Constitutional: He is oriented to person, place, and time. He appears well-developed and well-nourished. No distress.  HENT:  Head: Normocephalic and atraumatic.  Mouth/Throat: Oropharynx is clear and moist.  Eyes: Pupils are equal, round, and reactive to light. Conjunctivae and EOM are normal. No scleral icterus.  Neck: Normal range of motion. Neck supple. No JVD present. No tracheal deviation present.  No thyromegaly present.  Cardiovascular: Normal rate, regular rhythm and normal heart sounds. Exam reveals no gallop and no friction rub.  No murmur heard. Respiratory: He is in respiratory distress. He has wheezes.  GI: Soft. Bowel sounds are normal. He exhibits no distension. There is no abdominal tenderness.  Genitourinary:    Genitourinary Comments: Deferred   Musculoskeletal: Normal range of motion.        General: No edema.  Lymphadenopathy:    He has no cervical adenopathy.  Neurological: He is alert and oriented to person, place, and time. No cranial nerve deficit.  Skin: Skin is warm and dry. No rash noted. No erythema.  Psychiatric: He has a normal mood and affect. His behavior is normal. Judgment and thought content normal.     Assessment/Plan This is a 73 year old male admitted for respiratory failure. 1.  Respiratory failure: Acute on chronic; with hypoxia and hypercapnia.  Continue BiPAP.  Maintain oxygen saturations 88 to 92%.  Steroid taper for COPD exacerbation. 2.  Hypertension: Uncontrolled; continue lisinopril, hydralazine and metoprolol. 3.  CAD: Stable; continue aspirin.  Monitor telemetry 4.  Hyperlipidemia: Continue statin therapy 5.  Hepatitis C: Stable 6.  DVT prophylaxis: Lovenox 7.  GI prophylaxis: Pantoprazole per home regimen The patient is a full code.  I have personally spent 45 minutes in critical care time with this patient.   Arnaldo Nataliamond,  Winry Egnew S, MD 08/02/2018, 4:35 AM

## 2018-08-03 NOTE — Progress Notes (Signed)
Advanced care plan.  Purpose of the Encounter: CODE STATUS  Parties in Attendance: Patient himself  Patient's Decision Capacity: Intact  Subjective/Patient's story: Patient is a 73 year old with history of COPD, hypertension coronary artery disease presented with acute respiratory failure   Objective/Medical story I discussed with the patient regarding his desires for cardiac and pulmonary resuscitation   Goals of care determination:  Patient states that he would like everything to be done and wants to be a full code   CODE STATUS:  Full code  Time spent discussing advanced care planning: 16 minutes

## 2018-08-03 NOTE — Progress Notes (Signed)
[facility] [place] [facility]                                                                                                                                                                                  Patient Demographics   Ian Herman, is a 73 y.o. male, DOB - 1945-09-12, IOM:[id number]  Admit date - 08/02/2018   Admitting Physician Arnaldo Natal, MD  Outpatient Primary MD for the patient is Louanne Skye, MD   LOS - 1  Subjective: Patient was feeling better this morning at the time when I saw him he was off BiPAP since then he is requiring intermittent BiPAP    Review of Systems:   CONSTITUTIONAL: No documented fever. No fatigue, weakness. No weight gain, no weight loss.  EYES: No blurry or double vision.  ENT: No tinnitus. No postnasal drip. No redness of the oropharynx.  RESPIRATORY: No cough, no wheeze, no hemoptysis.  Positive dyspnea.  CARDIOVASCULAR: No chest pain. No orthopnea. No palpitations. No syncope.  GASTROINTESTINAL: No nausea, no vomiting or diarrhea. No abdominal pain. No melena or hematochezia.  GENITOURINARY: No dysuria or hematuria.  ENDOCRINE: No polyuria or nocturia. No heat or cold intolerance.  HEMATOLOGY: No anemia. No bruising. No bleeding.  INTEGUMENTARY: No rashes. No lesions.  MUSCULOSKELETAL: No arthritis. No swelling. No gout.  NEUROLOGIC: No numbness, tingling, or ataxia. No seizure-type activity.  PSYCHIATRIC: No anxiety. No insomnia. No ADD.    Vitals:   Vitals:   08/03/18 1000 08/03/18 1100 08/03/18 1101 08/03/18 1237  BP: 127/76 131/74 131/74 135/81  Pulse: 83 87 87 88  Resp: (!) 23 (!) 29 (!) 29   Temp:      TempSrc:      SpO2: 97% 94% 94%   Weight:      Height:        Wt Readings from Last 3 Encounters:  08/03/18 48.1 kg  01/24/18 59 kg  01/02/18 57.2 kg     Intake/Output Summary (Last 24 hours) at 08/03/2018 1358 Last data filed at 08/03/2018 1003 Gross per 24 hour  Intake 750 ml   Output 1425 ml  Net -675 ml    Physical Exam:   GENERAL: Pleasant-appearing in no apparent distress.  HEAD, EYES, EARS, NOSE AND THROAT: Atraumatic, normocephalic. Extraocular muscles are intact. Pupils equal and reactive to light. Sclerae anicteric. No conjunctival injection. No oro-pharyngeal erythema.  NECK: Supple. There is no jugular venous distention. No bruits, no lymphadenopathy, no thyromegaly.  HEART: Regular rate and rhythm,. No murmurs, no rubs, no clicks.  LUNGS: Decreased breath sounds bilaterally ABDOMEN: Soft, flat, nontender, nondistended. Has good bowel sounds. No hepatosplenomegaly appreciated.  EXTREMITIES:  No evidence of any cyanosis, clubbing, or peripheral edema.  +2 pedal and radial pulses bilaterally.  NEUROLOGIC: The patient is alert, awake, and oriented x3 with no focal motor or sensory deficits appreciated bilaterally.  SKIN: Moist and warm with no rashes appreciated.  Psych: Not anxious, depressed LN: No inguinal LN enlargement    Antibiotics   Anti-infectives (From admission, onward)   Start     Dose/Rate Route Frequency Ordered Stop   08/02/18 1030  cefTRIAXone (ROCEPHIN) 1 g in sodium chloride 0.9 % 100 mL IVPB     1 g 200 mL/hr over 30 Minutes Intravenous Daily 08/02/18 1011        Medications   Scheduled Meds: . aspirin EC  81 mg Oral Daily  . budesonide (PULMICORT) nebulizer solution  0.5 mg Nebulization BID  . chlorhexidine  15 mL Mouth Rinse BID  . cycloSPORINE  1 drop Both Eyes BID  . docusate sodium  100 mg Oral BID  . enoxaparin (LOVENOX) injection  40 mg Subcutaneous Q24H  . feeding supplement (ENSURE ENLIVE)  237 mL Oral TID BM  . hydrALAZINE  50 mg Oral BID  . ipratropium-albuterol  3 mL Nebulization Q6H  . lisinopril  40 mg Oral Daily  . mouth rinse  15 mL Mouth Rinse q12n4p  . methylPREDNISolone (SOLU-MEDROL) injection  40 mg Intravenous Q12H  . metoprolol succinate  25 mg Oral BID  . mirtazapine  15 mg Oral QHS  .  multivitamin with minerals  1 tablet Oral Daily  . pantoprazole  40 mg Oral Daily  . simvastatin  20 mg Oral QPM   Continuous Infusions: . cefTRIAXone (ROCEPHIN)  IV 1 g (08/03/18 1011)   PRN Meds:.acetaminophen **OR** acetaminophen, albuterol, cyclobenzaprine, guaiFENesin, ondansetron **OR** ondansetron (ZOFRAN) IV   Data Review:   Micro Results Recent Results (from the past 240 hour(s))  MRSA PCR Screening     Status: None   Collection Time: 08/02/18  9:46 AM  Result Value Ref Range Status   MRSA by PCR NEGATIVE NEGATIVE Final    Comment:        The GeneXpert MRSA Assay (FDA approved for NASAL specimens only), is one component of a comprehensive MRSA colonization surveillance program. It is not intended to diagnose MRSA infection nor to guide or monitor treatment for MRSA infections. Performed at [facility], [address], [place], [place] [address]     Radiology Reports Dg Chest Portable 1 View  Result Date: 08/02/2018 CLINICAL DATA:  Shortness of breath EXAM: PORTABLE CHEST 1 VIEW COMPARISON:  06/25/2016 FINDINGS: Hyperinflation with emphysematous disease. No acute consolidation or effusion. Normal heart size. Aortic atherosclerosis. No pneumothorax. IMPRESSION: No active disease.  Hyperinflation with emphysematous disease Electronically Signed   By: Jasmine Pang M.D.   On: 08/02/2018 01:54     CBC Recent Labs  Lab 08/02/18 0133  WBC 8.4  HGB 12.8*  HCT 39.9  PLT 226  MCV 93.9  MCH 30.1  MCHC 32.1  RDW 13.6  LYMPHSABS 0.7  MONOABS 0.5  EOSABS 0.2  BASOSABS 0.0    Chemistries  Recent Labs  Lab 08/02/18 0133  NA 136  K 4.3  CL 102  CO2 30  GLUCOSE 110*  BUN 19  CREATININE 0.74  CALCIUM 8.6*  AST 17  ALT 13  ALKPHOS 74  BILITOT 0.4   ------------------------------------------------------------------------------------------------------------------ estimated creatinine clearance is 57.6 mL/min (by C-G formula based on SCr of 0.74  mg/dL). ------------------------------------------------------------------------------------------------------------------ No results for input(s): HGBA1C in the  last 72 hours. ------------------------------------------------------------------------------------------------------------------ No results for input(s): CHOL, HDL, LDLCALC, TRIG, CHOLHDL, LDLDIRECT in the last 72 hours. ------------------------------------------------------------------------------------------------------------------ Recent Labs    08/02/18 0133  TSH 1.216   ------------------------------------------------------------------------------------------------------------------ No results for input(s): VITAMINB12, FOLATE, FERRITIN, TIBC, IRON, RETICCTPCT in the last 72 hours.  Coagulation profile No results for input(s): INR, PROTIME in the last 168 hours.  No results for input(s): DDIMER in the last 72 hours.  Cardiac Enzymes Recent Labs  Lab 08/02/18 0133  TROPONINI <0.03   ------------------------------------------------------------------------------------------------------------------ Invalid input(s): POCBNP    Assessment & Plan   This is a [age] year old male admitted for respiratory failure. 1.    Acute on chronic respiratory failure: Acute on chronic; with hypoxia and hypercapnia.  Continue BiPAP intermittently, continue therapy for COPD exacerbation 2.  Hypertension:continue lisinopril, hydralazine and metoprolol. 3.  CAD: Stable; continue aspirin.  Monitor telemetry 4.  Hyperlipidemia: Continue statin therapy 5.  Hepatitis C: Stable 6.  DVT prophylaxis: Lovenox 7.  GI prophylaxis: Pantoprazole per home regimen      Code Status Orders  (From admission, onward)         Start     Ordered   08/02/18 0741  Full code  Continuous     08/02/18 0740        Code Status History    This patient has a current code status but no historical code status.           Consults pulmonary critical  care  DVT Prophylaxis  Lovenox  Lab Results  Component Value Date   PLT 226 08/02/2018     Time Spent in minutes 35 minutes greater than 50% of time spent in care coordination and counseling patient regarding the condition and plan of care.   Auburn BilberryShreyang Dorcas Melito M.D on 08/03/2018 at 1:58 PM  Between 7am to 6pm - Pager - [phone]  After 6pm go to [email or web address] - Social research officer, governmentpassword [email or web address]  [employer] [facility]   Office  [phone]

## 2018-08-03 NOTE — Progress Notes (Signed)
When seen initially seen this morning, he was comfortable on nasal cannula oxygen.  However, he continues to cycle on and off BiPAP which he believes is very beneficial when he feels short of breath.  He has no new complaints.  Vitals:   08/03/18 1000 08/03/18 1100 08/03/18 1101 08/03/18 1237  BP: 127/76 131/74 131/74 135/81  Pulse: 83 87 87 88  Resp: (!) 23 (!) 29 (!) 29   Temp:      TempSrc:      SpO2: 97% 94% 94%   Weight:      Height:       No distress on [place] O2 Underweight/cachectic Disheveled appearing Cognition intact HEENT WNL Neck supple, JVP cannot be visualized Diffuse scattered wheezes Regular, no M NABS, soft Extremities warm, no edema  BMP Latest Ref Rng & Units 08/02/2018 06/25/2016 09/10/2013  Glucose 70 - 99 mg/dL 320(E) 334(D) 568(S)  BUN 8 - 23 mg/dL 19 20 18   Creatinine 0.61 - 1.24 mg/dL 1.68 3.72 9.02  Sodium 135 - 145 mmol/L 136 139 135(L)  Potassium 3.5 - 5.1 mmol/L 4.3 4.5 3.6(L)  Chloride 98 - 111 mmol/L 102 105 97  CO2 22 - 32 mmol/L 30 28 19   Calcium 8.9 - 10.3 mg/dL 1.1(D) 9.3 8.7   CBC Latest Ref Rng & Units 08/02/2018 06/25/2016 09/10/2013  WBC 4.0 - 10.5 K/uL 8.4 8.9 7.5  Hemoglobin 13.0 - 17.0 g/dL 12.8(L) 14.0 14.5  Hematocrit 39.0 - 52.0 % 39.9 40.3 40.1  Platelets 150 - 400 K/uL 226 187 205   CXR: No new film  IMPRESSION: Acute/chronic hypoxemic and hypercarbic respiratory failure COPD exacerbation Smoker Protein-calorie malnutrition  PLAN/REC: Continue PRN BiPAP Continue supplemental O2 Continue systemic steroids at current dose Continue nebulized steroids and bronchodilators Continue empiric antibiotics - ceftriaxone I again emphasized the need for smoking cessation Continue to push nutrition as much as tolerated Watch in SDU until fully liberated from noninvasive ventilation  Billy Fischer, MD PCCM service Mobile [phone] Pager [phone] 08/03/2018 1:34 PM

## 2018-08-04 DIAGNOSIS — F17200 Nicotine dependence, unspecified, uncomplicated: Secondary | ICD-10-CM

## 2018-08-04 LAB — CBC
HCT: 40.4 % (ref 39.0–52.0)
Hemoglobin: 13 g/dL (ref 13.0–17.0)
MCH: 30.3 pg (ref 26.0–34.0)
MCHC: 32.2 g/dL (ref 30.0–36.0)
MCV: 94.2 fL (ref 80.0–100.0)
Platelets: 267 10*3/uL (ref 150–400)
RBC: 4.29 MIL/uL (ref 4.22–5.81)
RDW: 13.9 % (ref 11.5–15.5)
WBC: 9.8 10*3/uL (ref 4.0–10.5)
nRBC: 0 % (ref 0.0–0.2)

## 2018-08-04 LAB — BASIC METABOLIC PANEL
Anion gap: 8 (ref 5–15)
BUN: 34 mg/dL — ABNORMAL HIGH (ref 8–23)
CO2: 26 mmol/L (ref 22–32)
Calcium: 8.8 mg/dL — ABNORMAL LOW (ref 8.9–10.3)
Chloride: 101 mmol/L (ref 98–111)
Creatinine, Ser: 0.7 mg/dL (ref 0.61–1.24)
GFR calc Af Amer: >60 mL/min (ref >60)
GFR calc non Af Amer: >60 mL/min (ref >60)
Glucose, Bld: 147 mg/dL — ABNORMAL HIGH (ref 70–99)
Potassium: 4.5 mmol/L (ref 3.5–5.1)
Sodium: 135 mmol/L (ref 135–145)

## 2018-08-04 LAB — MAGNESIUM: Magnesium: 2.1 mg/dL (ref 1.7–2.4)

## 2018-08-04 MED ORDER — PREDNISONE 10 MG PO TABS
40.0000 mg | ORAL_TABLET | Freq: Every day | ORAL | Status: DC
  Administered 2018-08-05: 40 mg via ORAL
  Filled 2018-08-04: qty 4

## 2018-08-04 MED ORDER — DOXYCYCLINE HYCLATE 100 MG PO TABS
100.0000 mg | ORAL_TABLET | Freq: Two times a day (BID) | ORAL | Status: DC
  Administered 2018-08-04 – 2018-08-07 (×7): 100 mg via ORAL
  Filled 2018-08-04 (×7): qty 1

## 2018-08-04 NOTE — Progress Notes (Signed)
[employer] - [place] at [facility]                                                                                                                                                                                  Patient Demographics   Ian Herman, is a 73 y.o. male, DOB - 04/18/1946, VHQ:[id number]  Admit date - 08/02/2018   Admitting Physician Arnaldo Natal, MD  Outpatient Primary MD for the patient is Louanne Skye, MD   LOS - 2  Subjective: Continues to be on BiPAP this morning.  States shortness of breaths with some improvement  Review of Systems:   CONSTITUTIONAL: No documented fever. No fatigue, weakness. No weight gain, no weight loss.  EYES: No blurry or double vision.  ENT: No tinnitus. No postnasal drip. No redness of the oropharynx.  RESPIRATORY: No cough, no wheeze, no hemoptysis.  Positive dyspnea.  CARDIOVASCULAR: No chest pain. No orthopnea. No palpitations. No syncope.  GASTROINTESTINAL: No nausea, no vomiting or diarrhea. No abdominal pain. No melena or hematochezia.  GENITOURINARY: No dysuria or hematuria.  ENDOCRINE: No polyuria or nocturia. No heat or cold intolerance.  HEMATOLOGY: No anemia. No bruising. No bleeding.  INTEGUMENTARY: No rashes. No lesions.  MUSCULOSKELETAL: No arthritis. No swelling. No gout.  NEUROLOGIC: No numbness, tingling, or ataxia. No seizure-type activity.  PSYCHIATRIC: No anxiety. No insomnia. No ADD.    Vitals:   Vitals:   08/04/18 0800 08/04/18 0900 08/04/18 1000 08/04/18 1100  BP: 126/86 128/65 121/76 128/79  Pulse: 73 73 73 72  Resp: 20 (!) 23 (!) 28 (!) 21  Temp: 97.9 F (36.6 C)     TempSrc: Oral     SpO2: 95% 99% 95% 98%  Weight:      Height:        Wt Readings from Last 3 Encounters:  08/04/18 48.9 kg  01/24/18 59 kg  01/02/18 57.2 kg     Intake/Output Summary (Last 24 hours) at 08/04/2018 1357 Last data filed at 08/04/2018 1006 Gross per 24 hour  Intake 600 ml  Output 1875 ml  Net  -1275 ml    Physical Exam:   GENERAL: Pleasant-appearing in no apparent distress.  HEAD, EYES, EARS, NOSE AND THROAT: Atraumatic, normocephalic. Extraocular muscles are intact. Pupils equal and reactive to light. Sclerae anicteric. No conjunctival injection. No oro-pharyngeal erythema.  NECK: Supple. There is no jugular venous distention. No bruits, no lymphadenopathy, no thyromegaly.  HEART: Regular rate and rhythm,. No murmurs, no rubs, no clicks.  LUNGS: Decreased breath sounds bilaterally ABDOMEN: Soft, flat, nontender, nondistended. Has good bowel sounds. No hepatosplenomegaly appreciated.  EXTREMITIES: No evidence of any cyanosis, clubbing, or peripheral  edema.  +2 pedal and radial pulses bilaterally.  NEUROLOGIC: The patient is alert, awake, and oriented x3 with no focal motor or sensory deficits appreciated bilaterally.  SKIN: Moist and warm with no rashes appreciated.  Psych: Not anxious, depressed LN: No inguinal LN enlargement    Antibiotics   Anti-infectives (From admission, onward)   Start     Dose/Rate Route Frequency Ordered Stop   08/04/18 1000  doxycycline (VIBRA-TABS) tablet 100 mg     100 mg Oral Every 12 hours 08/04/18 0946 08/09/18 0959   08/02/18 1030  cefTRIAXone (ROCEPHIN) 1 g in sodium chloride 0.9 % 100 mL IVPB     1 g 200 mL/hr over 30 Minutes Intravenous Daily 08/02/18 1011        Medications   Scheduled Meds: . aspirin EC  81 mg Oral Daily  . budesonide (PULMICORT) nebulizer solution  0.5 mg Nebulization BID  . chlorhexidine  15 mL Mouth Rinse BID  . cycloSPORINE  1 drop Both Eyes BID  . docusate sodium  100 mg Oral BID  . doxycycline  100 mg Oral Q12H  . enoxaparin (LOVENOX) injection  40 mg Subcutaneous Q24H  . feeding supplement (ENSURE ENLIVE)  237 mL Oral TID BM  . hydrALAZINE  50 mg Oral BID  . ipratropium-albuterol  3 mL Nebulization Q6H  . lisinopril  40 mg Oral Daily  . mouth rinse  15 mL Mouth Rinse q12n4p  . methylPREDNISolone  (SOLU-MEDROL) injection  40 mg Intravenous Q12H  . metoprolol succinate  25 mg Oral BID  . mirtazapine  15 mg Oral QHS  . multivitamin with minerals  1 tablet Oral Daily  . pantoprazole  40 mg Oral Daily  . [START ON 08/05/2018] predniSONE  40 mg Oral Q breakfast  . simvastatin  20 mg Oral QPM   Continuous Infusions: . cefTRIAXone (ROCEPHIN)  IV 1 g (08/04/18 1004)   PRN Meds:.acetaminophen **OR** [DISCONTINUED] acetaminophen, albuterol, cyclobenzaprine, guaiFENesin, ondansetron **OR** ondansetron (ZOFRAN) IV   Data Review:   Micro Results Recent Results (from the past 240 hour(s))  MRSA PCR Screening     Status: None   Collection Time: 08/02/18  9:46 AM  Result Value Ref Range Status   MRSA by PCR NEGATIVE NEGATIVE Final    Comment:        The GeneXpert MRSA Assay (FDA approved for NASAL specimens only), is one component of a comprehensive MRSA colonization surveillance program. It is not intended to diagnose MRSA infection nor to guide or monitor treatment for MRSA infections. Performed at [facility], [address] [place], [place] [address]     Radiology Reports Dg Chest Portable 1 View  Result Date: 08/02/2018 CLINICAL DATA:  Shortness of breath EXAM: PORTABLE CHEST 1 VIEW COMPARISON:  06/25/2016 FINDINGS: Hyperinflation with emphysematous disease. No acute consolidation or effusion. Normal heart size. Aortic atherosclerosis. No pneumothorax. IMPRESSION: No active disease.  Hyperinflation with emphysematous disease Electronically Signed   By: Jasmine Pang M.D.   On: 08/02/2018 01:54     CBC Recent Labs  Lab 08/02/18 0133 08/04/18 0542  WBC 8.4 9.8  HGB 12.8* 13.0  HCT 39.9 40.4  PLT 226 267  MCV 93.9 94.2  MCH 30.1 30.3  MCHC 32.1 32.2  RDW 13.6 13.9  LYMPHSABS 0.7  --   MONOABS 0.5  --   EOSABS 0.2  --   BASOSABS 0.0  --     Chemistries  Recent Labs  Lab 08/02/18 0133 08/04/18 0542  NA  136 135  K 4.3 4.5  CL 102 101  CO2 30 26   GLUCOSE 110* 147*  BUN 19 34*  CREATININE 0.74 0.70  CALCIUM 8.6* 8.8*  MG  --  2.1  AST 17  --   ALT 13  --   ALKPHOS 74  --   BILITOT 0.4  --    ------------------------------------------------------------------------------------------------------------------ estimated creatinine clearance is 58.6 mL/min (by C-G formula based on SCr of 0.7 mg/dL). ------------------------------------------------------------------------------------------------------------------ No results for input(s): HGBA1C in the last 72 hours. ------------------------------------------------------------------------------------------------------------------ No results for input(s): CHOL, HDL, LDLCALC, TRIG, CHOLHDL, LDLDIRECT in the last 72 hours. ------------------------------------------------------------------------------------------------------------------ Recent Labs    08/02/18 0133  TSH 1.216   ------------------------------------------------------------------------------------------------------------------ No results for input(s): VITAMINB12, FOLATE, FERRITIN, TIBC, IRON, RETICCTPCT in the last 72 hours.  Coagulation profile No results for input(s): INR, PROTIME in the last 168 hours.  No results for input(s): DDIMER in the last 72 hours.  Cardiac Enzymes Recent Labs  Lab 08/02/18 0133  TROPONINI <0.03   ------------------------------------------------------------------------------------------------------------------ Invalid input(s): POCBNP    Assessment & Plan   This is a 73 year old male admitted for respiratory failure. 1.    Acute on chronic respiratory failure: Acute on chronic; with hypoxia and hypercapnia.  Continue BiPAP intermittently, continue therapy for COPD exacerbation slow to improve 2.  Hypertension:continue lisinopril, hydralazine and metoprolol. 3.  CAD: Stable; continue aspirin.  Monitor telemetry 4.  Hyperlipidemia: Continue statin therapy 5.  Hepatitis C: Stable 6.   DVT prophylaxis: Lovenox 7.  GI prophylaxis: Pantoprazole per home regimen      Code Status Orders  (From admission, onward)         Start     Ordered   08/02/18 0741  Full code  Continuous     08/02/18 0740        Code Status History    This patient has a current code status but no historical code status.           Consults pulmonary critical care  DVT Prophylaxis  Lovenox  Lab Results  Component Value Date   PLT 267 08/04/2018     Time Spent in minutes 35 minutes greater than 50% of time spent in care coordination and counseling patient regarding the condition and plan of care.   Auburn Bilberry M.D on 08/04/2018 at 1:57 PM  Between 7am to 6pm - Pager - [phone]  After 6pm go to [email or web address] - Social research officer, government  [employer]   Office  [phone]

## 2018-08-04 NOTE — Progress Notes (Signed)
Comfortable on [place] O2.  No acute distress.  No new complaints.  Vitals:   08/04/18 0800 08/04/18 0900 08/04/18 1000 08/04/18 1100  BP: 126/86 128/65 121/76 128/79  Pulse: 73 73 73 72  Resp: 20 (!) 23 (!) 28 (!) 21  Temp: 97.9 F (36.6 C)     TempSrc: Oral     SpO2: 95% 99% 95% 98%  Weight:      Height:       NAD Cachectic HEENT without acute findings JVP cannot be visualized Few scattered wheezes RRR, no M NABS, NT Extremities warm, no edema No focal neurologic deficits  BMP Latest Ref Rng & Units 08/04/2018 08/02/2018 06/25/2016  Glucose 70 - 99 mg/dL 621(H) 086(V) 784(O)  BUN 8 - 23 mg/dL 96(E) 19 20  Creatinine 0.61 - 1.24 mg/dL 9.52 8.41 3.24  Sodium 135 - 145 mmol/L 135 136 139  Potassium 3.5 - 5.1 mmol/L 4.5 4.3 4.5  Chloride 98 - 111 mmol/L 101 102 105  CO2 22 - 32 mmol/L 26 30 28   Calcium 8.9 - 10.3 mg/dL 4.0(N) 0.2(V) 9.3   CBC Latest Ref Rng & Units 08/04/2018 08/02/2018 06/25/2016  WBC 4.0 - 10.5 K/uL 9.8 8.4 8.9  Hemoglobin 13.0 - 17.0 g/dL 25.3 12.8(L) 14.0  Hematocrit 39.0 - 52.0 % 40.4 39.9 40.3  Platelets 150 - 400 K/uL 267 226 187   CXR: No new film  IMPRESSION: Acute/chronic hypoxemic and hypercarbic respiratory failure COPD exacerbation, resolving Smoker Severe protein-calorie malnutrition  PLAN/REC: Continue supplemental O2 Continue methylprednisolone through today Begin prednisone 2/24 Continue nebulized steroids and bronchodilators Continue empiric antibiotics.  Changed to doxycycline 2/23 Smoking cessation counseling Continue to push nutrition as much as tolerated Transfer to MedSurg floor After transfer, PCCM will sign off. Please call if we can be of further assistance   I will arrange for him to follow-up with pulmonary medicine in the office in 3-4 weeks from today  Billy Fischer, MD PCCM service Mobile [phone] Pager [phone] 08/04/2018 2:00 PM

## 2018-08-05 LAB — BASIC METABOLIC PANEL
Anion gap: 11 (ref 5–15)
BUN: 41 mg/dL — ABNORMAL HIGH (ref 8–23)
CHLORIDE: 100 mmol/L (ref 98–111)
CO2: 26 mmol/L (ref 22–32)
Calcium: 8.9 mg/dL (ref 8.9–10.3)
Creatinine, Ser: 0.79 mg/dL (ref 0.61–1.24)
GFR calc Af Amer: >60 mL/min (ref >60)
GFR calc non Af Amer: >60 mL/min (ref >60)
Glucose, Bld: 176 mg/dL — ABNORMAL HIGH (ref 70–99)
Potassium: 4.6 mmol/L (ref 3.5–5.1)
Sodium: 137 mmol/L (ref 135–145)

## 2018-08-05 LAB — CBC
HEMATOCRIT: 39.1 % (ref 39.0–52.0)
Hemoglobin: 12.7 g/dL — ABNORMAL LOW (ref 13.0–17.0)
MCH: 30.2 pg (ref 26.0–34.0)
MCHC: 32.5 g/dL (ref 30.0–36.0)
MCV: 92.9 fL (ref 80.0–100.0)
Platelets: 266 10*3/uL (ref 150–400)
RBC: 4.21 MIL/uL — ABNORMAL LOW (ref 4.22–5.81)
RDW: 13.6 % (ref 11.5–15.5)
WBC: 8.5 10*3/uL (ref 4.0–10.5)
nRBC: 0 % (ref 0.0–0.2)

## 2018-08-05 MED ORDER — PREDNISONE 20 MG PO TABS
40.0000 mg | ORAL_TABLET | Freq: Every day | ORAL | Status: DC
  Administered 2018-08-06 – 2018-08-07 (×2): 40 mg via ORAL
  Filled 2018-08-05 (×2): qty 2

## 2018-08-05 NOTE — Care Management Note (Signed)
Case Management Note  Patient Details  Name: Ian Herman MRN: [id number] Date of Birth: Feb 15, 1946  Subjective/Objective:  Becky Sax with [facility] has accepted the home health referral she is going to contact the [facility] and work on getting authorization for home health services.   Robbie Lis RN BSN [phone]                   Action/Plan:   Expected Discharge Date:                  Expected Discharge Plan:  Home w Home Health Services  In-House Referral:     Discharge planning Services  CM Consult  Post Acute Care Choice:  Home Health Choice offered to:  Patient  DME Arranged:    DME Agency:     HH Arranged:  RN, PT, OT, Nurse's Aide HH Agency:  [facility]  Status of Service:  In process, will continue to follow  If discussed at Long Length of Stay Meetings, dates discussed:    Additional Comments:  Allayne Butcher, RN 08/05/2018, 12:25 PM

## 2018-08-05 NOTE — Care Management Note (Addendum)
Case Management Note  Patient Details  Name: NAZIM SAFKO MRN: [id number] Date of Birth: 29-Dec-1945  Subjective/Objective:    Patient admitted with COPD exacerbation.  Patient initially required Bipap but is now on [place] at 1L.  Patient is a patient of the [facility]- he receives all of his PCP services with them and he gets his medications there.  Patient reports that at baseline he is independent and drives.  He is not on chronic O2 at home.  He does have a nebulizer that he uses 6 time per day.  He has never had home health services.  RNCM consulted for home health needs.  Patient is agreeable to home health services and has no preference.  RNCM reached out to Becky Sax with [facility] and left a message to see if they could accept [facility] referral.  [facility] notified of patient's admission- left voicemail with Steward Drone at ext [phone], consent to transfer faxed.  RNCM will cont to follow. Robbie Lis RN BSN [phone]                  Action/Plan:   Expected Discharge Date:                  Expected Discharge Plan:     In-House Referral:     Discharge planning Services     Post Acute Care Choice:    Choice offered to:     DME Arranged:    DME Agency:     HH Arranged:    HH Agency:     Status of Service:     If discussed at Long Length of Stay Meetings, dates discussed:    Additional Comments:  Allayne Butcher, RN 08/05/2018, 11:25 AM

## 2018-08-05 NOTE — Evaluation (Signed)
Physical Therapy Evaluation Patient Details Name: Ian Herman MRN: [id number] DOB: Jan 15, 1946 Today's Date: 08/05/2018   History of Present Illness  From MD H&P: The patient is a 73 yo male with past medical history of COPD, hypertension and CAD presented to the emergency department complaining of shortness of breath.  The patient was found to have oxygen saturation of 77% on room air.  He was placed on BiPAP and given Solu-Medrol as well as duo nebs which improved his respiratory rate.  Chest x-ray showed no active disease due to the patient's increased work of breathing and oxygen requirement the emergency department staff, hospitalist service for admission.  Assessment includes: Acute/chronic hypoxemic and hypercarbic respiratory failure, COPD exacerbation, and protein-calorie malnutrition.    Clinical Impression  Pt presents with deficits in strength, transfers, mobility, gait, and activity tolerance but overall performed well during the session.  Pt was Ind with bed mobility tasks and SBA with transfers with good control and stability.  Pt ambulated 100' with a RW and CGA with slow cadence and short B step length but was steady without LOB.  Pt was able to perform the below therex and ambulate with SpO2 and HR monitored continuously and remaining WNL.  Pt will benefit from HHPT services upon discharge to safely address above deficits for decreased risk of further functional decline and eventual return to PLOF.      Follow Up Recommendations Home health PT    Equipment Recommendations  Rolling walker with 5" wheels;Other (comment)(Pt may decline RW pending progress in acute care)    Recommendations for Other Services       Precautions / Restrictions Precautions Precautions: Fall Restrictions Weight Bearing Restrictions: No      Mobility  Bed Mobility Overal bed mobility: Independent                Transfers Overall transfer level: Needs assistance Equipment used:  Rolling walker (2 wheeled) Transfers: Sit to/from Stand Sit to Stand: Supervision         General transfer comment: Good control and stability with transfers  Ambulation/Gait Ambulation/Gait assistance: Min guard;+2 safety/equipment(+2 for equipment) Gait Distance (Feet): 100 Feet Assistive device: Rolling walker (2 wheeled) Gait Pattern/deviations: Step-through pattern;Decreased step length - right;Decreased step length - left;Trunk flexed     General Gait Details: Slow cadence and short B step length with a flexed trunk posture but with no noted instability  Stairs            Wheelchair Mobility    Modified Rankin (Stroke Patients Only)       Balance Overall balance assessment: No apparent balance deficits (not formally assessed)                                           Pertinent Vitals/Pain Pain Assessment: No/denies pain    Home Living Family/patient expects to be discharged to:: Private residence Living Arrangements: Alone Available Help at Discharge: Friend(s);Available PRN/intermittently Type of Home: House Home Access: Level entry     Home Layout: Two level;Able to live on main level with bedroom/bathroom Home Equipment: Gilmer Mor - single point      Prior Function Level of Independence: Independent with assistive device(s)         Comments: Mod Ind with amb limited community distances with a SPC with no fall history, Ind with ADLs     Hand Dominance  Extremity/Trunk Assessment   Upper Extremity Assessment Upper Extremity Assessment: Overall WFL for tasks assessed    Lower Extremity Assessment Lower Extremity Assessment: Generalized weakness       Communication   Communication: No difficulties  Cognition Arousal/Alertness: Awake/alert Behavior During Therapy: Flat affect Overall Cognitive Status: Within Functional Limits for tasks assessed                                        General  Comments      Exercises Total Joint Exercises Ankle Circles/Pumps: AROM;Both;10 reps Quad Sets: Strengthening;Both;10 reps Gluteal Sets: Strengthening;Both;10 reps Heel Slides: AROM;Both;10 reps Hip ABduction/ADduction: AROM;Both;10 reps Straight Leg Raises: AROM;Both;10 reps Long Arc Quad: AROM;Strengthening;Both;10 reps Knee Flexion: AROM;Strengthening;Both;10 reps   Assessment/Plan    PT Assessment Patient needs continued PT services  PT Problem List Decreased strength;Decreased activity tolerance       PT Treatment Interventions DME instruction;Gait training;Functional mobility training;Therapeutic activities;Therapeutic exercise;Balance training;Patient/family education    PT Goals (Current goals can be found in the Care Plan section)  Acute Rehab PT Goals Patient Stated Goal: To get back home PT Goal Formulation: With patient Time For Goal Achievement: 08/18/18 Potential to Achieve Goals: Good    Frequency Min 2X/week   Barriers to discharge        Co-evaluation               AM-PAC PT "6 Clicks" Mobility  Outcome Measure Help needed turning from your back to your side while in a flat bed without using bedrails?: None Help needed moving from lying on your back to sitting on the side of a flat bed without using bedrails?: None Help needed moving to and from a bed to a chair (including a wheelchair)?: A Little Help needed standing up from a chair using your arms (e.g., wheelchair or bedside chair)?: A Little Help needed to walk in hospital room?: A Little Help needed climbing 3-5 steps with a railing? : A Little 6 Click Score: 20    End of Session Equipment Utilized During Treatment: Gait belt;Oxygen Activity Tolerance: Patient tolerated treatment well Patient left: in bed;with bed alarm set;with call bell/phone within reach Nurse Communication: Mobility status PT Visit Diagnosis: Muscle weakness (generalized) (M62.81);Difficulty in walking, not elsewhere  classified (R26.2)    Time: 2703-5009 PT Time Calculation (min) (ACUTE ONLY): 30 min   Charges:   PT Evaluation $PT Eval Low Complexity: 1 Low PT Treatments $Therapeutic Exercise: 8-22 mins        D. Scott Lamont Tant PT, DPT 08/05/18, 1:16 PM

## 2018-08-05 NOTE — Care Management Note (Signed)
Case Management Note  Patient Details  Name: Ian Herman MRN: [id number] Date of Birth: 1946-06-09  Subjective/Objective:  RNCM received call back from the [facility].  Steward Drone from the transfer center reports that there is no need to transfer if patient will be discharged in the next 24/48 hours.   Robbie Lis RN BSNs [phone]                   Action/Plan:   Expected Discharge Date:                  Expected Discharge Plan:  Home w Home Health Services  In-House Referral:     Discharge planning Services  CM Consult  Post Acute Care Choice:  Home Health Choice offered to:  Patient  DME Arranged:    DME Agency:     HH Arranged:  RN, PT, OT, Nurse's Aide HH Agency:  [facility]  Status of Service:  In process, will continue to follow  If discussed at Long Length of Stay Meetings, dates discussed:    Additional Comments:  Allayne Butcher, RN 08/05/2018, 2:17 PM

## 2018-08-05 NOTE — Progress Notes (Signed)
[employer] - [place] at [facility]                                                                                                                                                                                  Patient Demographics   Ian Herman, is a 73 y.o. male, DOB - 05-Feb-1946, ANV:[id number]  Admit date - 08/02/2018   Admitting Physician Arnaldo Natal, MD  Outpatient Primary MD for the patient is Louanne Skye, MD   LOS - 3  Subjective: Patient off BiPAP on nasal cannula  Review of Systems:   CONSTITUTIONAL: No documented fever. No fatigue, weakness. No weight gain, no weight loss.  EYES: No blurry or double vision.  ENT: No tinnitus. No postnasal drip. No redness of the oropharynx.  RESPIRATORY: No cough, no wheeze, no hemoptysis.  Positive dyspnea.  CARDIOVASCULAR: No chest pain. No orthopnea. No palpitations. No syncope.  GASTROINTESTINAL: No nausea, no vomiting or diarrhea. No abdominal pain. No melena or hematochezia.  GENITOURINARY: No dysuria or hematuria.  ENDOCRINE: No polyuria or nocturia. No heat or cold intolerance.  HEMATOLOGY: No anemia. No bruising. No bleeding.  INTEGUMENTARY: No rashes. No lesions.  MUSCULOSKELETAL: No arthritis. No swelling. No gout.  NEUROLOGIC: No numbness, tingling, or ataxia. No seizure-type activity.  PSYCHIATRIC: No anxiety. No insomnia. No ADD.    Vitals:   Vitals:   08/05/18 1100 08/05/18 1200 08/05/18 1300 08/05/18 1348  BP:  (!) 141/75  (!) 145/73  Pulse: 77 84 90 94  Resp: 19 (!) 23 (!) 27 18  Temp:  98 F (36.7 C)  97.8 F (36.6 C)  TempSrc:  Oral  Oral  SpO2: 96% 99% 94% 92%  Weight:      Height:        Wt Readings from Last 3 Encounters:  08/05/18 49.5 kg  01/24/18 59 kg  01/02/18 57.2 kg     Intake/Output Summary (Last 24 hours) at 08/05/2018 1434 Last data filed at 08/05/2018 1300 Gross per 24 hour  Intake 720 ml  Output 1150 ml  Net -430 ml    Physical Exam:   GENERAL:  Pleasant-appearing in no apparent distress.  HEAD, EYES, EARS, NOSE AND THROAT: Atraumatic, normocephalic. Extraocular muscles are intact. Pupils equal and reactive to light. Sclerae anicteric. No conjunctival injection. No oro-pharyngeal erythema.  NECK: Supple. There is no jugular venous distention. No bruits, no lymphadenopathy, no thyromegaly.  HEART: Regular rate and rhythm,. No murmurs, no rubs, no clicks.  LUNGS: Decreased breath sounds bilaterally ABDOMEN: Soft, flat, nontender, nondistended. Has good bowel sounds. No hepatosplenomegaly appreciated.  EXTREMITIES: No evidence of any cyanosis, clubbing, or peripheral edema.  +2 pedal and  radial pulses bilaterally.  NEUROLOGIC: The patient is alert, awake, and oriented x3 with no focal motor or sensory deficits appreciated bilaterally.  SKIN: Moist and warm with no rashes appreciated.  Psych: Not anxious, depressed LN: No inguinal LN enlargement    Antibiotics   Anti-infectives (From admission, onward)   Start     Dose/Rate Route Frequency Ordered Stop   08/04/18 1000  doxycycline (VIBRA-TABS) tablet 100 mg     100 mg Oral Every 12 hours 08/04/18 0946 08/09/18 0959   08/02/18 1030  cefTRIAXone (ROCEPHIN) 1 g in sodium chloride 0.9 % 100 mL IVPB     1 g 200 mL/hr over 30 Minutes Intravenous Daily 08/02/18 1011 08/07/18 0959      Medications   Scheduled Meds: . aspirin EC  81 mg Oral Daily  . budesonide (PULMICORT) nebulizer solution  0.5 mg Nebulization BID  . chlorhexidine  15 mL Mouth Rinse BID  . cycloSPORINE  1 drop Both Eyes BID  . docusate sodium  100 mg Oral BID  . doxycycline  100 mg Oral Q12H  . enoxaparin (LOVENOX) injection  40 mg Subcutaneous Q24H  . feeding supplement (ENSURE ENLIVE)  237 mL Oral TID BM  . hydrALAZINE  50 mg Oral BID  . ipratropium-albuterol  3 mL Nebulization Q6H  . lisinopril  40 mg Oral Daily  . mouth rinse  15 mL Mouth Rinse q12n4p  . metoprolol succinate  25 mg Oral BID  . mirtazapine   15 mg Oral QHS  . multivitamin with minerals  1 tablet Oral Daily  . pantoprazole  40 mg Oral Daily  . [START ON 08/06/2018] predniSONE  40 mg Oral Q breakfast  . simvastatin  20 mg Oral QPM   Continuous Infusions: . cefTRIAXone (ROCEPHIN)  IV 1 g (08/05/18 1113)   PRN Meds:.acetaminophen **OR** [DISCONTINUED] acetaminophen, albuterol, cyclobenzaprine, guaiFENesin, ondansetron **OR** ondansetron (ZOFRAN) IV   Data Review:   Micro Results Recent Results (from the past 240 hour(s))  MRSA PCR Screening     Status: None   Collection Time: 08/02/18  9:46 AM  Result Value Ref Range Status   MRSA by PCR NEGATIVE NEGATIVE Final    Comment:        The GeneXpert MRSA Assay (FDA approved for NASAL specimens only), is one component of a comprehensive MRSA colonization surveillance program. It is not intended to diagnose MRSA infection nor to guide or monitor treatment for MRSA infections. Performed at [facility], [address] [place], [place] [address]     Radiology Reports Dg Chest Portable 1 View  Result Date: 08/02/2018 CLINICAL DATA:  Shortness of breath EXAM: PORTABLE CHEST 1 VIEW COMPARISON:  06/25/2016 FINDINGS: Hyperinflation with emphysematous disease. No acute consolidation or effusion. Normal heart size. Aortic atherosclerosis. No pneumothorax. IMPRESSION: No active disease.  Hyperinflation with emphysematous disease Electronically Signed   By: Jasmine PangKim  Fujinaga M.D.   On: 08/02/2018 01:54     CBC Recent Labs  Lab 08/02/18 0133 08/04/18 0542 08/05/18 0333  WBC 8.4 9.8 8.5  HGB 12.8* 13.0 12.7*  HCT 39.9 40.4 39.1  PLT 226 267 266  MCV 93.9 94.2 92.9  MCH 30.1 30.3 30.2  MCHC 32.1 32.2 32.5  RDW 13.6 13.9 13.6  LYMPHSABS 0.7  --   --   MONOABS 0.5  --   --   EOSABS 0.2  --   --   BASOSABS 0.0  --   --     Chemistries  Recent Labs  Lab 08/02/18 0133 08/04/18 0542 08/05/18 0333  NA 136 135 137  K 4.3 4.5 4.6  CL 102 101 100  CO2 30 26 26    GLUCOSE 110* 147* 176*  BUN 19 34* 41*  CREATININE 0.74 0.70 0.79  CALCIUM 8.6* 8.8* 8.9  MG  --  2.1  --   AST 17  --   --   ALT 13  --   --   ALKPHOS 74  --   --   BILITOT 0.4  --   --    ------------------------------------------------------------------------------------------------------------------ estimated creatinine clearance is 59.3 mL/min (by C-G formula based on SCr of 0.79 mg/dL). ------------------------------------------------------------------------------------------------------------------ No results for input(s): HGBA1C in the last 72 hours. ------------------------------------------------------------------------------------------------------------------ No results for input(s): CHOL, HDL, LDLCALC, TRIG, CHOLHDL, LDLDIRECT in the last 72 hours. ------------------------------------------------------------------------------------------------------------------ No results for input(s): TSH, T4TOTAL, T3FREE, THYROIDAB in the last 72 hours.  Invalid input(s): FREET3 ------------------------------------------------------------------------------------------------------------------ No results for input(s): VITAMINB12, FOLATE, FERRITIN, TIBC, IRON, RETICCTPCT in the last 72 hours.  Coagulation profile No results for input(s): INR, PROTIME in the last 168 hours.  No results for input(s): DDIMER in the last 72 hours.  Cardiac Enzymes Recent Labs  Lab 08/02/18 0133  TROPONINI <0.03   ------------------------------------------------------------------------------------------------------------------ Invalid input(s): POCBNP    Assessment & Plan   This is a 73 year old male admitted for respiratory failure. 1.    Acute on chronic respiratory failure: Acute on chronic; with hypoxia and hypercapnia.  Improved continue therapy for COPD exasperation with nebs steroids 2.  Hypertension:continue lisinopril, hydralazine and metoprolol. 3.  CAD: Stable; continue aspirin.   Monitor telemetry 4.  Hyperlipidemia: Continue statin therapy 5.  Hepatitis C: Stable 6.  DVT prophylaxis: Lovenox 7.  GI prophylaxis: Pantoprazole per home regimen      Code Status Orders  (From admission, onward)         Start     Ordered   08/02/18 0741  Full code  Continuous     08/02/18 0740        Code Status History    This patient has a current code status but no historical code status.           Consults pulmonary critical care  DVT Prophylaxis  Lovenox  Lab Results  Component Value Date   PLT 266 08/05/2018     Time Spent in minutes 35 minutes greater than 50% of time spent in care coordination and counseling patient regarding the condition and plan of care.   Auburn Bilberry M.D on 08/05/2018 at 2:34 PM  Between 7am to 6pm - Pager - [phone]  After 6pm go to [email or web address] - Social research officer, government  [employer]   Office  [phone]

## 2018-08-06 DIAGNOSIS — Z515 Encounter for palliative care: Secondary | ICD-10-CM

## 2018-08-06 DIAGNOSIS — Z7189 Other specified counseling: Secondary | ICD-10-CM

## 2018-08-06 DIAGNOSIS — J441 Chronic obstructive pulmonary disease with (acute) exacerbation: Secondary | ICD-10-CM

## 2018-08-06 DIAGNOSIS — F411 Generalized anxiety disorder: Secondary | ICD-10-CM

## 2018-08-06 MED ORDER — MORPHINE SULFATE 10 MG/5ML PO SOLN
5.0000 mg | ORAL | Status: DC | PRN
  Administered 2018-08-06 – 2018-08-07 (×2): 5 mg via ORAL
  Filled 2018-08-06 (×2): qty 5

## 2018-08-06 MED ORDER — SENNA 8.6 MG PO TABS
1.0000 | ORAL_TABLET | Freq: Two times a day (BID) | ORAL | Status: DC
  Administered 2018-08-06 – 2018-08-07 (×3): 8.6 mg via ORAL
  Filled 2018-08-06 (×3): qty 1

## 2018-08-06 NOTE — Progress Notes (Signed)
New referral for outpatient Palliative to follow at home received following a Palliative Medicine consult. CMRN Ermalene Searing aware. Patient information faxed to referral. Dayna Barker BSN, RN, [facility] Clinical Liaison [facility] [phone]

## 2018-08-06 NOTE — Care Management (Signed)
Patient moved out of icu 2/24.  He is to be followed by outpatient palliative with [facility] at discharge.  Discussed during rounds of need for home oxygen assessment prior to discharge.
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
## 2018-08-06 NOTE — Progress Notes (Signed)
[employer] - [facility] at [facility]                                                                                                                                                                                  Patient Demographics   Ian Herman, is a 73 y.o. male, DOB - [birth year], DUK:[id number]  Admit date - 08/02/2018   Admitting Physician Arnaldo Natal, MD  Outpatient Primary MD for the patient is Louanne Skye, MD   LOS - 4  Subjective: Continues to be short of breath, wanting to go home  Review of Systems:   CONSTITUTIONAL: No documented fever. No fatigue, weakness. No weight gain, no weight loss.  EYES: No blurry or double vision.  ENT: No tinnitus. No postnasal drip. No redness of the oropharynx.  RESPIRATORY: No cough, no wheeze, no hemoptysis.  Positive dyspnea.  CARDIOVASCULAR: No chest pain. No orthopnea. No palpitations. No syncope.  GASTROINTESTINAL: No nausea, no vomiting or diarrhea. No abdominal pain. No melena or hematochezia.  GENITOURINARY: No dysuria or hematuria.  ENDOCRINE: No polyuria or nocturia. No heat or cold intolerance.  HEMATOLOGY: No anemia. No bruising. No bleeding.  INTEGUMENTARY: No rashes. No lesions.  MUSCULOSKELETAL: No arthritis. No swelling. No gout.  NEUROLOGIC: No numbness, tingling, or ataxia. No seizure-type activity.  PSYCHIATRIC: No anxiety. No insomnia. No ADD.    Vitals:   Vitals:   08/05/18 2002 08/05/18 2233 08/06/18 0532 08/06/18 0821  BP: (!) 144/84 (!) 148/78 (!) 143/84 (!) 144/92  Pulse: 84 84 80 76  Resp: 20  20 20   Temp: 98.5 F (36.9 C)  97.9 F (36.6 C) 97.7 F (36.5 C)  TempSrc:    Oral  SpO2: 92% 94% 95% 94%  Weight:      Height:        Wt Readings from Last 3 Encounters:  08/05/18 49.5 kg  01/24/18 59 kg  01/02/18 57.2 kg     Intake/Output Summary (Last 24 hours) at 08/06/2018 1230 Last data filed at 08/06/2018 0954 Gross per 24 hour  Intake 840 ml  Output 1100 ml  Net  -260 ml    Physical Exam:   GENERAL: Pleasant-appearing in no apparent distress.  HEAD, EYES, EARS, NOSE AND THROAT: Atraumatic, normocephalic. Extraocular muscles are intact. Pupils equal and reactive to light. Sclerae anicteric. No conjunctival injection. No oro-pharyngeal erythema.  NECK: Supple. There is no jugular venous distention. No bruits, no lymphadenopathy, no thyromegaly.  HEART: Regular rate and rhythm,. No murmurs, no rubs, no clicks.  LUNGS: Decreased breath sounds bilaterally ABDOMEN: Soft, flat, nontender, nondistended. Has good bowel sounds. No hepatosplenomegaly appreciated.  EXTREMITIES: No evidence of any cyanosis, clubbing,  or peripheral edema.  +2 pedal and radial pulses bilaterally.  NEUROLOGIC: The patient is alert, awake, and oriented x3 with no focal motor or sensory deficits appreciated bilaterally.  SKIN: Moist and warm with no rashes appreciated.  Psych: Not anxious, depressed LN: No inguinal LN enlargement    Antibiotics   Anti-infectives (From admission, onward)   Start     Dose/Rate Route Frequency Ordered Stop   08/04/18 1000  doxycycline (VIBRA-TABS) tablet 100 mg     100 mg Oral Every 12 hours 08/04/18 0946 08/09/18 0959   08/02/18 1030  cefTRIAXone (ROCEPHIN) 1 g in sodium chloride 0.9 % 100 mL IVPB     1 g 200 mL/hr over 30 Minutes Intravenous Daily 08/02/18 1011 08/06/18 1028      Medications   Scheduled Meds: . aspirin EC  81 mg Oral Daily  . budesonide (PULMICORT) nebulizer solution  0.5 mg Nebulization BID  . chlorhexidine  15 mL Mouth Rinse BID  . cycloSPORINE  1 drop Both Eyes BID  . docusate sodium  100 mg Oral BID  . doxycycline  100 mg Oral Q12H  . enoxaparin (LOVENOX) injection  40 mg Subcutaneous Q24H  . feeding supplement (ENSURE ENLIVE)  237 mL Oral TID BM  . hydrALAZINE  50 mg Oral BID  . ipratropium-albuterol  3 mL Nebulization Q6H  . lisinopril  40 mg Oral Daily  . mouth rinse  15 mL Mouth Rinse q12n4p  . metoprolol  succinate  25 mg Oral BID  . mirtazapine  15 mg Oral QHS  . multivitamin with minerals  1 tablet Oral Daily  . pantoprazole  40 mg Oral Daily  . predniSONE  40 mg Oral Q breakfast  . senna  1 tablet Oral BID  . simvastatin  20 mg Oral QPM   Continuous Infusions:  PRN Meds:.acetaminophen **OR** [DISCONTINUED] acetaminophen, albuterol, cyclobenzaprine, guaiFENesin, morphine, ondansetron **OR** ondansetron (ZOFRAN) IV   Data Review:   Micro Results Recent Results (from the past 240 hour(s))  MRSA PCR Screening     Status: None   Collection Time: 08/02/18  9:46 AM  Result Value Ref Range Status   MRSA by PCR NEGATIVE NEGATIVE Final    Comment:        The GeneXpert MRSA Assay (FDA approved for NASAL specimens only), is one component of a comprehensive MRSA colonization surveillance program. It is not intended to diagnose MRSA infection nor to guide or monitor treatment for MRSA infections. Performed at [facility], [address], [place], [place] [address]     Radiology Reports Dg Chest Portable 1 View  Result Date: 08/02/2018 CLINICAL DATA:  Shortness of breath EXAM: PORTABLE CHEST 1 VIEW COMPARISON:  06/25/2016 FINDINGS: Hyperinflation with emphysematous disease. No acute consolidation or effusion. Normal heart size. Aortic atherosclerosis. No pneumothorax. IMPRESSION: No active disease.  Hyperinflation with emphysematous disease Electronically Signed   By: Jasmine Pang M.D.   On: 08/02/2018 01:54     CBC Recent Labs  Lab 08/02/18 0133 08/04/18 0542 08/05/18 0333  WBC 8.4 9.8 8.5  HGB 12.8* 13.0 12.7*  HCT 39.9 40.4 39.1  PLT 226 267 266  MCV 93.9 94.2 92.9  MCH 30.1 30.3 30.2  MCHC 32.1 32.2 32.5  RDW 13.6 13.9 13.6  LYMPHSABS 0.7  --   --   MONOABS 0.5  --   --   EOSABS 0.2  --   --   BASOSABS 0.0  --   --     Chemistries  Recent Labs  Lab 08/02/18 0133 08/04/18 0542 08/05/18 0333  NA 136 135 137  K 4.3 4.5 4.6  CL 102 101 100  CO2 30  26 26   GLUCOSE 110* 147* 176*  BUN 19 34* 41*  CREATININE 0.74 0.70 0.79  CALCIUM 8.6* 8.8* 8.9  MG  --  2.1  --   AST 17  --   --   ALT 13  --   --   ALKPHOS 74  --   --   BILITOT 0.4  --   --    ------------------------------------------------------------------------------------------------------------------ estimated creatinine clearance is 59.3 mL/min (by C-G formula based on SCr of 0.79 mg/dL). ------------------------------------------------------------------------------------------------------------------ No results for input(s): HGBA1C in the last 72 hours. ------------------------------------------------------------------------------------------------------------------ No results for input(s): CHOL, HDL, LDLCALC, TRIG, CHOLHDL, LDLDIRECT in the last 72 hours. ------------------------------------------------------------------------------------------------------------------ No results for input(s): TSH, T4TOTAL, T3FREE, THYROIDAB in the last 72 hours.  Invalid input(s): FREET3 ------------------------------------------------------------------------------------------------------------------ No results for input(s): VITAMINB12, FOLATE, FERRITIN, TIBC, IRON, RETICCTPCT in the last 72 hours.  Coagulation profile No results for input(s): INR, PROTIME in the last 168 hours.  No results for input(s): DDIMER in the last 72 hours.  Cardiac Enzymes Recent Labs  Lab 08/02/18 0133  TROPONINI <0.03   ------------------------------------------------------------------------------------------------------------------ Invalid input(s): POCBNP    Assessment & Plan   This is a 73 year old male admitted for respiratory failure. 1.    Acute on chronic respiratory failure: Acute on chronic; with hypoxia and hypercapnia.  Improved continue therapy for COPD exasperation with nebs steroids, continue Pulmicort 2.  Hypertension:continue lisinopril, hydralazine and metoprolol. 3.  CAD: Stable;  continue aspirin.  Monitor telemetry 4.  Hyperlipidemia: Continue statin therapy 5.  Hepatitis C: Stable 6.  DVT prophylaxis: Lovenox 7.  GI prophylaxis: Pantoprazole per home regimen   Anticipate discharge tomorrow      Code Status Orders  (From admission, onward)         Start     Ordered   08/02/18 0741  Full code  Continuous     08/02/18 0740        Code Status History    This patient has a current code status but no historical code status.           Consults pulmonary critical care  DVT Prophylaxis  Lovenox  Lab Results  Component Value Date   PLT 266 08/05/2018     Time Spent in minutes 35 minutes greater than 50% of time spent in care coordination and counseling patient regarding the condition and plan of care.   Auburn Bilberry M.D on 08/06/2018 at 12:30 PM  Between 7am to 6pm - Pager - [phone]  After 6pm go to [email or web address] - Social research officer, government  [employer]   Office  [phone]

## 2018-08-07 MED ORDER — DOXYCYCLINE HYCLATE 100 MG PO TABS: 100.0000 mg | ORAL_TABLET | Freq: Two times a day (BID) | ORAL | 0 refills | Status: AC

## 2018-08-07 MED ORDER — MORPHINE SULFATE 10 MG/5ML PO SOLN: 5.0000 mg | ORAL | 0 refills | Status: AC | PRN

## 2018-08-07 MED ORDER — PREDNISONE 10 MG (21) PO TBPK: ORAL_TABLET | ORAL | 0 refills | Status: DC

## 2018-08-07 MED ORDER — BUDESONIDE 0.5 MG/2ML IN SUSP: 0.5000 mg | Freq: Two times a day (BID) | RESPIRATORY_TRACT | 0 refills | Status: DC

## 2018-08-07 MED ORDER — IPRATROPIUM-ALBUTEROL 0.5-2.5 (3) MG/3ML IN SOLN: 3.0000 mL | Freq: Four times a day (QID) | RESPIRATORY_TRACT | 0 refills | Status: AC

## 2018-08-07 MED ORDER — DOXYCYCLINE HYCLATE 100 MG PO TABS: 100.0000 mg | ORAL_TABLET | Freq: Two times a day (BID) | ORAL | 0 refills | Status: DC

## 2018-08-07 MED ORDER — IPRATROPIUM-ALBUTEROL 0.5-2.5 (3) MG/3ML IN SOLN: 3.0000 mL | Freq: Four times a day (QID) | RESPIRATORY_TRACT | 0 refills | Status: DC

## 2018-08-07 NOTE — Care Management Important Message (Signed)
Important Message  Patient Details  Name: Ian Herman MRN: [id number] Date of Birth: [birth year]   Medicare Important Message Given:  Yes    Olegario Messier A Briton Sellman 08/07/2018, 10:01 AM

## 2018-08-07 NOTE — Discharge Summary (Signed)
[employer] - [place] at [facility], 73 y.o., DOB Oct 05, 1945, MRN [id number]. Admission date: 08/02/2018 Discharge Date 08/07/2018 Primary MD Louanne Skye, MD Admitting Physician Arnaldo Natal, MD  Admission Diagnosis  COPD exacerbation [facility]) [J44.1] Acute on chronic respiratory failure with hypoxemia [facility]) [J96.21]  Discharge Diagnosis   Active Problems: Acute on chronic respiratory failure  Acute on chronic COPD exacerbation Hypertension Coronary artery disease Hyperlipidemia Chronic hepatitis C  Hospital Course The patient with past medical history of COPD, hypertension and CAD presents to the emergency department complaining of shortness of breath.  Patient initially required BiPAP and in the ICU.  His chest x-ray showed no acute abnormality.  Patient continued to be very short of breath requiring oxygen.  He was seen in consultation by palliative care team who recommended a DNR however patient wanted to be a full code.  His breathing is still labile.  He is very anxious to go home and wanting to go home.     Palliative care team to follow patient at home    Consults  pulmonary/intensive care  Significant Tests:  See full reports for all details    Dg Chest Portable 1 View  Result Date: 08/02/2018 CLINICAL DATA:  Shortness of breath EXAM: PORTABLE CHEST 1 VIEW COMPARISON:  06/25/2016 FINDINGS: Hyperinflation with emphysematous disease. No acute consolidation or effusion. Normal heart size. Aortic atherosclerosis. No pneumothorax. IMPRESSION: No active disease.  Hyperinflation with emphysematous disease Electronically Signed   By: Jasmine Pang M.D.   On: 08/02/2018 01:54       Today   Subjective:   Ian Herman patient very anxious to go home still with some shortness of breath  Objective:   Blood pressure (!) 154/84, pulse 71, temperature 97.9 F (36.6 C), temperature source Oral, resp. rate 18, height 5\' 8"  (1.727 m),  weight 49.4 kg, SpO2 93 %.  .  Intake/Output Summary (Last 24 hours) at 08/07/2018 1355 Last data filed at 08/07/2018 1013 Gross per 24 hour  Intake 720 ml  Output -  Net 720 ml    Exam VITAL SIGNS: Blood pressure (!) 154/84, pulse 71, temperature 97.9 F (36.6 C), temperature source Oral, resp. rate 18, height 5\' 8"  (1.727 m), weight 49.4 kg, SpO2 93 %.  GENERAL:  73 y.o.-year-old patient lying in the bed with no acute distress.  EYES: Pupils equal, round, reactive to light and accommodation. No scleral icterus. Extraocular muscles intact.  HEENT: Head atraumatic, normocephalic. Oropharynx and nasopharynx clear.  NECK:  Supple, no jugular venous distention. No thyroid enlargement, no tenderness.  LUNGS: Normal breath sounds bilaterally, no wheezing, rales,rhonchi or crepitation. No use of accessory muscles of respiration.  CARDIOVASCULAR: S1, S2 normal. No murmurs, rubs, or gallops.  ABDOMEN: Soft, nontender, nondistended. Bowel sounds present. No organomegaly or mass.  EXTREMITIES: No pedal edema, cyanosis, or clubbing.  NEUROLOGIC: Cranial nerves II through XII are intact. Muscle strength 5/5 in all extremities. Sensation intact. Gait not checked.  PSYCHIATRIC: The patient is alert and oriented x 3.  SKIN: No obvious rash, lesion, or ulcer.   Data Review     CBC w Diff:  Lab Results  Component Value Date   WBC 8.5 08/05/2018   HGB 12.7 (L) 08/05/2018   HGB 14.2 03/15/2013   HCT 39.1 08/05/2018   HCT 40.2 03/15/2013   PLT 266 08/05/2018   PLT 172 03/15/2013   LYMPHOPCT 8 08/02/2018   MONOPCT 6 08/02/2018   EOSPCT 2 08/02/2018  BASOPCT 0 08/02/2018   CMP:  Lab Results  Component Value Date   NA 137 08/05/2018   NA 139 03/15/2013   K 4.6 08/05/2018   K 3.8 03/15/2013   CL 100 08/05/2018   CL 104 03/15/2013   CO2 26 08/05/2018   CO2 27 03/15/2013   BUN 41 (H) 08/05/2018   BUN 10 03/15/2013   CREATININE 0.79 08/05/2018   CREATININE 1.03 03/15/2013   PROT 7.6  08/02/2018   PROT 6.9 04/02/2018   PROT 7.2 03/15/2013   ALBUMIN 4.1 08/02/2018   ALBUMIN 4.6 04/02/2018   ALBUMIN 3.5 03/15/2013   BILITOT 0.4 08/02/2018   BILITOT 0.6 04/02/2018   BILITOT 0.6 03/15/2013   ALKPHOS 74 08/02/2018   ALKPHOS 87 03/15/2013   AST 17 08/02/2018   AST 16 03/15/2013   ALT 13 08/02/2018   ALT 18 03/15/2013  .  Micro Results Recent Results (from the past 240 hour(s))  MRSA PCR Screening     Status: None   Collection Time: 08/02/18  9:46 AM  Result Value Ref Range Status   MRSA by PCR NEGATIVE NEGATIVE Final    Comment:        The GeneXpert MRSA Assay (FDA approved for NASAL specimens only), is one component of a comprehensive MRSA colonization surveillance program. It is not intended to diagnose MRSA infection nor to guide or monitor treatment for MRSA infections. Performed at [facility], [address] [place], [place] [address]         Code Status Orders  (From admission, onward)         Start     Ordered   08/02/18 0741  Full code  Continuous     08/02/18 0740        Code Status History    This patient has a current code status but no historical code status.          Follow-up Information    Louanne Skye, MD. Schedule an appointment as soon as possible for a visit in 6 days.   Specialty:  Family Medicine Why:  Patient to call and make follow up appt  Contact information: [address] [place] [address] [phone]           Discharge Medications   Allergies as of 08/07/2018      Reactions   Codeine Itching   itching   Thorazine [chlorpromazine] Hives   Hives      Medication List    TAKE these medications   albuterol 108 (90 Base) MCG/ACT inhaler Commonly known as:  PROVENTIL HFA;VENTOLIN HFA Inhale 2 puffs into the lungs every 6 (six) hours as needed. wheezing   aspirin EC 81 MG tablet Take 81 mg by mouth daily.   budesonide 0.5 MG/2ML nebulizer solution Commonly known  as:  PULMICORT Take 2 mLs (0.5 mg total) by nebulization 2 (two) times daily for 30 days.   cyclobenzaprine 5 MG tablet Commonly known as:  FLEXERIL Take 1 tablet (5 mg total) by mouth 3 (three) times daily as needed for muscle spasms.   cycloSPORINE 0.05 % ophthalmic emulsion Commonly known as:  RESTASIS Place 1 drop into both eyes 2 (two) times daily.   doxycycline 100 MG tablet Commonly known as:  VIBRA-TABS Take 1 tablet (100 mg total) by mouth every 12 (twelve) hours for 7 days.   etodolac 500 MG tablet Commonly known as:  LODINE Take by mouth.   guaiFENesin 600 MG 12 hr tablet Commonly  known as:  MUCINEX Take by mouth 2 (two) times daily as needed.   hydrALAZINE 50 MG tablet Commonly known as:  APRESOLINE Take 50 mg by mouth 2 (two) times daily.   ipratropium-albuterol 0.5-2.5 (3) MG/3ML Soln Commonly known as:  DUONEB Take 3 mLs by nebulization every 6 (six) hours for 3 days.   lisinopril 40 MG tablet Commonly known as:  PRINIVIL,ZESTRIL Take 40 mg by mouth daily.   metoprolol succinate 25 MG 24 hr tablet Commonly known as:  TOPROL-XL Take 25 mg by mouth 2 (two) times daily.   mirtazapine 15 MG tablet Commonly known as:  REMERON Take 15 mg by mouth at bedtime.   omeprazole 20 MG capsule Commonly known as:  PRILOSEC Take 20 mg by mouth daily.   predniSONE 10 MG (21) Tbpk tablet Commonly known as:  STERAPRED UNI-PAK 21 TAB Start at  taper by  until complete   simvastatin 20 MG tablet Commonly known as:  ZOCOR Take 20 mg by mouth every evening.            Durable Medical Equipment  (From admission, onward)         Start     Ordered   08/07/18 1225  DME Oxygen  Once    Question Answer Comment  Mode or (Route) Nasal cannula   Liters per Minute 2   Frequency Continuous (stationary and portable oxygen unit needed)   Oxygen delivery system Gas      08/07/18 1226             Total Time in preparing paper work, data evaluation and  todays exam - 35 minutes  Auburn Bilberry M.D on 08/07/2018 at 1:55 PM [employer]   Office  [phone]

## 2018-08-07 NOTE — Care Management (Signed)
Discharge to home today per Dr. Allena Katz. 3 days of prescriptions faxed to [facility]. Prescriptions faxed to Vernona Rieger RN at [facility]              (Fax: [phone]).  Will see if Ian Herman qualifies for home oxygen prior to discharge.  Becky Sax, [facility] representative up dated for Home Health needs.  Friend will transport. Gwenette Greet RN MSN CCM Care Management [phone]

## 2018-08-07 NOTE — Progress Notes (Signed)
PT Cancellation Note  Patient Details Name: Ian Herman MRN: [id number] DOB: 1946/05/22   Cancelled Treatment:    Reason Eval/Treat Not Completed: Patient declined, no reason specified   Offered and encouraged session this am.  Pt refused stating he was not interested in therapy today.  Pt asking about discharge and wanting his keys, wallet etc.  Charge nurse notified.   Danielle Dess 08/07/2018, 10:04 AM

## 2018-08-07 NOTE — Care Management Important Message (Signed)
Important Message  Patient Details  Name: Ian Herman MRN: [id number] Date of Birth: 05/25/1946   Medicare Important Message Given:  Yes    [name] 08/07/2018, 10:01 AM 

## 2018-08-07 NOTE — Progress Notes (Signed)
Daily Progress Note   Patient Name: Ian Herman       Date: 08/07/2018 DOB: 1946-05-26  Age: 73 y.o. MRN#: [id number] Attending Physician: Auburn Bilberry, MD Primary Care Physician: Louanne Skye, MD Admit Date: 08/02/2018  Reason for Consultation/Follow-up: Establishing goals of care  Subjective: Patient in bed- anxiously awaiting to be discharged. He reports relief of SOB symptom after receiving dose or oral morphine solution yesterday.  He notes feeling "tight" currently. He is also anxious regarding a phone call he received regarding his rental car, and trying to get his phone charger back from dietary service.   ROS  Length of Stay: 5  Current Medications: Scheduled Meds:  . aspirin EC  81 mg Oral Daily  . budesonide (PULMICORT) nebulizer solution  0.5 mg Nebulization BID  . chlorhexidine  15 mL Mouth Rinse BID  . cycloSPORINE  1 drop Both Eyes BID  . docusate sodium  100 mg Oral BID  . doxycycline  100 mg Oral Q12H  . enoxaparin (LOVENOX) injection  40 mg Subcutaneous Q24H  . feeding supplement (ENSURE ENLIVE)  237 mL Oral TID BM  . hydrALAZINE  50 mg Oral BID  . ipratropium-albuterol  3 mL Nebulization Q6H  . lisinopril  40 mg Oral Daily  . mouth rinse  15 mL Mouth Rinse q12n4p  . metoprolol succinate  25 mg Oral BID  . mirtazapine  15 mg Oral QHS  . multivitamin with minerals  1 tablet Oral Daily  . pantoprazole  40 mg Oral Daily  . predniSONE  40 mg Oral Q breakfast  . senna  1 tablet Oral BID  . simvastatin  20 mg Oral QPM    Continuous Infusions:   PRN Meds: acetaminophen **OR** [DISCONTINUED] acetaminophen, albuterol, cyclobenzaprine, guaiFENesin, morphine, ondansetron **OR** ondansetron (ZOFRAN) IV  Physical Exam          Vital Signs: BP (!)  154/84 (BP Location: Left Arm)   Pulse 71   Temp 97.9 F (36.6 C) (Oral)   Resp 18   Ht 5\' 8"  (1.727 m)   Wt 49.4 kg   SpO2 91%   BMI 16.56 kg/m  SpO2: SpO2: 91 % O2 Device: O2 Device: Nasal Cannula O2 Flow Rate: O2 Flow Rate (L/min): 2 L/min  Intake/output summary:   Intake/Output Summary (Last 24 hours) at  08/07/2018 1050 Last data filed at 08/07/2018 1013 Gross per 24 hour  Intake 720 ml  Output -  Net 720 ml   LBM: Last BM Date: 07/31/18 Baseline Weight: Weight: 54.4 kg Most recent weight: Weight: 49.4 kg       Palliative Assessment/Data: PPS: 70%    Flowsheet Rows     Most Recent Value  Intake Tab  Referral Department  Critical care  Unit at Time of Referral  ICU  Palliative Care Primary Diagnosis  Pulmonary  Date Notified  08/04/18  Palliative Care Type  New Palliative care  Reason for referral  Clarify Goals of Care  Date of Admission  08/02/18  # of days IP prior to Palliative referral  2  Clinical Assessment  Psychosocial & Spiritual Assessment  Palliative Care Outcomes      Patient Active Problem List   Diagnosis Date Noted  . COPD exacerbation (HCC)   . Anxiety state   . Advanced care planning/counseling discussion   . Goals of care, counseling/discussion   . Palliative care by specialist   . Acute on chronic respiratory failure with hypoxia and hypercapnia (HCC) 08/02/2018  . Protein-calorie malnutrition, severe 08/02/2018    Palliative Care Assessment & Plan   Patient Profile: 73 y.o. 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 Now off bipap. Ambulating well with PT. Palliative medicine consulted for GOC.    Assessment/Recommendations/Plan   Continue 5mg  morphine q2hr prn for SOB- recommend as premed for ADL's- discharging physician please continue  Palliative medicine outpatient referral made     Goals of Care and Additional Recommendations:  Limitations on Scope of Treatment: Full Scope Treatment  Code Status:  Full code  Prognosis:   Unable to determine  Discharge Planning:  Home with Palliative Services  Care plan was discussed with patient and Dr. Allena Katz.  Thank you for allowing the Palliative Medicine Team to assist in the care of this patient.   Time In: 1020 Time Out: 1055 Total Time 35 minutes Prolonged Time Billed no      Greater than 50%  of this time was spent counseling and coordinating care related to the above assessment and plan.  Ocie Bob, AGNP-C Palliative Medicine   Please contact Palliative Medicine Team phone at [phone] for questions and concerns.

## 2018-08-12 ENCOUNTER — Telehealth: Payer: Self-pay

## 2018-08-12 NOTE — Telephone Encounter (Signed)
-----   Message from [place] sent at 08/09/2018  4:55 PM EST ----- Regarding: Hospital f.u appt. Called pt and he did not want to speak at the moment. He said he just got out the hospital and can't do anything and would like [place] to call back Monday.  ----- Message ----- From: Erlinda Hong, RN Sent: 08/09/2018   4:26 PM EST To: Francella Solian  Could you please schedule this patient? HFU 30 minutes.  Thanks! ----- Message ----- From: Merwyn Katos, MD Sent: 08/04/2018   9:47 AM EST To: Janean Sark, CMA, Erlinda Hong, RN  Please schedule follow up in office with any provider in 3-4 weeks  Thanks  Theodoro Grist

## 2018-08-12 NOTE — Telephone Encounter (Signed)
Called and spoke to pt.  Pt states that a home health nurse will be coming by his house at 10:00 today.  Pt would like to discuss scheduling apt with nurse today. Pt requested that our office contact him on 08/13/18 to discuss this matter.

## 2018-08-13 NOTE — Telephone Encounter (Signed)
Called pt on 08/13/18 to schedule H/F but no answer. Left vm

## 2018-08-14 NOTE — Telephone Encounter (Signed)
Spoke to patient, he stated that he does not have any insurance and does not have any money so he doesn't want to make an apt.   When trying to complete call, patient raised his voice and demanded that we DO NOT call him ever again.

## 2019-11-02 ENCOUNTER — Emergency Department
Admission: EM | Admit: 2019-11-02 | Discharge: 2019-11-02 | Disposition: A | Discharge status: Home / Self Care | Payer: No Typology Code available for payment source | Attending: Emergency Medicine | Admitting: Emergency Medicine

## 2019-11-02 ENCOUNTER — Emergency Department: Payer: No Typology Code available for payment source

## 2019-11-02 ENCOUNTER — Encounter: Payer: Self-pay | Admitting: Emergency Medicine

## 2019-11-02 ENCOUNTER — Other Ambulatory Visit: Payer: Self-pay

## 2019-11-02 DIAGNOSIS — F17210 Nicotine dependence, cigarettes, uncomplicated: Secondary | ICD-10-CM | POA: Insufficient documentation

## 2019-11-02 DIAGNOSIS — I110 Hypertensive heart disease with heart failure: Secondary | ICD-10-CM | POA: Diagnosis not present

## 2019-11-02 DIAGNOSIS — I509 Heart failure, unspecified: Secondary | ICD-10-CM | POA: Diagnosis not present

## 2019-11-02 DIAGNOSIS — M79645 Pain in left finger(s): Secondary | ICD-10-CM | POA: Diagnosis present

## 2019-11-02 DIAGNOSIS — F4310 Post-traumatic stress disorder, unspecified: Secondary | ICD-10-CM | POA: Diagnosis not present

## 2019-11-02 DIAGNOSIS — J449 Chronic obstructive pulmonary disease, unspecified: Secondary | ICD-10-CM | POA: Diagnosis not present

## 2019-11-02 MED ORDER — KETOROLAC TROMETHAMINE 30 MG/ML IJ SOLN
30.0000 mg | Freq: Once | INTRAMUSCULAR | Status: AC
  Administered 2019-11-02: 30 mg via INTRAMUSCULAR
  Filled 2019-11-02: qty 1

## 2019-11-02 MED ORDER — TRAMADOL HCL 50 MG PO TABS
50.0000 mg | ORAL_TABLET | Freq: Once | ORAL | Status: AC
  Administered 2019-11-02: 50 mg via ORAL
  Filled 2019-11-02: qty 1

## 2019-11-02 MED ORDER — PREDNISONE 10 MG PO TABS: ORAL_TABLET | ORAL | 0 refills | Status: DC

## 2019-11-02 MED ORDER — MELOXICAM 7.5 MG PO TABS: 7.5000 mg | ORAL_TABLET | Freq: Every day | ORAL | 0 refills | Status: AC

## 2019-11-02 MED ORDER — PREDNISONE 10 MG PO TABS: 40.0000 mg | ORAL_TABLET | Freq: Every day | ORAL | 0 refills | Status: DC

## 2019-11-02 NOTE — ED Provider Notes (Signed)
[facility] Emergency Department Provider Note  ____________________________________________  Time seen: Approximately 1:09 PM  I have reviewed the triage vital signs and the nursing notes.   HISTORY  Chief Complaint Hand Injury    HPI Ian Herman is a 74 y.o. male that presents to the emergency department for evaluation of left thumb pain to the base of his thumb since last night.  Patient denies any trauma.  He has been told before that he has gout and arthritis.  No fevers.  He receives his care from the [place].   Past Medical History:  Diagnosis Date  . Arthritis   . Bronchitis   . CHF (congestive heart failure) (HCC)   . COPD (chronic obstructive pulmonary disease) (HCC)   . Coronary artery disease   . Depression   . GERD (gastroesophageal reflux disease)   . Hepatitis C   . Hypertension   . Myocardial infarction acute (HCC)   . Pleural effusion   . PTSD (post-traumatic stress disorder)   . Shortness of breath dyspnea     Patient Active Problem List   Diagnosis Date Noted  . COPD exacerbation (HCC)   . Anxiety state   . Advanced care planning/counseling discussion   . Goals of care, counseling/discussion   . Palliative care by specialist   . Acute on chronic respiratory failure with hypoxia and hypercapnia (HCC) 08/02/2018  . Protein-calorie malnutrition, severe 08/02/2018    Past Surgical History:  Procedure Laterality Date  . APPENDECTOMY    . CORONARY ANGIOPLASTY     stent  . INGUINAL HERNIA REPAIR Left 04/23/2015   Procedure: HERNIA REPAIR INGUINAL ADULT;  Surgeon: Nadeen Landau, MD;  Location: [facility] ORS;  Service: General;  Laterality: Left;    Prior to Admission medications   Medication Sig Start Date End Date Taking? Authorizing Provider  albuterol (PROVENTIL HFA;VENTOLIN HFA) 108 (90 BASE) MCG/ACT inhaler Inhale 2 puffs into the lungs every 6 (six) hours as needed. wheezing    [provider]  aspirin EC 81  MG tablet Take 81 mg by mouth daily.    [provider]  budesonide (PULMICORT) 0.5 MG/2ML nebulizer solution Take 2 mLs (0.5 mg total) by nebulization 2 (two) times daily for 30 days. 08/07/18 09/06/18  Auburn Bilberry, MD  cyclobenzaprine (FLEXERIL) 5 MG tablet Take 1 tablet (5 mg total) by mouth 3 (three) times daily as needed for muscle spasms. 01/02/18   Menshew, Charlesetta Ivory, PA-C  cycloSPORINE (RESTASIS) 0.05 % ophthalmic emulsion Place 1 drop into both eyes 2 (two) times daily.    [provider]  etodolac (LODINE) 500 MG tablet Take by mouth.    [provider]  guaiFENesin (MUCINEX) 600 MG 12 hr tablet Take by mouth 2 (two) times daily as needed.    [provider]  hydrALAZINE (APRESOLINE) 50 MG tablet Take 50 mg by mouth 2 (two) times daily.    [provider]  ipratropium-albuterol (DUONEB) 0.5-2.5 (3) MG/3ML SOLN Take 3 mLs by nebulization every 6 (six) hours for 3 days. 08/07/18 08/10/18  Auburn Bilberry, MD  lisinopril (PRINIVIL,ZESTRIL) 40 MG tablet Take 40 mg by mouth daily.    [provider]  meloxicam (MOBIC) 7.5 MG tablet Take 1 tablet (7.5 mg total) by mouth daily for 7 days. 11/02/19 11/09/19  Enid Derry, PA-C  metoprolol succinate (TOPROL-XL) 25 MG 24 hr tablet Take 25 mg by mouth 2 (two) times daily.    [provider]  mirtazapine (REMERON)  15 MG tablet Take 15 mg by mouth at bedtime.    [provider]  omeprazole (PRILOSEC) 20 MG capsule Take 20 mg by mouth daily.    [provider]  predniSONE (DELTASONE) 10 MG tablet Take 6 tablets on day 1, take 5 tablets on day 2, take 4 tablets on day 3, take 3 tablets on day 4, take 2 tablets on day 5, take 1 tablet on day 6 11/02/19   Enid Derry, PA-C  simvastatin (ZOCOR) 20 MG tablet Take 20 mg by mouth every evening.    [provider]    Allergies Codeine and Thorazine [chlorpromazine]  History reviewed. No pertinent family  history.  Social History Social History   Tobacco Use  . Smoking status: Current Every Day Smoker    Packs/day: 0.50  . Smokeless tobacco: Never Used  Substance Use Topics  . Alcohol use: Yes    Comment: occasionally per patient  . Drug use: No    Comment: IV drug use history mult. years ago.     Review of Systems  Constitutional: No fever/chills Respiratory: No SOB. Gastrointestinal: No nausea, no vomiting.  Musculoskeletal: Positive for thumb pain. Skin: Negative for rash, abrasions, lacerations, ecchymosis. Neurological: Negative for numbness or tingling   ____________________________________________   PHYSICAL EXAM:  VITAL SIGNS: ED Triage Vitals  Enc Vitals Group     BP 11/02/19 1147 (!) 144/90     Pulse Rate 11/02/19 1147 87     Resp 11/02/19 1147 20     Temp 11/02/19 1147 98.7 F (37.1 C)     Temp Source 11/02/19 1147 Oral     SpO2 11/02/19 1147 100 %     Weight 11/02/19 1145 125 lb (56.7 kg)     Height 11/02/19 1145 5\' 8"  (1.727 m)     Head Circumference --      Peak Flow --      Pain Score 11/02/19 1145 9     Pain Loc --      Pain Edu? --      Excl. in GC? --      Constitutional: Alert and oriented. Well appearing and in no acute distress. Eyes: Conjunctivae are normal. PERRL. EOMI. Head: Atraumatic. ENT:      Ears:      Nose: No congestion/rhinnorhea.      Mouth/Throat: Mucous membranes are moist.  Neck: No stridor.  Cardiovascular: Normal rate, regular rhythm.  Good peripheral circulation. Respiratory: Normal respiratory effort without tachypnea or retractions. Lungs CTAB. Good air entry to the bases with no decreased or absent breath sounds. Musculoskeletal: Full range of motion to all extremities. No gross deformities appreciated.  Limited range of motion of left thumb due to pain.  Patient points to the base of his thumb as area of pain.  No swelling or erythema. Neurologic:  Normal speech and language. No gross focal neurologic deficits  are appreciated.  Skin:  Skin is warm, dry and intact. No rash noted. Psychiatric: Mood and affect are normal. Speech and behavior are normal. Patient exhibits appropriate insight and judgement.   ____________________________________________   LABS (all labs ordered are listed, but only abnormal results are displayed)  Labs Reviewed - No data to display ____________________________________________  EKG   ____________________________________________  RADIOLOGY 11/04/19, personally viewed and evaluated these images (plain radiographs) as part of my medical decision making, as well as reviewing the written report by the radiologist.  DG Hand Complete Left  Result Date: 11/02/2019 CLINICAL DATA:  Proximal thumb pain and swelling. No known injury. Patient denies gout. No redness, fever. EXAM: LEFT HAND - COMPLETE 3+ VIEW COMPARISON:  None. FINDINGS: The mineralization and alignment are normal. There is no evidence of acute fracture or dislocation. There are advanced degenerative changes at the 1st carpometacarpal articulation with joint space narrowing, osteophytes and proximal subluxation. There is no bone destruction. The additional joint spaces are preserved. A metallic foreign body is present within the dorsal soft tissues of the proximal long finger, probably a needle fragment. IMPRESSION: 1. Advanced degenerative changes at the 1st carpometacarpal articulation. 2. Metallic foreign body in the dorsal soft tissues of the proximal long finger, probably a needle fragment. Electronically Signed   By: Richardean Sale M.D.   On: 11/02/2019 13:15    ____________________________________________    PROCEDURES  Procedure(s) performed:    Procedures    Medications  ketorolac (TORADOL) 30 MG/ML injection 30 mg (30 mg Intramuscular Given 11/02/19 1352)  traMADol (ULTRAM) tablet 50 mg (50 mg Oral Given 11/02/19 1352)     ____________________________________________   INITIAL  IMPRESSION / ASSESSMENT AND PLAN / ED COURSE  Pertinent labs & imaging results that were available during my care of the patient were reviewed by me and considered in my medical decision making (see chart for details).  Review of the [place] CSRS was performed in accordance of the [place] prior to dispensing any controlled drugs.     Patient presented to emergency department for evaluation of thumb pain.  I suspect that some pain is inflammatory in nature.  No indication of bacterial infection.  Patient was given IM Toradol and oral tramadol in the emergency department for pain.  Patient will be discharged home with prescriptions for prednisone and Mobic. Patient is to follow up with primary care or hand orthopedics as directed. Patient is given ED precautions to return to the ED for any worsening or new symptoms.  Ian Herman was evaluated in Emergency Department on 11/02/2019 for the symptoms described in the history of present illness. He was evaluated in the context of the global COVID-19 pandemic, which necessitated consideration that the patient might be at risk for infection with the SARS-CoV-2 virus that causes COVID-19. Institutional protocols and algorithms that pertain to the evaluation of patients at risk for COVID-19 are in a state of rapid change based on information released by regulatory bodies including the CDC and federal and state organizations. These policies and algorithms were followed during the patient's care in the ED.   ____________________________________________  FINAL CLINICAL IMPRESSION(S) / ED DIAGNOSES  Final diagnoses:  Pain of left thumb      NEW MEDICATIONS STARTED DURING THIS VISIT:  ED Discharge Orders         Ordered    predniSONE (DELTASONE) 10 MG tablet  Daily,   Status:  Discontinued     11/02/19 1333    meloxicam (MOBIC) 7.5 MG tablet  Daily     11/02/19 1333    predniSONE (DELTASONE) 10 MG tablet     11/02/19 1334              This  chart was dictated using voice recognition software/Dragon. Despite best efforts to proofread, errors can occur which can change the meaning. Any change was purely unintentional.    Laban Emperor, PA-C 11/02/19 1535    Harvest Dark, MD 11/03/19 2131

## 2019-11-02 NOTE — ED Triage Notes (Signed)
Pain to proximal thumb with swelling, no injury. Pt denies gout hx.  No redness, fever

## 2020-12-25 ENCOUNTER — Inpatient Hospital Stay
Admission: EM | Admit: 2020-12-25 | Discharge: 2020-12-29 | DRG: 871 | Disposition: A | Discharge status: Home / Self Care | Payer: No Typology Code available for payment source | Attending: Family Medicine | Admitting: Family Medicine

## 2020-12-25 ENCOUNTER — Emergency Department: Payer: No Typology Code available for payment source

## 2020-12-25 ENCOUNTER — Other Ambulatory Visit: Payer: Self-pay

## 2020-12-25 DIAGNOSIS — I110 Hypertensive heart disease with heart failure: Secondary | ICD-10-CM | POA: Diagnosis present

## 2020-12-25 DIAGNOSIS — Z79899 Other long term (current) drug therapy: Secondary | ICD-10-CM | POA: Diagnosis not present

## 2020-12-25 DIAGNOSIS — F32A Depression, unspecified: Secondary | ICD-10-CM | POA: Diagnosis present

## 2020-12-25 DIAGNOSIS — J9601 Acute respiratory failure with hypoxia: Secondary | ICD-10-CM

## 2020-12-25 DIAGNOSIS — I2510 Atherosclerotic heart disease of native coronary artery without angina pectoris: Secondary | ICD-10-CM | POA: Diagnosis present

## 2020-12-25 DIAGNOSIS — Z7982 Long term (current) use of aspirin: Secondary | ICD-10-CM

## 2020-12-25 DIAGNOSIS — Z888 Allergy status to other drugs, medicaments and biological substances status: Secondary | ICD-10-CM | POA: Diagnosis not present

## 2020-12-25 DIAGNOSIS — J1282 Pneumonia due to coronavirus disease 2019: Secondary | ICD-10-CM | POA: Diagnosis present

## 2020-12-25 DIAGNOSIS — F4310 Post-traumatic stress disorder, unspecified: Secondary | ICD-10-CM | POA: Diagnosis present

## 2020-12-25 DIAGNOSIS — Z720 Tobacco use: Secondary | ICD-10-CM | POA: Diagnosis not present

## 2020-12-25 DIAGNOSIS — E43 Unspecified severe protein-calorie malnutrition: Secondary | ICD-10-CM | POA: Diagnosis present

## 2020-12-25 DIAGNOSIS — A4189 Other specified sepsis: Secondary | ICD-10-CM | POA: Diagnosis present

## 2020-12-25 DIAGNOSIS — A419 Sepsis, unspecified organism: Secondary | ICD-10-CM | POA: Diagnosis present

## 2020-12-25 DIAGNOSIS — R6520 Severe sepsis without septic shock: Secondary | ICD-10-CM | POA: Diagnosis present

## 2020-12-25 DIAGNOSIS — M60272 Foreign body granuloma of soft tissue, not elsewhere classified, left ankle and foot: Secondary | ICD-10-CM | POA: Diagnosis not present

## 2020-12-25 DIAGNOSIS — I10 Essential (primary) hypertension: Secondary | ICD-10-CM | POA: Diagnosis not present

## 2020-12-25 DIAGNOSIS — I5032 Chronic diastolic (congestive) heart failure: Secondary | ICD-10-CM | POA: Diagnosis present

## 2020-12-25 DIAGNOSIS — J440 Chronic obstructive pulmonary disease with acute lower respiratory infection: Secondary | ICD-10-CM | POA: Diagnosis present

## 2020-12-25 DIAGNOSIS — Z7951 Long term (current) use of inhaled steroids: Secondary | ICD-10-CM

## 2020-12-25 DIAGNOSIS — J9621 Acute and chronic respiratory failure with hypoxia: Secondary | ICD-10-CM | POA: Diagnosis present

## 2020-12-25 DIAGNOSIS — K219 Gastro-esophageal reflux disease without esophagitis: Secondary | ICD-10-CM | POA: Diagnosis present

## 2020-12-25 DIAGNOSIS — J13 Pneumonia due to Streptococcus pneumoniae: Secondary | ICD-10-CM | POA: Diagnosis present

## 2020-12-25 DIAGNOSIS — E782 Mixed hyperlipidemia: Secondary | ICD-10-CM | POA: Diagnosis present

## 2020-12-25 DIAGNOSIS — W458XXA Other foreign body or object entering through skin, initial encounter: Secondary | ICD-10-CM | POA: Diagnosis present

## 2020-12-25 DIAGNOSIS — Z885 Allergy status to narcotic agent status: Secondary | ICD-10-CM | POA: Diagnosis not present

## 2020-12-25 DIAGNOSIS — U071 COVID-19: Secondary | ICD-10-CM | POA: Diagnosis present

## 2020-12-25 DIAGNOSIS — E785 Hyperlipidemia, unspecified: Secondary | ICD-10-CM | POA: Diagnosis present

## 2020-12-25 DIAGNOSIS — Z681 Body mass index (BMI) 19 or less, adult: Secondary | ICD-10-CM

## 2020-12-25 DIAGNOSIS — F17210 Nicotine dependence, cigarettes, uncomplicated: Secondary | ICD-10-CM | POA: Diagnosis present

## 2020-12-25 DIAGNOSIS — J189 Pneumonia, unspecified organism: Secondary | ICD-10-CM | POA: Diagnosis not present

## 2020-12-25 DIAGNOSIS — S90852A Superficial foreign body, left foot, initial encounter: Secondary | ICD-10-CM | POA: Diagnosis present

## 2020-12-25 DIAGNOSIS — I252 Old myocardial infarction: Secondary | ICD-10-CM | POA: Diagnosis not present

## 2020-12-25 DIAGNOSIS — R0602 Shortness of breath: Secondary | ICD-10-CM | POA: Diagnosis present

## 2020-12-25 DIAGNOSIS — J441 Chronic obstructive pulmonary disease with (acute) exacerbation: Secondary | ICD-10-CM | POA: Diagnosis present

## 2020-12-25 DIAGNOSIS — R52 Pain, unspecified: Secondary | ICD-10-CM

## 2020-12-25 DIAGNOSIS — Z9861 Coronary angioplasty status: Secondary | ICD-10-CM

## 2020-12-25 DIAGNOSIS — Z9114 Patient's other noncompliance with medication regimen: Secondary | ICD-10-CM

## 2020-12-25 LAB — CBC WITH DIFFERENTIAL/PLATELET
Abs Immature Granulocytes: 0.01 10*3/uL (ref 0.00–0.07)
Basophils Absolute: 0 10*3/uL (ref 0.0–0.1)
Basophils Relative: 0 %
Eosinophils Absolute: 0 10*3/uL (ref 0.0–0.5)
Eosinophils Relative: 0 %
HCT: 42.8 % (ref 39.0–52.0)
Hemoglobin: 14.3 g/dL (ref 13.0–17.0)
Immature Granulocytes: 0 %
Lymphocytes Relative: 8 %
Lymphs Abs: 0.3 10*3/uL — ABNORMAL LOW (ref 0.7–4.0)
MCH: 30.2 pg (ref 26.0–34.0)
MCHC: 33.4 g/dL (ref 30.0–36.0)
MCV: 90.3 fL (ref 80.0–100.0)
Monocytes Absolute: 0.3 10*3/uL (ref 0.1–1.0)
Monocytes Relative: 7 %
Neutro Abs: 3.5 10*3/uL (ref 1.7–7.7)
Neutrophils Relative %: 85 %
Platelets: 126 10*3/uL — ABNORMAL LOW (ref 150–400)
RBC: 4.74 MIL/uL (ref 4.22–5.81)
RDW: 14 % (ref 11.5–15.5)
WBC: 4.1 10*3/uL (ref 4.0–10.5)
nRBC: 0 % (ref 0.0–0.2)

## 2020-12-25 LAB — URINALYSIS, COMPLETE (UACMP) WITH MICROSCOPIC
Bacteria, UA: NONE SEEN
Bilirubin Urine: NEGATIVE
Glucose, UA: NEGATIVE mg/dL
Ketones, ur: NEGATIVE mg/dL
Leukocytes,Ua: NEGATIVE
Nitrite: NEGATIVE
Protein, ur: 30 mg/dL — AB
Specific Gravity, Urine: 1.018 (ref 1.005–1.030)
Squamous Epithelial / HPF: NONE SEEN (ref 0–5)
pH: 6 (ref 5.0–8.0)

## 2020-12-25 LAB — RESP PANEL BY RT-PCR (FLU A&B, COVID) ARPGX2
Influenza A by PCR: NEGATIVE
Influenza B by PCR: NEGATIVE
SARS Coronavirus 2 by RT PCR: POSITIVE — AB

## 2020-12-25 LAB — BLOOD GAS, VENOUS
Acid-Base Excess: 4.1 mmol/L — ABNORMAL HIGH (ref 0.0–2.0)
Bicarbonate: 30.3 mmol/L — ABNORMAL HIGH (ref 20.0–28.0)
O2 Saturation: 34 %
Patient temperature: 37
pCO2, Ven: 50 mmHg (ref 44.0–60.0)
pH, Ven: 7.39 (ref 7.250–7.430)
pO2, Ven: <31 mmHg — CL (ref 32.0–45.0)

## 2020-12-25 LAB — COMPREHENSIVE METABOLIC PANEL
ALT: 13 U/L (ref 0–44)
AST: 18 U/L (ref 15–41)
Albumin: 4.3 g/dL (ref 3.5–5.0)
Alkaline Phosphatase: 72 U/L (ref 38–126)
Anion gap: 10 (ref 5–15)
BUN: 19 mg/dL (ref 8–23)
CO2: 26 mmol/L (ref 22–32)
Calcium: 8.7 mg/dL — ABNORMAL LOW (ref 8.9–10.3)
Chloride: 100 mmol/L (ref 98–111)
Creatinine, Ser: 1.08 mg/dL (ref 0.61–1.24)
GFR, Estimated: >60 mL/min (ref >60)
Glucose, Bld: 147 mg/dL — ABNORMAL HIGH (ref 70–99)
Potassium: 3.6 mmol/L (ref 3.5–5.1)
Sodium: 136 mmol/L (ref 135–145)
Total Bilirubin: 0.7 mg/dL (ref 0.3–1.2)
Total Protein: 7.8 g/dL (ref 6.5–8.1)

## 2020-12-25 LAB — LACTIC ACID, PLASMA
Lactic Acid, Venous: 2.3 mmol/L (ref 0.5–1.9)
Lactic Acid, Venous: 2.6 mmol/L (ref 0.5–1.9)
Lactic Acid, Venous: 2.4 mmol/L (ref 0.5–1.9)
Lactic Acid, Venous: 2.1 mmol/L (ref 0.5–1.9)

## 2020-12-25 LAB — PROTIME-INR
INR: 1.2 (ref 0.8–1.2)
Prothrombin Time: 15.4 seconds — ABNORMAL HIGH (ref 11.4–15.2)

## 2020-12-25 LAB — PROCALCITONIN: Procalcitonin: 0.14 ng/mL

## 2020-12-25 LAB — BRAIN NATRIURETIC PEPTIDE: B Natriuretic Peptide: 90.3 pg/mL (ref 0.0–100.0)

## 2020-12-25 MED ORDER — CARBOXYMETHYLCELLULOSE SODIUM 0.5 % OP SOLN: 1.0000 [drp] | Freq: Three times a day (TID) | OPHTHALMIC | Status: DC | PRN

## 2020-12-25 MED ORDER — SODIUM CHLORIDE 0.9 % IV BOLUS
500.0000 mL | Freq: Once | INTRAVENOUS | Status: AC
  Administered 2020-12-25: 500 mL via INTRAVENOUS

## 2020-12-25 MED ORDER — ENOXAPARIN SODIUM 40 MG/0.4ML IJ SOSY
40.0000 mg | PREFILLED_SYRINGE | INTRAMUSCULAR | Status: DC
  Administered 2020-12-26 – 2020-12-28 (×3): 40 mg via SUBCUTANEOUS
  Filled 2020-12-25 (×4): qty 0.4

## 2020-12-25 MED ORDER — SODIUM CHLORIDE 0.9 % IV SOLN: INTRAVENOUS | Status: DC

## 2020-12-25 MED ORDER — SODIUM CHLORIDE 0.9 % IV SOLN
500.0000 mg | INTRAVENOUS | Status: DC
  Filled 2020-12-25: qty 500

## 2020-12-25 MED ORDER — LISINOPRIL 20 MG PO TABS
40.0000 mg | ORAL_TABLET | Freq: Every day | ORAL | Status: DC
  Administered 2020-12-25: 40 mg via ORAL
  Filled 2020-12-25: qty 4

## 2020-12-25 MED ORDER — IPRATROPIUM-ALBUTEROL 0.5-2.5 (3) MG/3ML IN SOLN
3.0000 mL | Freq: Once | RESPIRATORY_TRACT | Status: AC
  Administered 2020-12-25: 3 mL via RESPIRATORY_TRACT
  Filled 2020-12-25: qty 3

## 2020-12-25 MED ORDER — SODIUM CHLORIDE 0.9 % IV SOLN
1.0000 g | INTRAVENOUS | Status: DC
  Administered 2020-12-26 – 2020-12-28 (×3): 1 g via INTRAVENOUS
  Filled 2020-12-25 (×2): qty 1
  Filled 2020-12-25 (×2): qty 10

## 2020-12-25 MED ORDER — SIMVASTATIN 20 MG PO TABS
20.0000 mg | ORAL_TABLET | Freq: Every evening | ORAL | Status: DC
  Administered 2020-12-25 – 2020-12-28 (×4): 20 mg via ORAL
  Filled 2020-12-25 (×3): qty 1
  Filled 2020-12-25: qty 2
  Filled 2020-12-25: qty 1

## 2020-12-25 MED ORDER — SODIUM CHLORIDE 0.9 % IV SOLN
200.0000 mg | Freq: Once | INTRAVENOUS | Status: AC
  Administered 2020-12-25: 200 mg via INTRAVENOUS
  Filled 2020-12-25: qty 40

## 2020-12-25 MED ORDER — DM-GUAIFENESIN ER 30-600 MG PO TB12: 1.0000 | ORAL_TABLET | Freq: Two times a day (BID) | ORAL | Status: DC | PRN

## 2020-12-25 MED ORDER — ENSURE ENLIVE PO LIQD
237.0000 mL | Freq: Two times a day (BID) | ORAL | Status: DC
  Administered 2020-12-26 – 2020-12-27 (×3): 237 mL via ORAL

## 2020-12-25 MED ORDER — POLYVINYL ALCOHOL 1.4 % OP SOLN
2.0000 [drp] | OPHTHALMIC | Status: DC | PRN
  Filled 2020-12-25: qty 15

## 2020-12-25 MED ORDER — METHYLPREDNISOLONE SODIUM SUCC 40 MG IJ SOLR
40.0000 mg | Freq: Two times a day (BID) | INTRAMUSCULAR | Status: DC
  Administered 2020-12-26 – 2020-12-29 (×8): 40 mg via INTRAVENOUS
  Filled 2020-12-25 (×8): qty 1

## 2020-12-25 MED ORDER — ONDANSETRON HCL 4 MG/2ML IJ SOLN: 4.0000 mg | Freq: Three times a day (TID) | INTRAMUSCULAR | Status: DC | PRN

## 2020-12-25 MED ORDER — ACETAMINOPHEN 325 MG PO TABS
650.0000 mg | ORAL_TABLET | Freq: Four times a day (QID) | ORAL | Status: DC | PRN
  Administered 2020-12-25: 650 mg via ORAL
  Filled 2020-12-25 (×2): qty 2

## 2020-12-25 MED ORDER — MOMETASONE FURO-FORMOTEROL FUM 200-5 MCG/ACT IN AERO
2.0000 | INHALATION_SPRAY | Freq: Two times a day (BID) | RESPIRATORY_TRACT | Status: DC
  Administered 2020-12-25 – 2020-12-29 (×7): 2 via RESPIRATORY_TRACT
  Filled 2020-12-25 (×2): qty 8.8

## 2020-12-25 MED ORDER — METOPROLOL TARTRATE 50 MG PO TABS
50.0000 mg | ORAL_TABLET | Freq: Two times a day (BID) | ORAL | Status: DC
  Administered 2020-12-25: 50 mg via ORAL
  Filled 2020-12-25 (×2): qty 1

## 2020-12-25 MED ORDER — METHYLPREDNISOLONE SODIUM SUCC 125 MG IJ SOLR
125.0000 mg | Freq: Once | INTRAMUSCULAR | Status: AC
  Administered 2020-12-25: 125 mg via INTRAVENOUS
  Filled 2020-12-25: qty 2

## 2020-12-25 MED ORDER — SODIUM CHLORIDE 0.9 % IV SOLN
1.0000 g | Freq: Once | INTRAVENOUS | Status: AC
  Administered 2020-12-25: 1 g via INTRAVENOUS
  Filled 2020-12-25: qty 10

## 2020-12-25 MED ORDER — SODIUM CHLORIDE 0.9 % IV SOLN
500.0000 mg | Freq: Once | INTRAVENOUS | Status: AC
  Administered 2020-12-25: 500 mg via INTRAVENOUS
  Filled 2020-12-25: qty 500

## 2020-12-25 MED ORDER — HYDRALAZINE HCL 20 MG/ML IJ SOLN
5.0000 mg | INTRAMUSCULAR | Status: DC | PRN
  Administered 2020-12-26 – 2020-12-28 (×2): 5 mg via INTRAVENOUS
  Filled 2020-12-25 (×2): qty 1

## 2020-12-25 MED ORDER — SODIUM CHLORIDE 0.9 % IV SOLN
100.0000 mg | Freq: Every day | INTRAVENOUS | Status: AC
  Administered 2020-12-26 – 2020-12-29 (×4): 100 mg via INTRAVENOUS
  Filled 2020-12-25 (×2): qty 100
  Filled 2020-12-25: qty 20
  Filled 2020-12-25: qty 100

## 2020-12-25 MED ORDER — IPRATROPIUM BROMIDE HFA 17 MCG/ACT IN AERS
2.0000 | INHALATION_SPRAY | RESPIRATORY_TRACT | Status: DC
  Administered 2020-12-25 (×3): 2 via RESPIRATORY_TRACT
  Filled 2020-12-25: qty 12.9

## 2020-12-25 MED ORDER — NICOTINE 21 MG/24HR TD PT24
21.0000 mg | MEDICATED_PATCH | Freq: Every day | TRANSDERMAL | Status: DC
  Administered 2020-12-26 – 2020-12-29 (×4): 21 mg via TRANSDERMAL
  Filled 2020-12-25 (×5): qty 1

## 2020-12-25 MED ORDER — ASPIRIN EC 81 MG PO TBEC
81.0000 mg | DELAYED_RELEASE_TABLET | Freq: Every day | ORAL | Status: DC
  Administered 2020-12-26 – 2020-12-29 (×4): 81 mg via ORAL
  Filled 2020-12-25 (×4): qty 1

## 2020-12-25 MED ORDER — ALBUTEROL SULFATE HFA 108 (90 BASE) MCG/ACT IN AERS
2.0000 | INHALATION_SPRAY | RESPIRATORY_TRACT | Status: DC | PRN
  Filled 2020-12-25: qty 6.7

## 2020-12-25 MED ORDER — PANTOPRAZOLE SODIUM 40 MG PO TBEC: 40.0000 mg | DELAYED_RELEASE_TABLET | Freq: Every day | ORAL | Status: DC | PRN

## 2020-12-25 MED ORDER — ALBUTEROL SULFATE HFA 108 (90 BASE) MCG/ACT IN AERS
2.0000 | INHALATION_SPRAY | RESPIRATORY_TRACT | Status: DC | PRN
  Administered 2020-12-26 (×2): 2 via RESPIRATORY_TRACT
  Filled 2020-12-25 (×2): qty 6.7

## 2020-12-25 NOTE — ED Triage Notes (Signed)
Patient brought in via ems from home. Patient lives at home alone and states he has been feeling sick for a few days. Increased SOB today. Per EMS patient was in SVT but converted on own. 1 duoneb given. Denies other symptoms. No distress at this time. VSS

## 2020-12-25 NOTE — ED Notes (Signed)
Dr. Frederick Peers notified of positive covid result.

## 2020-12-25 NOTE — ED Provider Notes (Signed)
[facility] Emergency Department Provider Note   ____________________________________________    I have reviewed the triage vital signs and the nursing notes.   HISTORY  Chief Complaint Shortness of Breath     HPI Ian Herman is a 75 y.o. male with history of CHF, COPD, CAD who presents with complaints of shortness of breath.  He is not on home oxygen reportedly.  He reports that breathing difficulty started over the last 2 days, worsened last night.  He does report cough, some chills, does not think he has had a fever.  No chest pain.  Received 1 DuoNeb via EMS.  Past Medical History:  Diagnosis Date   Arthritis    Bronchitis    CHF (congestive heart failure) (HCC)    COPD (chronic obstructive pulmonary disease) (HCC)    Coronary artery disease    Depression    GERD (gastroesophageal reflux disease)    Hepatitis C    Hypertension    Myocardial infarction acute (HCC)    Pleural effusion    PTSD (post-traumatic stress disorder)    Shortness of breath dyspnea     Patient Active Problem List   Diagnosis Date Noted   COPD exacerbation (HCC)    Anxiety state    Advanced care planning/counseling discussion    Goals of care, counseling/discussion    Palliative care by specialist    Acute on chronic respiratory failure with hypoxia and hypercapnia (HCC) 08/02/2018   Protein-calorie malnutrition, severe 08/02/2018    Past Surgical History:  Procedure Laterality Date   APPENDECTOMY     CORONARY ANGIOPLASTY     stent   INGUINAL HERNIA REPAIR Left 04/23/2015   Procedure: HERNIA REPAIR INGUINAL ADULT;  Surgeon: Nadeen Landau, MD;  Location: [facility] ORS;  Service: General;  Laterality: Left;    Prior to Admission medications   Medication Sig Start Date End Date Taking? Authorizing Provider  albuterol (PROVENTIL HFA;VENTOLIN HFA) 108 (90 BASE) MCG/ACT inhaler Inhale 2 puffs into the lungs every 6 (six) hours as needed. wheezing     [provider]  aspirin EC 81 MG tablet Take 81 mg by mouth daily.    [provider]  budesonide (PULMICORT) 0.5 MG/2ML nebulizer solution Take 2 mLs (0.5 mg total) by nebulization 2 (two) times daily for 30 days. 08/07/18 09/06/18  Auburn Bilberry, MD  cyclobenzaprine (FLEXERIL) 5 MG tablet Take 1 tablet (5 mg total) by mouth 3 (three) times daily as needed for muscle spasms. 01/02/18   Menshew, Charlesetta Ivory, PA-C  cycloSPORINE (RESTASIS) 0.05 % ophthalmic emulsion Place 1 drop into both eyes 2 (two) times daily.    [provider]  etodolac (LODINE) 500 MG tablet Take by mouth.    [provider]  guaiFENesin (MUCINEX) 600 MG 12 hr tablet Take by mouth 2 (two) times daily as needed.    [provider]  hydrALAZINE (APRESOLINE) 50 MG tablet Take 50 mg by mouth 2 (two) times daily.    [provider]  ipratropium-albuterol (DUONEB) 0.5-2.5 (3) MG/3ML SOLN Take 3 mLs by nebulization every 6 (six) hours for 3 days. 08/07/18 08/10/18  Auburn Bilberry, MD  lisinopril (PRINIVIL,ZESTRIL) 40 MG tablet Take 40 mg by mouth daily.    [provider]  metoprolol succinate (TOPROL-XL) 25 MG 24 hr tablet Take 25 mg by mouth 2 (two) times daily.    [provider]  mirtazapine (REMERON) 15 MG tablet Take 15 mg by mouth at bedtime.  [provider]  omeprazole (PRILOSEC) 20 MG capsule Take 20 mg by mouth daily.    [provider]  predniSONE (DELTASONE) 10 MG tablet Take 6 tablets on day 1, take 5 tablets on day 2, take 4 tablets on day 3, take 3 tablets on day 4, take 2 tablets on day 5, take 1 tablet on day 6 11/02/19   Enid Derry, PA-C  simvastatin (ZOCOR) 20 MG tablet Take 20 mg by mouth every evening.    [provider]     Allergies Codeine and Thorazine [chlorpromazine]  History reviewed. No pertinent family history.  Social History Social History   Tobacco Use   Smoking status: Every Day     Packs/day: 0.50    Types: Cigarettes   Smokeless tobacco: Never  Substance Use Topics   Alcohol use: Yes    Comment: occasionally per patient   Drug use: No    Comment: IV drug use history mult. years ago.    Review of Systems  Constitutional: No fever/chills Eyes: No visual changes.  ENT: No sore throat. Cardiovascular: Denies chest pain. Respiratory: As above Gastrointestinal: No abdominal pain.  No nausea, no vomiting.   Genitourinary: Negative for dysuria. Musculoskeletal: No calf pain or swelling Skin: Negative for rash. Neurological: Negative for headaches or weakness   ____________________________________________   PHYSICAL EXAM:  VITAL SIGNS: ED Triage Vitals  Enc Vitals Group     BP 12/25/20 1117 (!) 177/99     Pulse Rate 12/25/20 1117 100     Resp 12/25/20 1117 (!) 26     Temp 12/25/20 1117 99.9 F (37.7 C)     Temp Source 12/25/20 1117 Oral     SpO2 12/25/20 1117 91 %     Weight 12/25/20 1118 47.2 kg (104 lb)     Height 12/25/20 1118 1.727 m (5\' 8" )     Head Circumference --      Peak Flow --      Pain Score 12/25/20 1118 0     Pain Loc --      Pain Edu? --      Excl. in GC? --     Constitutional: Alert and oriented.  Eyes: Conjunctivae are normal.  Head: Atraumatic. Nose: No congestion/rhinnorhea. Mouth/Throat: Mucous membranes are moist.   Neck:  Painless ROM Cardiovascular: Normal rate, regular rhythm.  Respiratory: Increased respiratory effort with mild tachypnea, scattered wheezes Gastrointestinal: Soft and nontender. No distention. Genitourinary: deferred Musculoskeletal: No lower extremity tenderness nor edema.  Warm and well perfused Neurologic:  Normal speech and language. No gross focal neurologic deficits are appreciated.  Skin:  Skin is warm, dry and intact. No rash noted. Psychiatric: Mood and affect are normal. Speech and behavior are normal.  ____________________________________________   LABS (all labs ordered are listed,  but only abnormal results are displayed)  Labs Reviewed  COMPREHENSIVE METABOLIC PANEL - Abnormal; Notable for the following components:      Result Value   Glucose, Bld 147 (*)    Calcium 8.7 (*)    All other components within normal limits  LACTIC ACID, PLASMA - Abnormal; Notable for the following components:   Lactic Acid, Venous 2.3 (*)    All other components within normal limits  CBC WITH DIFFERENTIAL/PLATELET - Abnormal; Notable for the following components:   Platelets 126 (*)    Lymphs Abs 0.3 (*)    All other components within normal limits  PROTIME-INR - Abnormal; Notable for the following components:   Prothrombin Time  15.4 (*)    All other components within normal limits  BLOOD GAS, VENOUS - Abnormal; Notable for the following components:   pO2, Ven <31.0 (*)    Bicarbonate 30.3 (*)    Acid-Base Excess 4.1 (*)    All other components within normal limits  CULTURE, BLOOD (ROUTINE X 2)  CULTURE, BLOOD (ROUTINE X 2)  RESP PANEL BY RT-PCR (FLU A&B, COVID) ARPGX2  LACTIC ACID, PLASMA  URINALYSIS, COMPLETE (UACMP) WITH MICROSCOPIC   ____________________________________________  EKG  ED ECG REPORT I, Jene Every, the attending physician, personally viewed and interpreted this ECG.  Date: 12/25/2020  Rhythm: normal sinus rhythm QRS Axis: normal Intervals: Nonspecific change ST/T Wave abnormalities: normal Narrative Interpretation: no evidence of acute ischemia  ____________________________________________  RADIOLOGY  Chest x-ray viewed by me, questionable left lower lobe pneumonia ____________________________________________   PROCEDURES  Procedure(s) performed: No  Procedures   Critical Care performed: yes  CRITICAL CARE Performed by: Jene Every   Total critical care time: 30 minutes  Critical care time was exclusive of separately billable procedures and treating other patients.  Critical care was necessary to treat or prevent imminent  or life-threatening deterioration.  Critical care was time spent personally by me on the following activities: development of treatment plan with patient and/or surrogate as well as nursing, discussions with consultants, evaluation of patient's response to treatment, examination of patient, obtaining history from patient or surrogate, ordering and performing treatments and interventions, ordering and review of laboratory studies, ordering and review of radiographic studies, pulse oximetry and re-evaluation of patient's condition.  ____________________________________________   INITIAL IMPRESSION / ASSESSMENT AND PLAN / ED COURSE  Pertinent labs & imaging results that were available during my care of the patient were reviewed by me and considered in my medical decision making (see chart for details).   Patient presents with shortness of breath as detailed above, history of CHF, COPD, does describe mild chills temperature noted to be 99.9 and 89% on room air.  Will obtain labs, chest x-ray placed on nasal cannula oxygen, given duo nebs and Solu-Medrol for likely COPD exacerbation  Chest x-ray demonstrates likely left lower lobe pneumonia, elevated lactic acid as well.  We will treat with IV Rocephin and azithromycin.  Will discuss with the hospitalist for admission for respiratory failure    ____________________________________________   FINAL CLINICAL IMPRESSION(S) / ED DIAGNOSES  Final diagnoses:  Community acquired pneumonia of left lower lobe of lung  Acute respiratory failure with hypoxia (HCC)        Note:  This document was prepared using Dragon voice recognition software and may include unintentional dictation errors.    Jene Every, MD 12/25/20 [phone]

## 2020-12-25 NOTE — H&P (Addendum)
History and Physical    Ian Herman ZOX:[id number] DOB: 18-Jan-1946 DOA: 12/25/2020  Referring MD/NP/PA:   PCP: Louanne SkyeMiranda, Harvey, MD   Patient coming from:  The patient is coming from home.  At baseline, pt is independent for most of ADL.        Chief Complaint: SOB  HPI: Ian Herman is a 75 y.o. male with medical history significant of hypertension, hyperlipidemia, COPD, GERD, depression with anxiety, PTSD, CAD, HCV, dCHF, pleural effusion, tobacco abuse, who presents with shortness breath.  Patient states that he has been having shortness of breath for almost 3 days, which has been progressively worsening.  Patient has cough with yellow sputum production.  Patient has mild front chest pain. Patient has chills, no fever.  No nausea, vomiting, diarrhea or abdominal pain.  No symptoms of UTI. Per report, patient had SVT which spontaneously converted to sinus rhythm. Patient was found to have oxygen desaturation to 89% on room air. Patient is not using oxygen at home.  ED Course: pt was found to have WBC 4.1, pending COVID-19 PCR, lactic acid 2.3, INR 1.2, electrolytes renal function okay, temperature 99.9, blood pressure 171/87, heart rate 100, 96, RR 31.  Chest x-ray showed COPD and patchy opacity in left lower lobe.  ABG with pH 7.39, CO2 50, O2 <31. Patient is admitted to MedSurg bed as inpatient  Review of Systems:   General: no fevers, has chills, no body weight gain, has fatigue HEENT: no blurry vision, hearing changes or sore throat Respiratory: has dyspnea, coughing, no wheezing CV: has chest pain, no palpitations GI: no nausea, vomiting, abdominal pain, diarrhea, constipation GU: no dysuria, burning on urination, increased urinary frequency, hematuria  Ext: no leg edema Neuro: no unilateral weakness, numbness, or tingling, no vision change or hearing loss Skin: no rash, no skin tear. MSK: No muscle spasm, no deformity, no limitation of range of movement in spin Heme:  No easy bruising.  Travel history: No recent long distant travel.  Allergy:  Allergies  Allergen Reactions   Codeine Itching    itching   Thorazine [Chlorpromazine] Hives    Hives     Past Medical History:  Diagnosis Date   Arthritis    Bronchitis    CHF (congestive heart failure) (HCC)    COPD (chronic obstructive pulmonary disease) (HCC)    Coronary artery disease    Depression    GERD (gastroesophageal reflux disease)    Hepatitis C    Hypertension    Myocardial infarction acute (HCC)    Pleural effusion    PTSD (post-traumatic stress disorder)    Shortness of breath dyspnea     Past Surgical History:  Procedure Laterality Date   APPENDECTOMY     CORONARY ANGIOPLASTY     stent   INGUINAL HERNIA REPAIR Left 04/23/2015   Procedure: HERNIA REPAIR INGUINAL ADULT;  Surgeon: Nadeen LandauJarvis Wilton Smith, MD;  Location: [facility] ORS;  Service: General;  Laterality: Left;    Social History:  reports that he has been smoking. He has been smoking an average of .5 packs per day. He has never used smokeless tobacco. He reports current alcohol use. He reports that he does not use drugs.  Family History: History reviewed. No pertinent family history.  I have tried to review with patient about family history, but patient cannot provide detailed information about family medical history.   Prior to Admission medications   Medication Sig Start Date End Date Taking? Authorizing Provider  albuterol (PROVENTIL HFA;VENTOLIN  HFA) 108 (90 BASE) MCG/ACT inhaler Inhale 2 puffs into the lungs every 6 (six) hours as needed. wheezing    [provider]  aspirin EC 81 MG tablet Take 81 mg by mouth daily.    [provider]  budesonide (PULMICORT) 0.5 MG/2ML nebulizer solution Take 2 mLs (0.5 mg total) by nebulization 2 (two) times daily for 30 days. 08/07/18 09/06/18  Auburn Bilberry, MD  cyclobenzaprine (FLEXERIL) 5 MG tablet Take 1 tablet (5 mg total) by mouth 3 (three) times daily as  needed for muscle spasms. 01/02/18   Menshew, Charlesetta Ivory, PA-C  cycloSPORINE (RESTASIS) 0.05 % ophthalmic emulsion Place 1 drop into both eyes 2 (two) times daily.    [provider]  etodolac (LODINE) 500 MG tablet Take by mouth.    [provider]  guaiFENesin (MUCINEX) 600 MG 12 hr tablet Take by mouth 2 (two) times daily as needed.    [provider]  hydrALAZINE (APRESOLINE) 50 MG tablet Take 50 mg by mouth 2 (two) times daily.    [provider]  ipratropium-albuterol (DUONEB) 0.5-2.5 (3) MG/3ML SOLN Take 3 mLs by nebulization every 6 (six) hours for 3 days. 08/07/18 08/10/18  Auburn Bilberry, MD  lisinopril (PRINIVIL,ZESTRIL) 40 MG tablet Take 40 mg by mouth daily.    [provider]  metoprolol succinate (TOPROL-XL) 25 MG 24 hr tablet Take 25 mg by mouth 2 (two) times daily.    [provider]  mirtazapine (REMERON) 15 MG tablet Take 15 mg by mouth at bedtime.    [provider]  omeprazole (PRILOSEC) 20 MG capsule Take 20 mg by mouth daily.    [provider]  predniSONE (DELTASONE) 10 MG tablet Take 6 tablets on day 1, take 5 tablets on day 2, take 4 tablets on day 3, take 3 tablets on day 4, take 2 tablets on day 5, take 1 tablet on day 6 11/02/19   Enid Derry, PA-C  simvastatin (ZOCOR) 20 MG tablet Take 20 mg by mouth every evening.    [provider]    Physical Exam: Vitals:   12/25/20 1117 12/25/20 1118 12/25/20 1145 12/25/20 1200  BP: (!) 177/99  (!) 170/89 (!) 171/87  Pulse: 100  91 96  Resp: (!) 26  (!) 24 (!) 31  Temp: 99.9 F (37.7 C)     TempSrc: Oral     SpO2: 91%  91% (!) 89%  Weight:  47.2 kg    Height:  5\' 8"  (1.727 m)     General: Not in acute distress.  Thin body habitus, cachectic looking. HEENT:       Eyes: PERRL, EOMI, no scleral icterus.       ENT: No discharge from the ears and nose, no pharynx injection, no tonsillar enlargement.        Neck: No JVD, no bruit, no mass  felt. Heme: No neck lymph node enlargement. Cardiac: S1/S2, RRR, No murmurs, No gallops or rubs. Respiratory: Has rhonchi bilaterally. GI: Soft, nondistended, nontender, no rebound pain, no organomegaly, BS present. GU: No hematuria Ext: No pitting leg edema bilaterally. 1+DP/PT pulse bilaterally. Musculoskeletal: No joint deformities, No joint redness or warmth, no limitation of ROM in spin. Skin: No rashes.  Neuro: Alert, oriented X3, cranial nerves II-XII grossly intact, moves all extremities normally.  Psych: Patient is not psychotic, no suicidal or hemocidal ideation.  Labs on Admission: I have personally reviewed following labs and imaging studies  CBC: Recent Labs  Lab  12/25/20 1125  WBC 4.1  NEUTROABS 3.5  HGB 14.3  HCT 42.8  MCV 90.3  PLT 126*   Basic Metabolic Panel: Recent Labs  Lab 12/25/20 1125  NA 136  K 3.6  CL 100  CO2 26  GLUCOSE 147*  BUN 19  CREATININE 1.08  CALCIUM 8.7*   GFR: Estimated Creatinine Clearance: 40.7 mL/min (by C-G formula based on SCr of 1.08 mg/dL). Liver Function Tests: Recent Labs  Lab 12/25/20 1125  AST 18  ALT 13  ALKPHOS 72  BILITOT 0.7  PROT 7.8  ALBUMIN 4.3   No results for input(s): LIPASE, AMYLASE in the last 168 hours. No results for input(s): AMMONIA in the last 168 hours. Coagulation Profile: Recent Labs  Lab 12/25/20 1125  INR 1.2   Cardiac Enzymes: No results for input(s): CKTOTAL, CKMB, CKMBINDEX, TROPONINI in the last 168 hours. BNP (last 3 results) No results for input(s): PROBNP in the last 8760 hours. HbA1C: No results for input(s): HGBA1C in the last 72 hours. CBG: No results for input(s): GLUCAP in the last 168 hours. Lipid Profile: No results for input(s): CHOL, HDL, LDLCALC, TRIG, CHOLHDL, LDLDIRECT in the last 72 hours. Thyroid Function Tests: No results for input(s): TSH, T4TOTAL, FREET4, T3FREE, THYROIDAB in the last 72 hours. Anemia Panel: No results for input(s): VITAMINB12, FOLATE,  FERRITIN, TIBC, IRON, RETICCTPCT in the last 72 hours. Urine analysis:    Component Value Date/Time   COLORURINE YELLOW (A) 12/25/2020 1215   APPEARANCEUR CLEAR (A) 12/25/2020 1215   APPEARANCEUR Clear 03/15/2013 1642   LABSPEC 1.018 12/25/2020 1215   LABSPEC 1.016 03/15/2013 1642   PHURINE 6.0 12/25/2020 1215   GLUCOSEU NEGATIVE 12/25/2020 1215   GLUCOSEU Negative 03/15/2013 1642   HGBUR MODERATE (A) 12/25/2020 1215   BILIRUBINUR NEGATIVE 12/25/2020 1215   BILIRUBINUR Negative 03/15/2013 1642   KETONESUR NEGATIVE 12/25/2020 1215   PROTEINUR 30 (A) 12/25/2020 1215   NITRITE NEGATIVE 12/25/2020 1215   LEUKOCYTESUR NEGATIVE 12/25/2020 1215   LEUKOCYTESUR 1+ 03/15/2013 1642   Sepsis Labs: @LABRCNTIP (procalcitonin:4,lacticidven:4) )No results found for this or any previous visit (from the past 240 hour(s)).   Radiological Exams on Admission: DG Chest 2 View  Result Date: 12/25/2020 CLINICAL DATA:  Shortness of breath, increased today. EXAM: CHEST - 2 VIEW COMPARISON:  08/02/2018 FINDINGS: Normal sized heart. Interval patchy opacity in the inferior left lower lobe. The remainder of the lungs are clear. The lungs remain hyperexpanded with mild diffuse bullous changes. A right skin fold is again demonstrated. Tortuous and partially calcified thoracic aorta. Unremarkable bones. IMPRESSION: 1. Mild patchy atelectasis or pneumonia in the inferior left lower lobe. 2. Stable changes of COPD. Electronically Signed   By: 08/04/2018 M.D.   On: 12/25/2020 11:58     EKG: I have personally reviewed.  Sinus rhythm, QTC 478, LAD, ST depression in V4-V6   Assessment/Plan Principal Problem:   Acute on chronic respiratory failure with hypoxia (HCC) Active Problems:   Protein-calorie malnutrition, severe   COPD exacerbation (HCC)   Coronary artery disease   Depression   Hypertension   HLD (hyperlipidemia)   CAP (community acquired pneumonia)   Severe sepsis (HCC)   Tobacco abuse   Chronic  diastolic CHF (congestive heart failure) (HCC)   Acute on chronic respiratory failure with hypoxia and sepsis due to COPD exacerbation and CAP: CXR showed patchy infiltrates in left lower lobe.  Patient meets criteria for severe sepsis with tachycardia with heart rate 100 and tachypnea with RR 21.  Lactic acid is elevated 2.3.  Currently hemodynamically stable.  - will admit to med-surg bed as inpatient -Bronchodilators -Solu-Medrol 40 mg IV bid -Rocephin and azithromycin IV -Mucinex for cough  -Incentive spirometry -Urine S. pneumococcal and antigen -Follow up blood culture x2, sputum culture -Nasal cannula oxygen as needed to maintain O2 saturation 93% or greater -will get Procalcitonin and trend lactic acid levels per sepsis protocol. -IVF: 500 x 2 ml of NS bolus in ED, followed by 75 cc/h (patient has a congestive heart failure, limiting aggressive IV fluids treatment).  Protein-calorie malnutrition, severe: BMI 15.81 -start Ensure -Nutrition consult  Coronary artery disease: -ASA and Zocor  Depression: Pt is not taking his home Remeron -observe now  Hypertension: Patient is not taking his home medications including hydralazine, lisinopril and metoprolol.  His blood pressure is elevated at 171/87 -restart lisinopril, metoprolol -IV hydralazine as needed  HLD (hyperlipidemia) - Zocor  Tobacco abuse -Nicotine patch  Chronic diastolic CHF (congestive heart failure) (HCC): 2D echo on 04/22/2015 showed EF of 55% with grade 1 diastolic dysfunction. -Check BNP -->70    DVT ppx: SQ Lovenox Code Status: Full code (I discussed with patient about CODE STATUS, he wants to be full code). Family Communication: yes, I called his friend by phone Disposition Plan:  Anticipate discharge back to previous environment Consults called:  none Admission status and Level of care: Med-Surg:  as inpt     Status is: Inpatient  Remains inpatient appropriate because:Inpatient level of care  appropriate due to severity of illness  Dispo: The patient is from: Home              Anticipated d/c is to: Home              Patient currently is not medically stable to d/c.   Difficult to place patient No           Date of Service 12/25/2020    Lorretta Harp [facility]   If 7PM-7AM, please contact night-coverage [email or web address] 12/25/2020, 2:27 PM

## 2020-12-25 NOTE — Consult Note (Signed)
Remdesivir - Pharmacy Brief Note   O:  ALT: WNL CXR: Mild patchy atelectasis or pneumonia in the inferior left lower lobe. SpO2: 92% on 2L    A/P:  Remdesivir 200 mg IVPB once followed by 100 mg IVPB daily x 4 days.  Recommend discontinuation of antibiotics for CAP   Sharen Hones, PharmD, BCPS Clinical Pharmacist   12/25/2020 8:37 PM

## 2020-12-26 LAB — CBC
HCT: 36 % — ABNORMAL LOW (ref 39.0–52.0)
Hemoglobin: 12.1 g/dL — ABNORMAL LOW (ref 13.0–17.0)
MCH: 30 pg (ref 26.0–34.0)
MCHC: 33.6 g/dL (ref 30.0–36.0)
MCV: 89.3 fL (ref 80.0–100.0)
Platelets: 105 10*3/uL — ABNORMAL LOW (ref 150–400)
RBC: 4.03 MIL/uL — ABNORMAL LOW (ref 4.22–5.81)
RDW: 13.8 % (ref 11.5–15.5)
WBC: 7.3 10*3/uL (ref 4.0–10.5)
nRBC: 0 % (ref 0.0–0.2)

## 2020-12-26 LAB — STREP PNEUMONIAE URINARY ANTIGEN: Strep Pneumo Urinary Antigen: POSITIVE — AB

## 2020-12-26 MED ORDER — ORAL CARE MOUTH RINSE
15.0000 mL | Freq: Two times a day (BID) | OROMUCOSAL | Status: DC
  Administered 2020-12-26 – 2020-12-29 (×7): 15 mL via OROMUCOSAL

## 2020-12-26 MED ORDER — IPRATROPIUM-ALBUTEROL 20-100 MCG/ACT IN AERS
1.0000 | INHALATION_SPRAY | Freq: Four times a day (QID) | RESPIRATORY_TRACT | Status: DC
  Administered 2020-12-26 – 2020-12-29 (×13): 1 via RESPIRATORY_TRACT
  Filled 2020-12-26: qty 4

## 2020-12-26 NOTE — Plan of Care (Signed)
  Problem: Clinical Measurements: Goal: Diagnostic test results will improve Outcome: Progressing Goal: Respiratory complications will improve Outcome: Progressing Goal: Cardiovascular complication will be avoided Outcome: Progressing   Problem: Coping: Goal: Level of anxiety will decrease Outcome: Progressing   Problem: Safety: Goal: Ability to remain free from injury will improve Outcome: Progressing

## 2020-12-26 NOTE — Progress Notes (Signed)
[facility] Progress Note  Patient: Ian Herman    QJF:[id number]  DOA: 12/25/2020     Date of Service: the patient was seen and examined on 12/26/2020  Brief hospital course: Past medical history of COPD, GERD, HTN, PTSD, CAD, CHF, smoker, depression.  Presents with complaints of shortness of breath and anxiety.  Found to have COVID as well as strep pneumo pneumonia. Currently plan is continue IV therapy.  Subjective: Continues to have shortness of breath.  Requesting his nebulizer.  No nausea no vomiting.  No chest pain.  No fever No choking episode.  Assessment and Plan: 1.  Acute on chronic respiratory failure with hypoxia COPD exacerbation Strep pneumo community-acquired pneumonia. COVID-19 pneumonia. Chest x-ray shows infiltrate.  On presentation tachycardic and tachypneic meeting SIRS criteria. COVID-19 test positive as well. Strep pneumo urine antigen also positive. Blood cultures so far negative. Procalcitonin mildly elevated. Will treat with IV Solu-Medrol, continue with IV antibiotics. Patient was started on IV remdesivir and steroids which we will continue for COVID-19 infection as well. Vitamin C as well.  Monitor.  Currently holding off on initiation of baricitinib or similar medication secondary to bacterial infection.  2.  Severe protein calorie malnutrition Dietitian consulted.  3.  CAD, HLD Continue aspirin.  Continue statin.  4.  HTN Blood pressure stable. Will monitor.  Holding lisinopril.  5.  Active smoker Nicotine patch.   Scheduled Meds:  aspirin EC  81 mg Oral Daily   enoxaparin (LOVENOX) injection  40 mg Subcutaneous Q24H   feeding supplement  237 mL Oral BID BM   Ipratropium-Albuterol  1 puff Inhalation QID   mouth rinse  15 mL Mouth Rinse BID   methylPREDNISolone (SOLU-MEDROL) injection  40 mg Intravenous Q12H   mometasone-formoterol  2 puff Inhalation BID   nicotine  21 mg Transdermal Daily   simvastatin  20 mg Oral QPM    Continuous Infusions:  cefTRIAXone (ROCEPHIN)  IV Stopped (12/26/20 1327)   remdesivir 100 mg in NS 100 mL 100 mg (12/26/20 1110)   PRN Meds: acetaminophen, albuterol, dextromethorphan-guaiFENesin, hydrALAZINE, ondansetron (ZOFRAN) IV, polyvinyl alcohol  Body mass index is 15.81 kg/m.        DVT Prophylaxis:   enoxaparin (LOVENOX) injection 40 mg Start: 12/25/20 2200    Advance goals of care discussion: Pt is Full code.  Family Communication: no family was present at bedside, at the time of interview.   Data Reviewed: I have personally reviewed and interpreted daily labs, tele strips, imaging. Strep pneumo urine antigen positive. COVID-19 infection positive. Hemoglobin 12.1.  Platelet mildly low from 105.  Physical Exam:  General: Appear in mild distress, no Rash; Oral Mucosa Clear, moist. no Abnormal Neck Mass Or lumps, Conjunctiva normal  Cardiovascular: S1 and S2 Present, no Murmur, Respiratory: increased respiratory effort, Bilateral Air entry present and bilateral  Crackles, no wheezes Abdomen: Bowel Sound present, Soft and no tenderness Extremities: trace Pedal edema Neurology: alert and oriented to time, place, and person affect appropriate. no new focal deficit Gait not checked due to patient safety concerns    Vitals:   12/26/20 0002 12/26/20 0411 12/26/20 0846 12/26/20 1542  BP: (!) 153/64 140/84 (!) 147/86 (!) 168/90  Pulse: (!) 57 (!) 56 (!) 107 88  Resp: 18 20 18 20   Temp: 97.7 F (36.5 C) (!) 97.4 F (36.3 C) 98.9 F (37.2 C) 98.2 F (36.8 C)  TempSrc:  Oral  Oral  SpO2: 97% 98% 98% 99%  Weight:  Height:        Disposition:  Status is: Inpatient  Remains inpatient appropriate because:IV treatments appropriate due to intensity of illness or inability to take PO  Dispo: The patient is from: Home              Anticipated d/c is to: Home              Patient currently is not medically stable to d/c.   Difficult to place patient No  Time  spent: 35 minutes. I reviewed all nursing notes, pharmacy notes, vitals, pertinent old records. I have discussed plan of care as described above with RN.  Author: Lynden Oxford, MD [facility] 12/26/2020 6:26 PM  To reach On-call, see care teams to locate the attending and reach out via [email or web address]. Between 7PM-7AM, please contact night-coverage If you still have difficulty reaching the attending provider, please page the [facility] (Director on Call) for [facility] on amion for assistance.

## 2020-12-27 ENCOUNTER — Inpatient Hospital Stay: Payer: No Typology Code available for payment source

## 2020-12-27 LAB — CBC WITH DIFFERENTIAL/PLATELET
Abs Immature Granulocytes: 0.11 10*3/uL — ABNORMAL HIGH (ref 0.00–0.07)
Basophils Absolute: 0 10*3/uL (ref 0.0–0.1)
Basophils Relative: 0 %
Eosinophils Absolute: 0 10*3/uL (ref 0.0–0.5)
Eosinophils Relative: 0 %
HCT: 35.7 % — ABNORMAL LOW (ref 39.0–52.0)
Hemoglobin: 12.4 g/dL — ABNORMAL LOW (ref 13.0–17.0)
Immature Granulocytes: 1 %
Lymphocytes Relative: 3 %
Lymphs Abs: 0.3 10*3/uL — ABNORMAL LOW (ref 0.7–4.0)
MCH: 30.7 pg (ref 26.0–34.0)
MCHC: 34.7 g/dL (ref 30.0–36.0)
MCV: 88.4 fL (ref 80.0–100.0)
Monocytes Absolute: 0.2 10*3/uL (ref 0.1–1.0)
Monocytes Relative: 2 %
Neutro Abs: 8.9 10*3/uL — ABNORMAL HIGH (ref 1.7–7.7)
Neutrophils Relative %: 94 %
Platelets: 131 10*3/uL — ABNORMAL LOW (ref 150–400)
RBC: 4.04 MIL/uL — ABNORMAL LOW (ref 4.22–5.81)
RDW: 13.7 % (ref 11.5–15.5)
WBC: 9.6 10*3/uL (ref 4.0–10.5)
nRBC: 0 % (ref 0.0–0.2)

## 2020-12-27 LAB — COMPREHENSIVE METABOLIC PANEL
ALT: 14 U/L (ref 0–44)
AST: 20 U/L (ref 15–41)
Albumin: 3.2 g/dL — ABNORMAL LOW (ref 3.5–5.0)
Alkaline Phosphatase: 46 U/L (ref 38–126)
Anion gap: 9 (ref 5–15)
BUN: 24 mg/dL — ABNORMAL HIGH (ref 8–23)
CO2: 26 mmol/L (ref 22–32)
Calcium: 8.3 mg/dL — ABNORMAL LOW (ref 8.9–10.3)
Chloride: 99 mmol/L (ref 98–111)
Creatinine, Ser: 0.65 mg/dL (ref 0.61–1.24)
GFR, Estimated: >60 mL/min (ref >60)
Glucose, Bld: 139 mg/dL — ABNORMAL HIGH (ref 70–99)
Potassium: 3.7 mmol/L (ref 3.5–5.1)
Sodium: 134 mmol/L — ABNORMAL LOW (ref 135–145)
Total Bilirubin: 0.5 mg/dL (ref 0.3–1.2)
Total Protein: 6.2 g/dL — ABNORMAL LOW (ref 6.5–8.1)

## 2020-12-27 LAB — LEGIONELLA PNEUMOPHILA SEROGP 1 UR AG: L. pneumophila Serogp 1 Ur Ag: NEGATIVE

## 2020-12-27 LAB — TECHNOLOGIST SMEAR REVIEW: Plt Morphology: NORMAL

## 2020-12-27 LAB — MAGNESIUM: Magnesium: 1.8 mg/dL (ref 1.7–2.4)

## 2020-12-27 LAB — C-REACTIVE PROTEIN: CRP: 10.5 mg/dL — ABNORMAL HIGH (ref <1.0)

## 2020-12-27 LAB — FIBRINOGEN: Fibrinogen: 469 mg/dL (ref 210–475)

## 2020-12-27 LAB — D-DIMER, QUANTITATIVE: D-Dimer, Quant: 0.78 ug/mL-FEU — ABNORMAL HIGH (ref 0.00–0.50)

## 2020-12-27 MED ORDER — ENSURE ENLIVE PO LIQD
237.0000 mL | Freq: Three times a day (TID) | ORAL | Status: DC
  Administered 2020-12-27 – 2020-12-29 (×6): 237 mL via ORAL

## 2020-12-27 MED ORDER — ADULT MULTIVITAMIN W/MINERALS CH
1.0000 | ORAL_TABLET | Freq: Every day | ORAL | Status: DC
  Administered 2020-12-27 – 2020-12-29 (×3): 1 via ORAL
  Filled 2020-12-27 (×3): qty 1

## 2020-12-27 NOTE — Progress Notes (Signed)
[facility] Progress Note  Patient: Ian Herman    QMV:[id number]  DOA: 12/25/2020     Date of Service: the patient was seen and examined on 12/27/2020  Brief hospital course: Past medical history of COPD, GERD, HTN, PTSD, CAD, CHF, smoker, depression.  Presents with complaints of shortness of breath and anxiety. 7/16 admitted with COVID-19 pneumonia.  Strep pneumo antigen positive as well. 7/18 reports left foot pain from blast injury, x-ray confirms foreign body, general surgery consulted. Currently plan is treat pneumonia and COVID-19 monitor for improvement in oxygenation..  Subjective: Reports that he had landed his left leg on a Christmas ornament and had glass injury and would like [place] to remove it.  Breathing improving.  Cough improving.  No chest pain.  No nausea no vomiting.  Oral intake improving.  Assessment and Plan: 1. Acute on chronic respiratory failure with hypoxia COPD exacerbation Strep pneumo community-acquired pneumonia. COVID-19 pneumonia. Chest x-ray shows infiltrate.  On presentation tachycardic and tachypneic meeting SIRS criteria. COVID-19 test positive as well. Strep pneumo urine antigen also positive. Blood cultures so far negative. Procalcitonin mildly elevated. Will treat with IV Solu-Medrol, continue with IV antibiotics. Patient was started on IV remdesivir and steroids which we will continue for COVID-19 infection as well. Vitamin C as well. No baricitinib or similar medication secondary to bacterial infection.  2.  Left foot foreign body-glass injury Patient reports injury to his left foot with a Christmas ornament. X-ray confirms presence of foreign body. General surgery will be consulted for further assistance.  3.  Severe protein calorie malnutrition. Underweight. Body mass index is 15.81 kg/m.  Dietary consulted.  Appreciate assistance.  Continue with supplements.  4.  Acute on chronic hypoxic respiratory failure secondary to  COVID-19 and COPD as well as pneumonia Continue supplemental oxygen. Does not use oxygen at home although he supposed to.  5.  CAD HLD HTN Currently no chest pain.  No nausea no vomiting. Continue aspirin continue simvastatin. Holding off on lisinopril due to soft blood pressure.  6.  Active smoker Nicotine patch.  7.  Acute thrombocytopenia. Likely related to acute illness. CT PE protocol negative for PE. Platelet count improving. Continue Lovenox.  Scheduled Meds:  aspirin EC  81 mg Oral Daily   enoxaparin (LOVENOX) injection  40 mg Subcutaneous Q24H   feeding supplement  237 mL Oral TID BM   Ipratropium-Albuterol  1 puff Inhalation QID   mouth rinse  15 mL Mouth Rinse BID   methylPREDNISolone (SOLU-MEDROL) injection  40 mg Intravenous Q12H   mometasone-formoterol  2 puff Inhalation BID   multivitamin with minerals  1 tablet Oral Daily   nicotine  21 mg Transdermal Daily   simvastatin  20 mg Oral QPM   Continuous Infusions:  cefTRIAXone (ROCEPHIN)  IV 1 g (12/27/20 1443)   remdesivir 100 mg in NS 100 mL 100 mg (12/27/20 0921)   PRN Meds: acetaminophen, albuterol, dextromethorphan-guaiFENesin, hydrALAZINE, ondansetron (ZOFRAN) IV, polyvinyl alcohol  Body mass index is 15.81 kg/m.  Nutrition Problem: Severe Malnutrition Etiology: chronic illness (CHF, COPD)     DVT Prophylaxis:   enoxaparin (LOVENOX) injection 40 mg Start: 12/25/20 2200    Advance goals of care discussion: Pt is Full code.  Family Communication: no family was present at bedside, at the time of interview.  Data Reviewed: I have personally reviewed and interpreted daily labs, tele strips, imaging. LFTs stable.  Serum creatinine stable.  Electrolytes stable. CRP 10.5. Hemoglobin stable.  Platelets now improving to 131.  Physical Exam:  General: Appear in mild distress, no Rash; Oral Mucosa Clear, moist. no Abnormal Neck Mass Or lumps, Conjunctiva normal  Cardiovascular: S1 and S2 Present, no  Murmur, Respiratory: increased respiratory effort, Bilateral Air entry present and bilateral  Crackles, no wheezes Abdomen: Bowel Sound present, Soft and no tenderness Extremities: trace Pedal edema Neurology: alert and oriented to time, place, and person affect appropriate. no new focal deficit Gait not checked due to patient safety concerns    Vitals:   12/27/20 0056 12/27/20 0415 12/27/20 1725 12/27/20 1800  BP: (!) 166/84 (!) 157/84 (!) 178/94 (!) 161/88  Pulse: 82 74 88   Resp: 20 18 (!) 28 20  Temp:  97.8 F (36.6 C) 97.7 F (36.5 C)   TempSrc:  Oral Oral   SpO2: 98% 97% 97%   Weight:      Height:        Disposition:  Status is: Inpatient  Remains inpatient appropriate because:Inpatient level of care appropriate due to severity of illness  Dispo: The patient is from: Home              Anticipated d/c is to: Home              Patient currently is not medically stable to d/c.   Difficult to place patient No        Time spent: 35 minutes. I reviewed all nursing notes, pharmacy notes, vitals, pertinent old records. I have discussed plan of care as described above with RN.  Author: Lynden Oxford, MD [facility] 12/27/2020 7:21 PM  To reach On-call, see care teams to locate the attending and reach out via [email or web address]. Between 7PM-7AM, please contact night-coverage If you still have difficulty reaching the attending provider, please page the [facility] (Director on Call) for [facility] on amion for assistance.

## 2020-12-27 NOTE — Progress Notes (Signed)
Chaplain made initial phone contact with patient who expressed, "I'm not gonna die". Chaplain offered encouragement and joined patient in a brief conversation. He shared that he knows he is in "the good hands of the Lord". Chaplain offered prayer and is available for continued support as needed.

## 2020-12-27 NOTE — Progress Notes (Signed)
Mobility Specialist - Progress Note   12/27/20 1200  Mobility  Activity Dangled on edge of bed  Level of Assistance Standby assist, set-up cues, supervision of patient - no hands on  Distance Ambulated (ft) 0 ft  Mobility Response Tolerated fair  Mobility performed by Mobility specialist  $Mobility charge 1 Mobility    Pre-mobility: 84 HR, 97% SpO2   Pt lying in bed upon arrival, utilizing 1L. Weaned pt down to RA to retrieve sats at rest = 95%. Pt sat EOB without physical assistance, but declined further OOB activity d/t "having glass in foot". Also refusing to wear slip-resistant socks. Mobility informed pt on the importance of socks to prevent falls, continued to refuse wear. Pt reports he has been getting up to [facility]. Encouraged participation for possibility to wean off O2, but pt continued to decline. Pt returned supine, back on 1L prior to exit. Session concluded.    Ian Herman Mobility Specialist 12/27/20, 12:50 PM

## 2020-12-27 NOTE — Progress Notes (Signed)
Initial Nutrition Assessment  DOCUMENTATION CODES:  Underweight, Severe malnutrition in context of chronic illness  INTERVENTION:  Liberalize diet to regular with no added salt packets to promote oral intake the the presence of low BMI, advanced age, and COVID19 infection Increase frequency of Ensure Enlive po to TID, each supplement provides 350 kcal and 20 grams of protein MVI with minerals daily Magic cup TID with meals, each supplement provides 290 kcal and 9 grams of protein Request new measured weight  NUTRITION DIAGNOSIS:  Severe Malnutrition related to chronic illness (CHF, COPD) as evidenced by severe fat depletion, severe muscle depletion.  GOAL:  Patient will meet greater than or equal to 90% of their needs   MONITOR:  PO intake, Supplement acceptance, Labs, Weight trends  REASON FOR ASSESSMENT:  Consult Assessment of nutrition requirement/status  ASSESSMENT:  75 y.o. male with history of CHF, COPD, CAD, GERD, HTN, and Hx of MI presented to ED with shortness of breath worsening over the last few days PTA. Imaging suggestive of pneumonia, found to be positive for COVID19   Pt resting in bed at the time of assessment. Pt reports that appetite is fine and that he has been drinking the ensures he has received. Does endorse weight loss PTA over the last new months. Reports a decrease in energy levels over the last few months coinciding with weight loss. Pt very depleted in muscle and fat stores on exam.  Enquired about appetite PTA, pt somewhat elusive with his answers, states he eats "when he feels like it" and eats "whatever he feels like eating." Does report that he receives meals on wheels and also receives ensure through the [place] which he consumes at least 2x/d.   Will add magic cup to provide additional kcal and protein at meal times and increase frequency of ensure to TID. Also recommend liberalizing diet as pt is severely malnourished with COVID19 and advanced  age.  Nutritionally Relevant Medications: Scheduled Meds:  feeding supplement  237 mL Oral TID BM   methylPREDNISolone (SOLU-MEDROL) injection  40 mg Intravenous Q12H   multivitamin with minerals  1 tablet Oral Daily   simvastatin  20 mg Oral QPM   Continuous Infusions:  remdesivir 100 mg in NS 100 mL 100 mg (12/27/20 0921)   PRN Meds: ondansetron  Labs Reviewed: Na 134 BUN 24 SBG ranges from 139-147 mg/dL over the last 24 hours  NUTRITION - FOCUSED PHYSICAL EXAM: Flowsheet Row Most Recent Value  Orbital Region Severe depletion  Upper Arm Region Severe depletion  Thoracic and Lumbar Region Severe depletion  Buccal Region Severe depletion  Temple Region Severe depletion  Clavicle Bone Region Severe depletion  Clavicle and Acromion Bone Region Severe depletion  Scapular Bone Region Severe depletion  Dorsal Hand Moderate depletion  Patellar Region Severe depletion  Anterior Thigh Region Severe depletion  Posterior Calf Region Severe depletion  Edema (RD Assessment) None  Hair Reviewed  Eyes Reviewed  Mouth Reviewed  Skin Reviewed  Nails Reviewed   Diet Order:   Diet Order             Diet regular Room service appropriate? Yes; Fluid consistency: Thin  Diet effective now                   EDUCATION NEEDS:  No education needs have been identified at this time  Skin:  Skin Assessment: Reviewed RN Assessment  Last BM:  PTA  Height:  Ht Readings from Last 1 Encounters:  12/25/20 5\' 8"  (  1.727 m)    Weight:  Wt Readings from Last 1 Encounters:  12/25/20 47.2 kg    Ideal Body Weight:  70 kg  BMI:  Body mass index is 15.81 kg/m.  Estimated Nutritional Needs:  Kcal:  1500-1800 kcal/d Protein:  80-100g/d Fluid:  1.8-2 L/d  Greig Castilla, RD, LDN Clinical Dietitian Pager on Amion

## 2020-12-28 DIAGNOSIS — M60272 Foreign body granuloma of soft tissue, not elsewhere classified, left ankle and foot: Secondary | ICD-10-CM | POA: Diagnosis not present

## 2020-12-28 MED ORDER — CEPHALEXIN 500 MG PO CAPS
500.0000 mg | ORAL_CAPSULE | Freq: Three times a day (TID) | ORAL | Status: DC
  Administered 2020-12-28 – 2020-12-29 (×2): 500 mg via ORAL
  Filled 2020-12-28 (×2): qty 1

## 2020-12-28 MED ORDER — SENNOSIDES-DOCUSATE SODIUM 8.6-50 MG PO TABS
1.0000 | ORAL_TABLET | Freq: Two times a day (BID) | ORAL | Status: DC
  Administered 2020-12-28 – 2020-12-29 (×2): 1 via ORAL
  Filled 2020-12-28 (×2): qty 1

## 2020-12-28 MED ORDER — POLYETHYLENE GLYCOL 3350 17 G PO PACK
17.0000 g | PACK | Freq: Every day | ORAL | Status: DC
  Administered 2020-12-28 – 2020-12-29 (×2): 17 g via ORAL
  Filled 2020-12-28: qty 1

## 2020-12-28 MED ORDER — METOPROLOL TARTRATE 25 MG PO TABS
25.0000 mg | ORAL_TABLET | Freq: Two times a day (BID) | ORAL | Status: DC
  Administered 2020-12-28 – 2020-12-29 (×2): 25 mg via ORAL
  Filled 2020-12-28 (×2): qty 1

## 2020-12-28 MED ORDER — ALUM & MAG HYDROXIDE-SIMETH 200-200-20 MG/5ML PO SUSP
30.0000 mL | ORAL | Status: DC | PRN
  Administered 2020-12-28 (×2): 30 mL via ORAL
  Filled 2020-12-28 (×3): qty 30

## 2020-12-28 MED ORDER — ALUM & MAG HYDROXIDE-SIMETH 200-200-20 MG/5ML PO SUSP
30.0000 mL | ORAL | Status: DC | PRN
  Administered 2020-12-28: 30 mL via ORAL

## 2020-12-28 MED ORDER — PANTOPRAZOLE SODIUM 40 MG PO TBEC
40.0000 mg | DELAYED_RELEASE_TABLET | Freq: Every day | ORAL | Status: DC
  Administered 2020-12-28 – 2020-12-29 (×2): 40 mg via ORAL
  Filled 2020-12-28 (×2): qty 1

## 2020-12-28 MED ORDER — ONDANSETRON HCL 4 MG/2ML IJ SOLN: 4.0000 mg | Freq: Four times a day (QID) | INTRAMUSCULAR | Status: DC | PRN
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
## 2020-12-29 DIAGNOSIS — J9621 Acute and chronic respiratory failure with hypoxia: Secondary | ICD-10-CM

## 2020-12-29 DIAGNOSIS — I10 Essential (primary) hypertension: Secondary | ICD-10-CM

## 2020-12-29 DIAGNOSIS — Z720 Tobacco use: Secondary | ICD-10-CM

## 2020-12-29 DIAGNOSIS — J441 Chronic obstructive pulmonary disease with (acute) exacerbation: Secondary | ICD-10-CM

## 2020-12-29 DIAGNOSIS — R6520 Severe sepsis without septic shock: Secondary | ICD-10-CM

## 2020-12-29 DIAGNOSIS — J189 Pneumonia, unspecified organism: Secondary | ICD-10-CM

## 2020-12-29 DIAGNOSIS — I5032 Chronic diastolic (congestive) heart failure: Secondary | ICD-10-CM

## 2020-12-29 DIAGNOSIS — E43 Unspecified severe protein-calorie malnutrition: Secondary | ICD-10-CM

## 2020-12-29 DIAGNOSIS — A419 Sepsis, unspecified organism: Secondary | ICD-10-CM

## 2020-12-29 LAB — COMPREHENSIVE METABOLIC PANEL
ALT: 38 U/L (ref 0–44)
AST: 38 U/L (ref 15–41)
Albumin: 3.3 g/dL — ABNORMAL LOW (ref 3.5–5.0)
Alkaline Phosphatase: 54 U/L (ref 38–126)
Anion gap: 4 — ABNORMAL LOW (ref 5–15)
BUN: 26 mg/dL — ABNORMAL HIGH (ref 8–23)
CO2: 30 mmol/L (ref 22–32)
Calcium: 8.4 mg/dL — ABNORMAL LOW (ref 8.9–10.3)
Chloride: 98 mmol/L (ref 98–111)
Creatinine, Ser: 0.62 mg/dL (ref 0.61–1.24)
GFR, Estimated: >60 mL/min (ref >60)
Glucose, Bld: 195 mg/dL — ABNORMAL HIGH (ref 70–99)
Potassium: 4.3 mmol/L (ref 3.5–5.1)
Sodium: 132 mmol/L — ABNORMAL LOW (ref 135–145)
Total Bilirubin: 0.5 mg/dL (ref 0.3–1.2)
Total Protein: 6.3 g/dL — ABNORMAL LOW (ref 6.5–8.1)

## 2020-12-29 LAB — CBC WITH DIFFERENTIAL/PLATELET
Abs Immature Granulocytes: 0.04 10*3/uL (ref 0.00–0.07)
Basophils Absolute: 0 10*3/uL (ref 0.0–0.1)
Basophils Relative: 0 %
Eosinophils Absolute: 0 10*3/uL (ref 0.0–0.5)
Eosinophils Relative: 0 %
HCT: 38.7 % — ABNORMAL LOW (ref 39.0–52.0)
Hemoglobin: 13.4 g/dL (ref 13.0–17.0)
Immature Granulocytes: 1 %
Lymphocytes Relative: 4 %
Lymphs Abs: 0.2 10*3/uL — ABNORMAL LOW (ref 0.7–4.0)
MCH: 30.6 pg (ref 26.0–34.0)
MCHC: 34.6 g/dL (ref 30.0–36.0)
MCV: 88.4 fL (ref 80.0–100.0)
Monocytes Absolute: 0.2 10*3/uL (ref 0.1–1.0)
Monocytes Relative: 4 %
Neutro Abs: 5.5 10*3/uL (ref 1.7–7.7)
Neutrophils Relative %: 91 %
Platelets: 142 10*3/uL — ABNORMAL LOW (ref 150–400)
RBC: 4.38 MIL/uL (ref 4.22–5.81)
RDW: 13.5 % (ref 11.5–15.5)
WBC: 6 10*3/uL (ref 4.0–10.5)
nRBC: 0 % (ref 0.0–0.2)

## 2020-12-29 LAB — MAGNESIUM: Magnesium: 2 mg/dL (ref 1.7–2.4)

## 2020-12-29 LAB — C-REACTIVE PROTEIN: CRP: 3.3 mg/dL — ABNORMAL HIGH (ref <1.0)

## 2020-12-29 MED ORDER — SODIUM CHLORIDE 0.9 % IV SOLN
1.0000 g | Freq: Once | INTRAVENOUS | Status: DC
  Filled 2020-12-29: qty 10

## 2020-12-29 MED ORDER — METOPROLOL TARTRATE 50 MG PO TABS: 50.0000 mg | ORAL_TABLET | Freq: Two times a day (BID) | ORAL | 0 refills | Status: AC

## 2020-12-29 NOTE — Progress Notes (Signed)
AVS reviewed and pt verbalized understanding of all instructions. Pt denies any concerns at this time and NAD noted prior to discharge.

## 2020-12-29 NOTE — Plan of Care (Signed)

## 2020-12-29 NOTE — Progress Notes (Signed)
Pt oxygen sats noted at 100% on RA, at rest. Pt ambulated in room sats dropped to 93% and did not require oxygen to keep sats above 92%.

## 2020-12-29 NOTE — Plan of Care (Signed)

## 2020-12-29 NOTE — Discharge Summary (Signed)
Physician Discharge Summary  Ian Herman QZR:[id number] DOB: 23-Mar-1946 DOA: 12/25/2020  PCP: Louanne Skye, MD  Admit date: 12/25/2020 Discharge date: 12/29/2020  Admitted From: Home Disposition: Home   Recommendations for Outpatient Follow-up:  Follow up with PCP in 1-2 weeks Follow up with podiatry as outpatient for glass removal from foot.  Home Health: None Equipment/Devices: None, did not desaturate on ambulatory pulse oximetry Discharge Condition: Stable CODE STATUS: Full Diet recommendation: Heart healthy  Brief/Interim Summary: Past medical history of COPD, GERD, HTN, PTSD, CAD, CHF, smoker, depression.  Presents with complaints of shortness of breath and anxiety. 7/16 admitted with COVID-19 pneumonia.  Strep pneumo antigen positive as well. 7/18 reports left foot pain from blast injury, x-ray confirms foreign body.  7/19, podiatry consulted, unable to remove glass foreign body at bedside.  7/20 Completed Tx, respiratory status normalized, hypoxia resolved.   Discharge Diagnoses:  Principal Problem:   Acute on chronic respiratory failure with hypoxia (HCC) Active Problems:   Protein-calorie malnutrition, severe   COPD exacerbation (HCC)   Coronary artery disease   Depression   Hypertension   HLD (hyperlipidemia)   CAP (community acquired pneumonia)   Severe sepsis (HCC)   Tobacco abuse   Chronic diastolic CHF (congestive heart failure) (HCC)  Acute on chronic hypoxic respiratory failure secondary to COVID-19 and COPD with exacerbation as well as Streptococcal pneumonia:  - Has completed abx and remdesivir as well as steroids x5 days. Though he desires supplemental oxygen at home, he does not qualify for it after treatment.   Left foot foreign body (glass): Seen on XR. Podiatry unable to remove at bedside, recommends follow up for removal under fluoroscopy.    CAD HLD HTN SVT Currently no chest pain.  Continue metoprolol, aspirin, statin.    Tobacco  use: Cessation counseling provided.   Acute thrombocytopenia: Likely related to acute illness. Improved.  Declines PT/OT evaluations prior to discharge.Plans to continue using cane at home, WBAT per podiatry recommendations. Will follow up with podiatry as outpatient to schedule glass removal under fluoro.  Estimated body mass index is 15.81 kg/m as calculated from the following:   Height as of this encounter: 5\' 8"  (1.727 m).   Weight as of this encounter: 47.2 kg.  Nutrition Problem: Severe Malnutrition Etiology: chronic illness (CHF, COPD) - Continue Interventions: Ensure Enlive (each supplement provides 350kcal and 20 grams of protein), MVI, Magic cup, Liberalize Diet   Discharge Instructions Discharge Instructions     Diet - low sodium heart healthy   Complete by: As directed    Discharge instructions   Complete by: As directed    You were evaluated for pneumonia due BOTH to Streptococcus pneumonia (bacteria) AND covid-19 and have completed treatment for both. You've also received 5 days of steroids with resolution of wheezing and improved respiratory status. You are stable for discharge with plans to continue isolation for a total of 10 days and follow up with podiatry as an outpatient after isolation period to schedule removal of the glass from the left foot.   If your symptoms worsen, seek medical attention right away.   Increase activity slowly   Complete by: As directed       Allergies as of 12/29/2020       Reactions   Codeine Itching   itching   Thorazine [chlorpromazine] Hives   Hives        Medication List     STOP taking these medications    lisinopril 40 MG tablet Commonly known  as: ZESTRIL   predniSONE 10 MG tablet Commonly known as: DELTASONE       TAKE these medications    aspirin EC 81 MG tablet Take 81 mg by mouth daily.   carboxymethylcellulose 0.5 % Soln Commonly known as: REFRESH PLUS Apply 1 drop to eye 3 (three) times daily as  needed (dry eyes).   ENSURE PLUS PO Take 1 Can by mouth See admin instructions. Consume contents of 1 can every day separate from meals. Do not use as meal replacement.   fluticasone-salmeterol 250-50 MCG/ACT Aepb Commonly known as: ADVAIR Inhale 1 puff into the lungs in the morning and at bedtime.   Ipratropium-Albuterol 20-100 MCG/ACT Aers respimat Commonly known as: COMBIVENT Inhale 1 puff into the lungs every 6 (six) hours as needed for wheezing or shortness of breath.   ipratropium-albuterol 0.5-2.5 (3) MG/3ML Soln Commonly known as: DUONEB Take 3 mLs by nebulization every 6 (six) hours for 3 days.   metoprolol tartrate 50 MG tablet Commonly known as: LOPRESSOR Take 1 tablet (50 mg total) by mouth 2 (two) times daily.   sildenafil 100 MG tablet Commonly known as: VIAGRA Take 100 mg by mouth as needed for erectile dysfunction.   simvastatin 20 MG tablet Commonly known as: ZOCOR Take 20 mg by mouth every evening.               Durable Medical Equipment  (From admission, onward)           Start     Ordered   12/29/20 1116  For home use only DME oxygen  Once       Question Answer Comment  Length of Need 6 Months   Mode or (Route) Nasal cannula   Liters per Minute 2   Frequency Continuous (stationary and portable oxygen unit needed)   Oxygen delivery system Gas      12/29/20 1115            Follow-up Information     Louanne Skye, MD Follow up.   Specialty: Family Medicine Contact information: [address] [place] [address] [phone]         Felecia Shelling, DPM. Schedule an appointment as soon as possible for a visit in 1 week(s).   Specialty: Podiatry Contact information: [address] [place] [address] [place] [address] [phone]                Allergies  Allergen Reactions   Codeine Itching    itching   Thorazine [Chlorpromazine] Hives    Hives     Consultations: Podiatry, Dr. [name].    Procedures/Studies: DG Chest 2 View  Result Date: 12/25/2020 CLINICAL DATA:  Shortness of breath, increased today. EXAM: CHEST - 2 VIEW COMPARISON:  08/02/2018 FINDINGS: Normal sized heart. Interval patchy opacity in the inferior left lower lobe. The remainder of the lungs are clear. The lungs remain hyperexpanded with mild diffuse bullous changes. A right skin fold is again demonstrated. Tortuous and partially calcified thoracic aorta. Unremarkable bones. IMPRESSION: 1. Mild patchy atelectasis or pneumonia in the inferior left lower lobe. 2. Stable changes of COPD. Electronically Signed   By: Beckie Salts M.D.   On: 12/25/2020 11:58   DG Foot Complete Left  Result Date: 12/27/2020 CLINICAL DATA:  complains of pain at distal first metatarsal area on posterior side of left foot. He said there is a piece of glass in it. EXAM: LEFT FOOT - COMPLETE 3+ VIEW COMPARISON:  None. FINDINGS: There is  no evidence of fracture or dislocation. There is no evidence of arthropathy or other focal bone abnormality. There is a 4 mm radiopaque linear/slightly triangular density within the plantar soft tissues at the level of the metatarsal heads on lateral view. Finding not visualized on oblique or frontal view due to overlying osseous structures. IMPRESSION: A 4 mm radiopaque linear density within the plantar soft tissues at the level of the metatarsal heads on lateral view could represent the piece of glass as per history. These results will be called to the ordering clinician or representative by the Radiologist Assistant, and communication documented in the PACS or [employer] Dashboard. Electronically Signed   By: Tish FredericksonMorgane  Naveau M.D.   On: 12/27/2020 17:14   Attempt at foreign body removal at bedside 7/19 Dr. Logan BoresEvans.   Subjective: Feels better, wants to go home  Discharge Exam: Vitals:   12/29/20 0547 12/29/20 0852  BP: (!) 170/89 (!) 162/76  Pulse: 61 69  Resp: 18 20  Temp: 97.8 F (36.6 C) (!) 97.5 F (36.4 C)   SpO2: 98% 96%   General: Cachectic but not in acute distress Cardiovascular: RRR, S1/S2 +, no rubs, no gallops Respiratory: Clear, distant. Slight crackles at R base without wheezing.  Abdominal: Soft, NT, ND, bowel sounds + Extremities: No edema, no cyanosis  Labs: BNP (last 3 results) Recent Labs    12/25/20 1125  BNP 90.3   Basic Metabolic Panel: Recent Labs  Lab 12/25/20 1125 12/27/20 0830 12/29/20 0534  NA 136 134* 132*  K 3.6 3.7 4.3  CL 100 99 98  CO2 26 26 30   GLUCOSE 147* 139* 195*  BUN 19 24* 26*  CREATININE 1.08 0.65 0.62  CALCIUM 8.7* 8.3* 8.4*  MG  --  1.8 2.0   Liver Function Tests: Recent Labs  Lab 12/25/20 1125 12/27/20 0830 12/29/20 0534  AST 18 20 38  ALT 13 14 38  ALKPHOS 72 46 54  BILITOT 0.7 0.5 0.5  PROT 7.8 6.2* 6.3*  ALBUMIN 4.3 3.2* 3.3*   No results for input(s): LIPASE, AMYLASE in the last 168 hours. No results for input(s): AMMONIA in the last 168 hours. CBC: Recent Labs  Lab 12/25/20 1125 12/26/20 0503 12/27/20 0830 12/29/20 0534  WBC 4.1 7.3 9.6 6.0  NEUTROABS 3.5  --  8.9* 5.5  HGB 14.3 12.1* 12.4* 13.4  HCT 42.8 36.0* 35.7* 38.7*  MCV 90.3 89.3 88.4 88.4  PLT 126* 105* 131* 142*   Cardiac Enzymes: No results for input(s): CKTOTAL, CKMB, CKMBINDEX, TROPONINI in the last 168 hours. BNP: Invalid input(s): POCBNP CBG: No results for input(s): GLUCAP in the last 168 hours. D-Dimer Recent Labs    12/27/20 0830  DDIMER 0.78*   Hgb A1c No results for input(s): HGBA1C in the last 72 hours. Lipid Profile No results for input(s): CHOL, HDL, LDLCALC, TRIG, CHOLHDL, LDLDIRECT in the last 72 hours. Thyroid function studies No results for input(s): TSH, T4TOTAL, T3FREE, THYROIDAB in the last 72 hours.  Invalid input(s): FREET3 Anemia work up No results for input(s): VITAMINB12, FOLATE, FERRITIN, TIBC, IRON, RETICCTPCT in the last 72 hours. Urinalysis    Component Value Date/Time   COLORURINE YELLOW (A) 12/25/2020  1215   APPEARANCEUR CLEAR (A) 12/25/2020 1215   APPEARANCEUR Clear 03/15/2013 1642   LABSPEC 1.018 12/25/2020 1215   LABSPEC 1.016 03/15/2013 1642   PHURINE 6.0 12/25/2020 1215   GLUCOSEU NEGATIVE 12/25/2020 1215   GLUCOSEU Negative 03/15/2013 1642   HGBUR MODERATE (A) 12/25/2020 1215  BILIRUBINUR NEGATIVE 12/25/2020 1215   BILIRUBINUR Negative 03/15/2013 1642   KETONESUR NEGATIVE 12/25/2020 1215   PROTEINUR 30 (A) 12/25/2020 1215   NITRITE NEGATIVE 12/25/2020 1215   LEUKOCYTESUR NEGATIVE 12/25/2020 1215   LEUKOCYTESUR 1+ 03/15/2013 1642    Microbiology Recent Results (from the past 240 hour(s))  Culture, blood (Routine x 2)     Status: None (Preliminary result)   Collection Time: 12/25/20 11:25 AM   Specimen: Right Antecubital; Blood  Result Value Ref Range Status   Specimen Description RIGHT ANTECUBITAL  Final   Special Requests   Final    BOTTLES DRAWN AEROBIC AND ANAEROBIC Blood Culture adequate volume   Culture   Final    NO GROWTH 4 DAYS Performed at [facility], [address] [place], [place] [address]    Report Status PENDING  Incomplete  Culture, blood (Routine x 2)     Status: None (Preliminary result)   Collection Time: 12/25/20 11:30 AM   Specimen: Left Antecubital; Blood  Result Value Ref Range Status   Specimen Description LEFT ANTECUBITAL  Final   Special Requests   Final    BOTTLES DRAWN AEROBIC AND ANAEROBIC Blood Culture results may not be optimal due to an excessive volume of blood received in culture bottles   Culture   Final    NO GROWTH 4 DAYS Performed at [facility], [address] [place], [place] [address]    Report Status PENDING  Incomplete  Resp Panel by RT-PCR (Flu A&B, Covid) Nasopharyngeal Swab     Status: Abnormal   Collection Time: 12/25/20 12:10 PM   Specimen: Nasopharyngeal Swab; Nasopharyngeal(NP) swabs in vial transport medium  Result Value Ref Range Status   SARS Coronavirus 2 by RT PCR POSITIVE (A)  NEGATIVE Final    Comment: RESULT CALLED TO, READ BACK BY AND VERIFIED WITH: Lad[facility] ED @ 1941 12/25/2020 [facility] (NOTE) SARS-CoV-2 target nucleic acids are DETECTED.  The SARS-CoV-2 RNA is generally detectable in upper respiratory specimens during the acute phase of infection. Positive results are indicative of the presence of the identified virus, but do not rule out bacterial infection or co-infection with other pathogens not detected by the test. Clinical correlation with patient history and other diagnostic information is necessary to determine patient infection status. The expected result is Negative.  Fact Sheet for Patients: BloggerCourse.com  Fact Sheet for Healthcare Providers: SeriousBroker.it  This test is not yet approved or cleared by the [place] FDA and  has been authorized for detection and/or diagnosis of SARS-CoV-2 by FDA under an Emergency Use Authorization (EUA).  This EUA will remain in effect (meaning this test ca n be used) for the duration of  the COVID-19 declaration under Section 564(b)(1) of the Act, 21 U.S.C. section 360bbb-3(b)(1), unless the authorization is terminated or revoked sooner.     Influenza A by PCR NEGATIVE NEGATIVE Final   Influenza B by PCR NEGATIVE NEGATIVE Final    Comment: (NOTE) The Xpert Xpress SARS-CoV-2/FLU/RSV plus assay is intended as an aid in the diagnosis of influenza from Nasopharyngeal swab specimens and should not be used as a sole basis for treatment. Nasal washings and aspirates are unacceptable for Xpert Xpress SARS-CoV-2/FLU/RSV testing.  Fact Sheet for Patients: BloggerCourse.com  Fact Sheet for Healthcare Providers: SeriousBroker.it  This test is not yet approved or cleared by the [place] FDA and has been authorized for detection and/or diagnosis of SARS-CoV-2 by FDA under an Emergency Use  Authorization (EUA). This EUA will  remain in effect (meaning this test can be used) for the duration of the COVID-19 declaration under Section 564(b)(1) of the Act, 21 U.S.C. section 360bbb-3(b)(1), unless the authorization is terminated or revoked.  Performed at [facility], [address], [place], [place] [address]     Time coordinating discharge: Approximately 40 minutes  Tyrone Nine, MD  [facility] 12/29/2020, 11:20 AM

## 2020-12-30 LAB — CULTURE, BLOOD (ROUTINE X 2)
Culture: NO GROWTH
Culture: NO GROWTH
Special Requests: ADEQUATE

## 2021-01-04 ENCOUNTER — Ambulatory Visit: Payer: Non-veteran care | Admitting: Podiatry

## 2021-01-13 ENCOUNTER — Emergency Department
Admission: EM | Admit: 2021-01-13 | Discharge: 2021-01-13 | Disposition: A | Discharge status: Home / Self Care | Payer: No Typology Code available for payment source | Attending: Emergency Medicine | Admitting: Emergency Medicine

## 2021-01-13 ENCOUNTER — Other Ambulatory Visit: Payer: Self-pay

## 2021-01-13 ENCOUNTER — Encounter: Payer: Self-pay | Admitting: Emergency Medicine

## 2021-01-13 ENCOUNTER — Emergency Department: Payer: No Typology Code available for payment source

## 2021-01-13 DIAGNOSIS — I2510 Atherosclerotic heart disease of native coronary artery without angina pectoris: Secondary | ICD-10-CM | POA: Insufficient documentation

## 2021-01-13 DIAGNOSIS — Z7982 Long term (current) use of aspirin: Secondary | ICD-10-CM | POA: Diagnosis not present

## 2021-01-13 DIAGNOSIS — Z1881 Retained glass fragments: Secondary | ICD-10-CM | POA: Diagnosis not present

## 2021-01-13 DIAGNOSIS — M60272 Foreign body granuloma of soft tissue, not elsewhere classified, left ankle and foot: Secondary | ICD-10-CM | POA: Insufficient documentation

## 2021-01-13 DIAGNOSIS — J441 Chronic obstructive pulmonary disease with (acute) exacerbation: Secondary | ICD-10-CM | POA: Diagnosis not present

## 2021-01-13 DIAGNOSIS — I110 Hypertensive heart disease with heart failure: Secondary | ICD-10-CM | POA: Diagnosis not present

## 2021-01-13 DIAGNOSIS — Z7951 Long term (current) use of inhaled steroids: Secondary | ICD-10-CM | POA: Insufficient documentation

## 2021-01-13 DIAGNOSIS — I5032 Chronic diastolic (congestive) heart failure: Secondary | ICD-10-CM | POA: Insufficient documentation

## 2021-01-13 DIAGNOSIS — M795 Residual foreign body in soft tissue: Secondary | ICD-10-CM

## 2021-01-13 DIAGNOSIS — Z79899 Other long term (current) drug therapy: Secondary | ICD-10-CM | POA: Insufficient documentation

## 2021-01-13 DIAGNOSIS — F17210 Nicotine dependence, cigarettes, uncomplicated: Secondary | ICD-10-CM | POA: Diagnosis not present

## 2021-01-13 MED ORDER — LIDOCAINE HCL (PF) 1 % IJ SOLN
5.0000 mL | Freq: Once | INTRAMUSCULAR | Status: AC
  Administered 2021-01-13: 5 mL
  Filled 2021-01-13: qty 5

## 2021-01-13 NOTE — ED Notes (Signed)
See triage note  Presents with possible f/b in left foot  States he stepped on glass on 7/29  Unable to remove on his own

## 2021-01-13 NOTE — Discharge Instructions (Signed)
You have a small (approx 5 mm) radiopaque FB noted on XR. It lies between your current wound and the base of your toes. Follow-up with Podiatry for further management.

## 2021-01-13 NOTE — ED Provider Notes (Signed)
[facility] Emergency Department Provider Note ____________________________________________  Time seen: 0720  I have reviewed the triage vital signs and the nursing notes.  HISTORY  Chief Complaint  Foot Pain   HPI Ian Herman is a 76 y.o. male with below medical history, presents to the ED for evaluation of retained foreign body sensation to the left foot.  Patient reports's small piece of glass inside of the foot an EMS-transported visit he had on 7/16.  Patient reports he got the shard of glass, by walking to the ambulance.  He has been soaking the foot and warm water without significant benefit.  He presents to the ED for evaluation of his injury.  Past Medical History:  Diagnosis Date   Arthritis    Bronchitis    CHF (congestive heart failure) (HCC)    COPD (chronic obstructive pulmonary disease) (HCC)    Coronary artery disease    Depression    GERD (gastroesophageal reflux disease)    Hepatitis C    Hypertension    Myocardial infarction acute (HCC)    Pleural effusion    PTSD (post-traumatic stress disorder)    Shortness of breath dyspnea     Patient Active Problem List   Diagnosis Date Noted   Acute on chronic respiratory failure with hypoxia (HCC) 12/25/2020   Coronary artery disease    Depression    Hypertension    HLD (hyperlipidemia)    CAP (community acquired pneumonia)    Severe sepsis (HCC)    Tobacco abuse    Chronic diastolic CHF (congestive heart failure) (HCC)    COPD exacerbation (HCC)    Anxiety state    Advanced care planning/counseling discussion    Goals of care, counseling/discussion    Palliative care by specialist    Acute on chronic respiratory failure with hypoxia and hypercapnia (HCC) 08/02/2018   Protein-calorie malnutrition, severe 08/02/2018    Past Surgical History:  Procedure Laterality Date   APPENDECTOMY     CORONARY ANGIOPLASTY     stent   INGUINAL HERNIA REPAIR Left 04/23/2015   Procedure:  HERNIA REPAIR INGUINAL ADULT;  Surgeon: Nadeen Landau, MD;  Location: [facility] ORS;  Service: General;  Laterality: Left;    Prior to Admission medications   Medication Sig Start Date End Date Taking? Authorizing Provider  aspirin EC 81 MG tablet Take 81 mg by mouth daily.    [provider]  carboxymethylcellulose (REFRESH PLUS) 0.5 % SOLN Apply 1 drop to eye 3 (three) times daily as needed (dry eyes).    [provider]  fluticasone-salmeterol (ADVAIR) 250-50 MCG/ACT AEPB Inhale 1 puff into the lungs in the morning and at bedtime. 01/11/20   [provider]  Ipratropium-Albuterol (COMBIVENT) 20-100 MCG/ACT AERS respimat Inhale 1 puff into the lungs every 6 (six) hours as needed for wheezing or shortness of breath.    [provider]  ipratropium-albuterol (DUONEB) 0.5-2.5 (3) MG/3ML SOLN Take 3 mLs by nebulization every 6 (six) hours for 3 days. 08/07/18 12/25/20  Auburn Bilberry, MD  metoprolol tartrate (LOPRESSOR) 50 MG tablet Take 1 tablet (50 mg total) by mouth 2 (two) times daily. 12/29/20   Tyrone Nine, MD  Nutritional Supplements (ENSURE PLUS PO) Take 1 Can by mouth See admin instructions. Consume contents of 1 can every day separate from meals. Do not use as meal replacement. 01/27/20   [provider]  sildenafil (VIAGRA) 100 MG tablet Take 100 mg by mouth as needed for erectile dysfunction.  [provider]  simvastatin (ZOCOR) 20 MG tablet Take 20 mg by mouth every evening.    [provider]    Allergies Trazodone, Chlorpromazine, and Codeine  History reviewed. No pertinent family history.  Social History Social History   Tobacco Use   Smoking status: Every Day    Packs/day: 0.50    Types: Cigarettes   Smokeless tobacco: Never  Substance Use Topics   Alcohol use: Yes    Comment: occasionally per patient   Drug use: No    Comment: IV drug use history mult. years ago.    Review of Systems  Constitutional:  Negative for fever. Cardiovascular: Negative for chest pain. Respiratory: Negative for shortness of breath. Gastrointestinal: Negative for abdominal pain, vomiting and diarrhea. Genitourinary: Negative for dysuria. Musculoskeletal: Negative for back pain. Skin: Negative for rash.  Left foot foreign body as above. Neurological: Negative for headaches, focal weakness or numbness. ____________________________________________  PHYSICAL EXAM:  VITAL SIGNS: ED Triage Vitals  Enc Vitals Group     BP 01/13/21 0143 (!) 166/83     Pulse Rate 01/13/21 0143 94     Resp 01/13/21 0143 20     Temp 01/13/21 0143 98.9 F (37.2 C)     Temp Source 01/13/21 0143 Oral     SpO2 01/13/21 0143 95 %     Weight 01/13/21 0136 104 lb (47.2 kg)     Height 01/13/21 0136 5\' 8"  (1.727 m)     Head Circumference --      Peak Flow --      Pain Score 01/13/21 0136 8     Pain Loc --      Pain Edu? --      Excl. in GC? --     Constitutional: Alert and oriented. Well appearing and in no distress. Head: Normocephalic and atraumatic. Cardiovascular: Normal rate, regular rhythm. Normal distal pulses. Respiratory: Normal respiratory effort. No wheezes/rales/rhonchi. Gastrointestinal: Soft and nontender. No distention. Musculoskeletal: Nontender with normal range of motion in all extremities.  Neurologic:  Normal gait without ataxia. Normal speech and language. No gross focal neurologic deficits are appreciated. Skin:  Skin is warm, dry and intact.  Left foot with a plantar surface of wound at the first MTP without signs of infection.  When compared to the x-ray image, it appears that the foreign body is not in the same location as the foot wound.  The foreign body appears to be more distal, in the area of the base of the phalanges. Psychiatric: Mood and affect are normal. Patient exhibits appropriate insight and judgment. ____________________________________________    LABS (pertinent  positives/negatives)  ____________________________________________  EKG  ____________________________________________   RADIOLOGY Official radiology report(s): DG Foot Complete Left  Result Date: 01/13/2021 CLINICAL DATA:  Foot pain, retained glass within the foot EXAM: LEFT FOOT - COMPLETE 3+ VIEW COMPARISON:  None. FINDINGS: Normal alignment. No fracture or dislocation. There is a a small ulcer involving the a plantar aspect the foot subjacent to the first metatarsal head. Moderate soft tissue swelling within this region. A 5 mm shard like tiny radiopaque foreign body is seen subjacent to the a proximal phalanges on lateral examination only. This may represent a retained radiopaque foreign body or an object overlying the patient. IMPRESSION: Superficial soft tissue injury and soft tissue swelling subjacent to the first metatarsophalangeal joint. 5 mm shard like foreign body subjacent to the proximal phalanges on lateral examination only. This may represent a retained radiopaque foreign body or, as this is  not seen on frontal radiographs, simply an object overlying the patient on lateral examination. Removal of the surgical dressing and repeat imaging may be helpful in excluding this as a possibility. Electronically Signed   By: Helyn Numbers MD   On: 01/13/2021 02:08   ____________________________________________  PROCEDURES   Procedures ____________________________________________   INITIAL IMPRESSION / ASSESSMENT AND PLAN / ED COURSE  As part of my medical decision making, I reviewed the following data within the electronic MEDICAL RECORD NUMBER Radiograph reviewed as noted and Notes from prior ED visits   Patient ED evaluation and request for foreign body removal.  Patient does have a radiopaque foreign body confirmed on x-ray.  The foreign body sliver however, is not in the bed of the foot wound the patient presented with.  It appears to be somewhere distal to that, but the patient is  unable to confirm location with palpation over the soft tissues at the base of the toes.  As such, I think is advised to attempt blind retrieval of a small radiopaque foreign body in this setting.  Patient is referred to the [facility] for further evaluation and management.  A photocopy of his lateral foot x-ray is provided for the benefit of his receiving provider.  Patient is disappointed, but does understand the deferral of treatment at this time.   Ian Herman was evaluated in Emergency Department on 01/13/2021 for the symptoms described in the history of present illness. He was evaluated in the context of the global COVID-19 pandemic, which necessitated consideration that the patient might be at risk for infection with the SARS-CoV-2 virus that causes COVID-19. Institutional protocols and algorithms that pertain to the evaluation of patients at risk for COVID-19 are in a state of rapid change based on information released by regulatory bodies including the CDC and federal and state organizations. These policies and algorithms were followed during the patient's care in the ED. ____________________________________________  FINAL CLINICAL IMPRESSION(S) / ED DIAGNOSES  Final diagnoses:  Foreign body (FB) in soft tissue      Kinzee Happel, Charlesetta Ivory, PA-C 01/13/21 1659    Merwyn Katos, MD 01/14/21 [phone]

## 2021-01-13 NOTE — ED Triage Notes (Signed)
PT arrived via POV with c/o foot pain related to glass in his foot, pt states it occurred 7/16 when he was walking to the ambulance   States he has glass stuck on the ball of his L foot.  Pt states he has been soaking it with no relief.

## 2021-03-22 ENCOUNTER — Emergency Department: Payer: No Typology Code available for payment source

## 2021-03-22 ENCOUNTER — Other Ambulatory Visit: Payer: Self-pay

## 2021-03-22 ENCOUNTER — Inpatient Hospital Stay
Admission: EM | Admit: 2021-03-22 | Discharge: 2021-03-24 | DRG: 190 | Disposition: A | Discharge status: Home / Self Care | Payer: No Typology Code available for payment source | Attending: Student | Admitting: Student

## 2021-03-22 ENCOUNTER — Inpatient Hospital Stay: Payer: No Typology Code available for payment source

## 2021-03-22 ENCOUNTER — Encounter: Payer: Self-pay | Admitting: Emergency Medicine

## 2021-03-22 DIAGNOSIS — Z9049 Acquired absence of other specified parts of digestive tract: Secondary | ICD-10-CM | POA: Diagnosis not present

## 2021-03-22 DIAGNOSIS — J441 Chronic obstructive pulmonary disease with (acute) exacerbation: Secondary | ICD-10-CM | POA: Diagnosis present

## 2021-03-22 DIAGNOSIS — R7989 Other specified abnormal findings of blood chemistry: Secondary | ICD-10-CM

## 2021-03-22 DIAGNOSIS — Z8616 Personal history of COVID-19: Secondary | ICD-10-CM

## 2021-03-22 DIAGNOSIS — I110 Hypertensive heart disease with heart failure: Secondary | ICD-10-CM | POA: Diagnosis present

## 2021-03-22 DIAGNOSIS — J9621 Acute and chronic respiratory failure with hypoxia: Secondary | ICD-10-CM | POA: Diagnosis present

## 2021-03-22 DIAGNOSIS — Z955 Presence of coronary angioplasty implant and graft: Secondary | ICD-10-CM

## 2021-03-22 DIAGNOSIS — Z79899 Other long term (current) drug therapy: Secondary | ICD-10-CM | POA: Diagnosis not present

## 2021-03-22 DIAGNOSIS — Z7951 Long term (current) use of inhaled steroids: Secondary | ICD-10-CM | POA: Diagnosis not present

## 2021-03-22 DIAGNOSIS — E43 Unspecified severe protein-calorie malnutrition: Secondary | ICD-10-CM | POA: Diagnosis present

## 2021-03-22 DIAGNOSIS — R1011 Right upper quadrant pain: Secondary | ICD-10-CM | POA: Diagnosis present

## 2021-03-22 DIAGNOSIS — F17210 Nicotine dependence, cigarettes, uncomplicated: Secondary | ICD-10-CM | POA: Diagnosis present

## 2021-03-22 DIAGNOSIS — Z681 Body mass index (BMI) 19 or less, adult: Secondary | ICD-10-CM | POA: Diagnosis not present

## 2021-03-22 DIAGNOSIS — F4310 Post-traumatic stress disorder, unspecified: Secondary | ICD-10-CM | POA: Diagnosis present

## 2021-03-22 DIAGNOSIS — F1410 Cocaine abuse, uncomplicated: Secondary | ICD-10-CM | POA: Diagnosis present

## 2021-03-22 DIAGNOSIS — I5032 Chronic diastolic (congestive) heart failure: Secondary | ICD-10-CM | POA: Diagnosis present

## 2021-03-22 DIAGNOSIS — I252 Old myocardial infarction: Secondary | ICD-10-CM | POA: Diagnosis not present

## 2021-03-22 DIAGNOSIS — K8020 Calculus of gallbladder without cholecystitis without obstruction: Secondary | ICD-10-CM | POA: Diagnosis present

## 2021-03-22 DIAGNOSIS — E785 Hyperlipidemia, unspecified: Secondary | ICD-10-CM

## 2021-03-22 DIAGNOSIS — K219 Gastro-esophageal reflux disease without esophagitis: Secondary | ICD-10-CM | POA: Diagnosis present

## 2021-03-22 DIAGNOSIS — K8000 Calculus of gallbladder with acute cholecystitis without obstruction: Secondary | ICD-10-CM

## 2021-03-22 DIAGNOSIS — I10 Essential (primary) hypertension: Secondary | ICD-10-CM | POA: Diagnosis present

## 2021-03-22 DIAGNOSIS — Z801 Family history of malignant neoplasm of trachea, bronchus and lung: Secondary | ICD-10-CM | POA: Diagnosis not present

## 2021-03-22 DIAGNOSIS — E782 Mixed hyperlipidemia: Secondary | ICD-10-CM | POA: Diagnosis present

## 2021-03-22 DIAGNOSIS — I2510 Atherosclerotic heart disease of native coronary artery without angina pectoris: Secondary | ICD-10-CM | POA: Diagnosis present

## 2021-03-22 DIAGNOSIS — Z7982 Long term (current) use of aspirin: Secondary | ICD-10-CM | POA: Diagnosis not present

## 2021-03-22 DIAGNOSIS — Z720 Tobacco use: Secondary | ICD-10-CM | POA: Diagnosis present

## 2021-03-22 DIAGNOSIS — R079 Chest pain, unspecified: Secondary | ICD-10-CM | POA: Insufficient documentation

## 2021-03-22 LAB — URINALYSIS, COMPLETE (UACMP) WITH MICROSCOPIC
Bacteria, UA: NONE SEEN
Bilirubin Urine: NEGATIVE
Glucose, UA: 150 mg/dL — AB
Hgb urine dipstick: NEGATIVE
Ketones, ur: NEGATIVE mg/dL
Leukocytes,Ua: NEGATIVE
Nitrite: NEGATIVE
Protein, ur: NEGATIVE mg/dL
Specific Gravity, Urine: >1.046 — ABNORMAL HIGH (ref 1.005–1.030)
Squamous Epithelial / HPF: NONE SEEN (ref 0–5)
pH: 7 (ref 5.0–8.0)

## 2021-03-22 LAB — COMPREHENSIVE METABOLIC PANEL
ALT: 12 U/L (ref 0–44)
AST: 15 U/L (ref 15–41)
Albumin: 4.2 g/dL (ref 3.5–5.0)
Alkaline Phosphatase: 81 U/L (ref 38–126)
Anion gap: 12 (ref 5–15)
BUN: 21 mg/dL (ref 8–23)
CO2: 25 mmol/L (ref 22–32)
Calcium: 9.2 mg/dL (ref 8.9–10.3)
Chloride: 101 mmol/L (ref 98–111)
Creatinine, Ser: 0.78 mg/dL (ref 0.61–1.24)
GFR, Estimated: >60 mL/min (ref >60)
Glucose, Bld: 112 mg/dL — ABNORMAL HIGH (ref 70–99)
Potassium: 4.7 mmol/L (ref 3.5–5.1)
Sodium: 138 mmol/L (ref 135–145)
Total Bilirubin: 0.6 mg/dL (ref 0.3–1.2)
Total Protein: 7.5 g/dL (ref 6.5–8.1)

## 2021-03-22 LAB — RESP PANEL BY RT-PCR (FLU A&B, COVID) ARPGX2
Influenza A by PCR: NEGATIVE
Influenza B by PCR: NEGATIVE
SARS Coronavirus 2 by RT PCR: NEGATIVE

## 2021-03-22 LAB — URINE DRUG SCREEN, QUALITATIVE (ARMC ONLY)
Amphetamines, Ur Screen: NOT DETECTED
Barbiturates, Ur Screen: NOT DETECTED
Benzodiazepine, Ur Scrn: NOT DETECTED
Cannabinoid 50 Ng, Ur ~~LOC~~: POSITIVE — AB
Cocaine Metabolite,Ur ~~LOC~~: POSITIVE — AB
MDMA (Ecstasy)Ur Screen: NOT DETECTED
Methadone Scn, Ur: NOT DETECTED
Opiate, Ur Screen: POSITIVE — AB
Phencyclidine (PCP) Ur S: NOT DETECTED
Tricyclic, Ur Screen: NOT DETECTED

## 2021-03-22 LAB — TROPONIN I (HIGH SENSITIVITY)
Troponin I (High Sensitivity): 12 ng/L (ref <18)
Troponin I (High Sensitivity): 11 ng/L (ref <18)

## 2021-03-22 LAB — CBC
HCT: 41.7 % (ref 39.0–52.0)
Hemoglobin: 13.9 g/dL (ref 13.0–17.0)
MCH: 30.2 pg (ref 26.0–34.0)
MCHC: 33.3 g/dL (ref 30.0–36.0)
MCV: 90.5 fL (ref 80.0–100.0)
Platelets: 156 10*3/uL (ref 150–400)
RBC: 4.61 MIL/uL (ref 4.22–5.81)
RDW: 13.3 % (ref 11.5–15.5)
WBC: 5 10*3/uL (ref 4.0–10.5)
nRBC: 0 % (ref 0.0–0.2)

## 2021-03-22 LAB — D-DIMER, QUANTITATIVE: D-Dimer, Quant: 0.69 ug/mL-FEU — ABNORMAL HIGH (ref 0.00–0.50)

## 2021-03-22 LAB — LIPASE, BLOOD: Lipase: 29 U/L (ref 11–51)

## 2021-03-22 LAB — BRAIN NATRIURETIC PEPTIDE: B Natriuretic Peptide: 36 pg/mL (ref 0.0–100.0)

## 2021-03-22 MED ORDER — IPRATROPIUM-ALBUTEROL 0.5-2.5 (3) MG/3ML IN SOLN
3.0000 mL | RESPIRATORY_TRACT | Status: DC
  Administered 2021-03-22 – 2021-03-24 (×9): 3 mL via RESPIRATORY_TRACT
  Filled 2021-03-22 (×10): qty 3

## 2021-03-22 MED ORDER — NICOTINE 21 MG/24HR TD PT24
21.0000 mg | MEDICATED_PATCH | Freq: Every day | TRANSDERMAL | Status: DC
  Administered 2021-03-22 – 2021-03-24 (×3): 21 mg via TRANSDERMAL
  Filled 2021-03-22 (×3): qty 1

## 2021-03-22 MED ORDER — MORPHINE SULFATE (PF) 2 MG/ML IV SOLN
2.0000 mg | Freq: Once | INTRAVENOUS | Status: DC
  Filled 2021-03-22 (×2): qty 1

## 2021-03-22 MED ORDER — AZITHROMYCIN 500 MG PO TABS
250.0000 mg | ORAL_TABLET | Freq: Every day | ORAL | Status: DC
  Administered 2021-03-23 – 2021-03-24 (×2): 250 mg via ORAL
  Filled 2021-03-22 (×2): qty 1

## 2021-03-22 MED ORDER — FENTANYL CITRATE PF 50 MCG/ML IJ SOSY
12.5000 ug | PREFILLED_SYRINGE | INTRAMUSCULAR | Status: DC | PRN
Start: 2021-03-22 — End: 2021-03-24
  Administered 2021-03-22 – 2021-03-24 (×7): 12.5 ug via INTRAVENOUS
  Filled 2021-03-22 (×7): qty 1

## 2021-03-22 MED ORDER — ENSURE ENLIVE PO LIQD
237.0000 mL | Freq: Two times a day (BID) | ORAL | Status: DC
  Administered 2021-03-23 – 2021-03-24 (×4): 237 mL via ORAL

## 2021-03-22 MED ORDER — DIPHENHYDRAMINE HCL 25 MG PO CAPS: 25.0000 mg | ORAL_CAPSULE | Freq: Four times a day (QID) | ORAL | Status: DC | PRN

## 2021-03-22 MED ORDER — METHYLPREDNISOLONE SODIUM SUCC 40 MG IJ SOLR
40.0000 mg | Freq: Two times a day (BID) | INTRAMUSCULAR | Status: DC
  Administered 2021-03-22 – 2021-03-24 (×4): 40 mg via INTRAVENOUS
  Filled 2021-03-22 (×4): qty 1

## 2021-03-22 MED ORDER — IPRATROPIUM-ALBUTEROL 0.5-2.5 (3) MG/3ML IN SOLN
3.0000 mL | Freq: Once | RESPIRATORY_TRACT | Status: DC
  Administered 2021-03-22: 3 mL via RESPIRATORY_TRACT
  Filled 2021-03-22 (×2): qty 3

## 2021-03-22 MED ORDER — ASPIRIN 81 MG PO CHEW
324.0000 mg | CHEWABLE_TABLET | Freq: Once | ORAL | Status: AC
  Administered 2021-03-22: 324 mg via ORAL
  Filled 2021-03-22: qty 4

## 2021-03-22 MED ORDER — MORPHINE SULFATE (PF) 4 MG/ML IV SOLN
4.0000 mg | Freq: Once | INTRAVENOUS | Status: AC
  Administered 2021-03-22: 4 mg via INTRAVENOUS
  Filled 2021-03-22: qty 1

## 2021-03-22 MED ORDER — DM-GUAIFENESIN ER 30-600 MG PO TB12
1.0000 | ORAL_TABLET | Freq: Two times a day (BID) | ORAL | Status: DC | PRN
  Administered 2021-03-23: 1 via ORAL
  Filled 2021-03-22: qty 1

## 2021-03-22 MED ORDER — ALBUTEROL SULFATE (2.5 MG/3ML) 0.083% IN NEBU: 2.5000 mg | INHALATION_SOLUTION | RESPIRATORY_TRACT | Status: DC | PRN

## 2021-03-22 MED ORDER — ACETAMINOPHEN 325 MG PO TABS: 650.0000 mg | ORAL_TABLET | Freq: Four times a day (QID) | ORAL | Status: DC | PRN

## 2021-03-22 MED ORDER — AZITHROMYCIN 500 MG PO TABS
500.0000 mg | ORAL_TABLET | Freq: Every day | ORAL | Status: AC
  Administered 2021-03-22: 500 mg via ORAL
  Filled 2021-03-22: qty 1

## 2021-03-22 MED ORDER — IPRATROPIUM-ALBUTEROL 0.5-2.5 (3) MG/3ML IN SOLN
6.0000 mL | Freq: Once | RESPIRATORY_TRACT | Status: AC
  Administered 2021-03-22: 6 mL via RESPIRATORY_TRACT
  Filled 2021-03-22: qty 6

## 2021-03-22 MED ORDER — METHYLPREDNISOLONE SODIUM SUCC 125 MG IJ SOLR
125.0000 mg | Freq: Once | INTRAMUSCULAR | Status: AC
  Administered 2021-03-22: 125 mg via INTRAVENOUS
  Filled 2021-03-22: qty 2

## 2021-03-22 MED ORDER — IOHEXOL 350 MG/ML SOLN
75.0000 mL | Freq: Once | INTRAVENOUS | Status: AC | PRN
  Administered 2021-03-22: 75 mL via INTRAVENOUS

## 2021-03-22 MED ORDER — ONDANSETRON HCL 4 MG/2ML IJ SOLN
4.0000 mg | Freq: Once | INTRAMUSCULAR | Status: AC
  Administered 2021-03-22: 4 mg via INTRAVENOUS
  Filled 2021-03-22: qty 2

## 2021-03-22 MED ORDER — HYDRALAZINE HCL 20 MG/ML IJ SOLN: 5.0000 mg | INTRAMUSCULAR | Status: DC | PRN

## 2021-03-22 NOTE — ED Notes (Addendum)
Pt via POV from home. Pt c/o RUQ pain since this AM pt states it has been going on for about 2.5 hours. Denies NVD but states that he does have a hx of gallstones. Pt still does have gallbladder at this time. Pt has a hx of an appendectomy around 40 years ago. Pt is hypertensive on arrival, pt states he hasn't taken any of his BP medications this AM.  Pt is A&Ox4 and NAD.

## 2021-03-22 NOTE — Consult Note (Signed)
[facility] SURGICAL CONSULTATION NOTE (initial) - cpt: 84696  HISTORY OF PRESENT ILLNESS (HPI):  75 y.o. male presented to [facility] ED today for evaluation of abdominal pain and SOB. Patient reports around 5-6 hours ago his noticed the acute onset of abdominal pain. This was sharp and located in the RUQ. He noted at first even taking a deep breath exacerbated the pain. No fever, chills, nausea, emesis, bowel changes, urinary changes, or juandice. He denied any history of similar pain but was diagnosed with cholelithiasis a few years ago through [place]. He never saw a Careers adviser for this. Only previous intra-abdominal surgery appears to appendectomy. He does have a history of significant respiratory disease. Work up in the ED revealed a benign laboratory work up with normal WBC to 5.0K, normal renal function to 0.78, LFTs are normal, and lipase was also within normal limits. He did have RUQ [place] which showed known gallstones but no significant changes to suggest overt cholecystitis.   Surgery is consulted by emergency medicine physician Dr. Delton Prairie, MD in this context for evaluation and management of cholelithiasis.  PAST MEDICAL HISTORY (PMH):  Past Medical History:  Diagnosis Date  . Arthritis   . Bronchitis   . CHF (congestive heart failure) (HCC)   . COPD (chronic obstructive pulmonary disease) (HCC)   . Coronary artery disease   . Depression   . GERD (gastroesophageal reflux disease)   . Hepatitis C   . Hypertension   . Myocardial infarction acute (HCC)   . Pleural effusion   . PTSD (post-traumatic stress disorder)   . Shortness of breath dyspnea      PAST SURGICAL HISTORY (PSH):  Past Surgical History:  Procedure Laterality Date  . APPENDECTOMY    . CORONARY ANGIOPLASTY     stent  . INGUINAL HERNIA REPAIR Left 04/23/2015   Procedure: HERNIA REPAIR INGUINAL ADULT;  Surgeon: Nadeen Landau, MD;  Location: [facility] ORS;  Service: General;  Laterality: Left;      MEDICATIONS:  Prior to Admission medications   Medication Sig Start Date End Date Taking? Authorizing Provider  aspirin EC 81 MG tablet Take 81 mg by mouth daily.    [provider]  carboxymethylcellulose (REFRESH PLUS) 0.5 % SOLN Apply 1 drop to eye 3 (three) times daily as needed (dry eyes).    [provider]  fluticasone-salmeterol (ADVAIR) 250-50 MCG/ACT AEPB Inhale 1 puff into the lungs in the morning and at bedtime. 01/11/20   [provider]  Ipratropium-Albuterol (COMBIVENT) 20-100 MCG/ACT AERS respimat Inhale 1 puff into the lungs every 6 (six) hours as needed for wheezing or shortness of breath.    [provider]  ipratropium-albuterol (DUONEB) 0.5-2.5 (3) MG/3ML SOLN Take 3 mLs by nebulization every 6 (six) hours for 3 days. 08/07/18 12/25/20  Auburn Bilberry, MD  metoprolol tartrate (LOPRESSOR) 50 MG tablet Take 1 tablet (50 mg total) by mouth 2 (two) times daily. 12/29/20   Tyrone Nine, MD  Nutritional Supplements (ENSURE PLUS PO) Take 1 Can by mouth See admin instructions. Consume contents of 1 can every day separate from meals. Do not use as meal replacement. 01/27/20   [provider]  sildenafil (VIAGRA) 100 MG tablet Take 100 mg by mouth as needed for erectile dysfunction.    [provider]  simvastatin (ZOCOR) 20 MG tablet Take 20 mg by mouth every evening.    [provider]     ALLERGIES:  Allergies  Allergen Reactions  . Trazodone  Other reaction(s): OTHER REACTION, dystonia, OTHER REACTION, dystonia  . Chlorpromazine Hives, Anxiety and Rash    Hives  Other reaction(s): Anxiety, AGITATION, Cramp, MUSCLE SPASMS, OTHER REACTION, PARKINSONIAN-LIKE SYNDROME, UNKNOWN  . Codeine Itching and Rash    itching Other reaction(s): HIVES, SPASISMS, UNKNOWN     SOCIAL HISTORY:  Social History   Socioeconomic History  . Marital status: Widowed    Spouse name: Not on file  . Number of children: Not on file   . Years of education: Not on file  . Highest education level: Not on file  Occupational History  . Not on file  Tobacco Use  . Smoking status: Every Day    Packs/day: 0.50    Types: Cigarettes  . Smokeless tobacco: Never  Substance and Sexual Activity  . Alcohol use: Yes    Comment: occasionally per patient  . Drug use: No    Comment: IV drug use history mult. years ago.  Marland Kitchen Sexual activity: Not on file  Other Topics Concern  . Not on file  Social History Narrative  . Not on file   Social Determinants of Health   Financial Resource Strain: Not on file  Food Insecurity: Not on file  Transportation Needs: Not on file  Physical Activity: Not on file  Stress: Not on file  Social Connections: Not on file  Intimate Partner Violence: Not on file     FAMILY HISTORY:  History reviewed. No pertinent family history.    REVIEW OF SYSTEMS:  Review of Systems  Constitutional:  Negative for chills and fever.  HENT:  Negative for congestion and sore throat.   Respiratory:  Positive for shortness of breath. Negative for cough.   Cardiovascular:  Negative for chest pain.  Gastrointestinal:  Positive for abdominal pain. Negative for constipation, diarrhea, nausea and vomiting.  Genitourinary:  Negative for dysuria and urgency.  All other systems reviewed and are negative.  VITAL SIGNS:  Temp:  [97.8 F (36.6 C)] 97.8 F (36.6 C) (10/11 0833) Pulse Rate:  [69-74] 69 (10/11 0930) Resp:  [20] 20 (10/11 0930) BP: (180-184)/(91-97) 180/91 (10/11 0930) SpO2:  [86 %-100 %] 100 % (10/11 0930) Weight:  [68 kg] 68 kg (10/11 0832)     Height: 5\' 8"  (172.7 cm) Weight: 68 kg BMI (Calculated): 22.81   INTAKE/OUTPUT:  No intake/output data recorded.  PHYSICAL EXAM:  Physical Exam Vitals and nursing note reviewed.  Constitutional:      General: He is not in acute distress.    Appearance: He is well-developed. He is not ill-appearing.  HENT:     Head: Normocephalic and atraumatic.   Eyes:     General: No scleral icterus.    Extraocular Movements: Extraocular movements intact.  Cardiovascular:     Rate and Rhythm: Normal rate.     Heart sounds: Normal heart sounds. No murmur heard. Pulmonary:     Effort: Pulmonary effort is normal. No respiratory distress.     Breath sounds: Normal breath sounds.     Comments: On [place] Abdominal:     General: A surgical scar is present. There is no distension.     Palpations: Abdomen is soft.     Tenderness: There is abdominal tenderness in the right upper quadrant. There is no guarding or rebound. Negative signs include Murphy's sign.  Genitourinary:    Comments: Deferred Skin:    General: Skin is warm and dry.     Coloration: Skin is not jaundiced.  Neurological:     General:  No focal deficit present.     Mental Status: He is alert and oriented to person, place, and time.  Psychiatric:        Mood and Affect: Mood normal.        Behavior: Behavior normal.     Labs:  CBC Latest Ref Rng & Units 03/22/2021 12/29/2020 12/27/2020  WBC 4.0 - 10.5 K/uL 5.0 6.0 9.6  Hemoglobin 13.0 - 17.0 g/dL 70.3 50.0 12.4(L)  Hematocrit 39.0 - 52.0 % 41.7 38.7(L) 35.7(L)  Platelets 150 - 400 K/uL 156 142(L) 131(L)   CMP Latest Ref Rng & Units 03/22/2021 12/29/2020 12/27/2020  Glucose 70 - 99 mg/dL 938(H) 829(H) 371(I)  BUN 8 - 23 mg/dL 21 96(V) 89(F)  Creatinine 0.61 - 1.24 mg/dL 8.10 1.75 1.02  Sodium 135 - 145 mmol/L 138 132(L) 134(L)  Potassium 3.5 - 5.1 mmol/L 4.7 4.3 3.7  Chloride 98 - 111 mmol/L 101 98 99  CO2 22 - 32 mmol/L 25 30 26   Calcium 8.9 - 10.3 mg/dL 9.2 ) 8.3(L)  Total Protein 6.5 - 8.1 g/dL 7.5 6.3(L) 6.2(L)  Total Bilirubin 0.3 - 1.2 mg/dL 0.6 0.5 0.5  Alkaline Phos 38 - 126 U/L 81 54 46  AST 15 - 41 U/L 15 38 20  ALT 0 - 44 U/L 12 38 14     Imaging studies:   RUQ 5.8(N (03/22/2021) personally reviewed with relatively contracted GB with large gallstones, but no gross gallbladder wall changes or surrounding fluid  to suggest overt cholecystitis, and radiologist report reviewed:  IMPRESSION: Cholelithiasis. Slight gallbladder wall thickening could reflect early cholecystitis. Recommend clinical correlation.    Assessment/Plan: (ICD-10's: K55.20) 75 y.o. male with likely symptomatic cholelithiasis without evidence of cholecystitis, complicated by pertinent comorbidities including of significant cardiopulmonary changes.   - Agree with medicine admission given cardiopulmonary disease - I do not think he has evidence of cholecystitis and symptoms rather represent symptomatic cholelithiasis potentially vs exacerbation of cardiopulmonary disease. He may warrant outpatient evaluation of interval cholecystectomy in the future should he be optimized from a medical standpoint.   - No need for IV Abx - Okay to start CLD if no other interventions planned from other services - Pain control prn; antiemetics prn - Monitor abdominal examination   - Further management per primary service; we will follow   All of the above findings and recommendations were discussed with the patient, and all of patient's questions were answered to his expressed satisfaction.  Thank you for the opportunity to participate in this patient's care.   -- 66, PA-C [facility] 03/22/2021, 10:37 AM [phone] M-F: 7am - 4pm

## 2021-03-22 NOTE — ED Notes (Signed)
Dr. Smith, EDP at bedside 

## 2021-03-22 NOTE — H&P (Signed)
History and Physical    Ian Herman AYT:[id number] DOB: 1946/05/30 DOA: 03/22/2021  Referring MD/NP/PA:   PCP: Louanne Skye, MD   Patient coming from:  The patient is coming from home.  At baseline, pt is independent for most of ADL.        Chief Complaint: RUQ and SOB  HPI: Ian Herman is a 75 y.o. male with medical history significant of gallstone, hypertension, hyperlipidemia, COPD not on oxygen, GERD, depression with anxiety, PTSD, CAD, stent placement, pleural effusion, HCV, dCHF, tobacco abuse, COVID infection 2 months ago, who presents with right upper quadrant abdominal pain and shortness of breath.  Patient states that his abdominal pain started today, which is located in the right upper quadrant, constant, moderate, sharp, nonradiating.  Patient denies nausea vomiting, diarrhea.  Patient feels warm, but his body temperature 97.8 in ED.  No chills.  Patient denies chest pain.  He states that he has shortness of breath and cough with yellow-colored sputum production.  No symptoms of UTI.  ED Course: pt was found to have positive D-dimer, WBC 5.0, negative COVID PCR, troponin level 12, 11, electrolytes renal function okay, temperature normal, blood pressure 141/77, heart rate 84, RR 20, oxygen saturation 86% on room air, which improved to 99% on 2 L oxygen.  Chest x-ray negative.  Patient is admitted to progressive bed as inpatient.  RUQ-US showed: Cholelithiasis. Slight gallbladder wall thickening could reflect early cholecystitis.   Review of Systems:   General: no fevers, chills, no body weight gain, has poor appetite, has fatigue HEENT: no blurry vision, hearing changes or sore throat Respiratory: has dyspnea, coughing, wheezing CV: no chest pain, no palpitations GI: no nausea, vomiting, has RUQ abdominal pain,  no diarrhea, constipation GU: no dysuria, burning on urination, increased urinary frequency, hematuria  Ext: no leg edema Neuro: no unilateral  weakness, numbness, or tingling, no vision change or hearing loss Skin: no rash, no skin tear. MSK: No muscle spasm, no deformity, no limitation of range of movement in spin Heme: No easy bruising.  Travel history: No recent long distant travel.  Allergy:  Allergies  Allergen Reactions   Trazodone     Other reaction(s): OTHER REACTION, dystonia, OTHER REACTION, dystonia   Chlorpromazine Hives, Anxiety and Rash    Hives  Other reaction(s): Anxiety, AGITATION, Cramp, MUSCLE SPASMS, OTHER REACTION, PARKINSONIAN-LIKE SYNDROME, UNKNOWN   Codeine Itching and Rash    itching Other reaction(s): HIVES, SPASISMS, UNKNOWN    Past Medical History:  Diagnosis Date   Arthritis    Bronchitis    CHF (congestive heart failure) (HCC)    COPD (chronic obstructive pulmonary disease) (HCC)    Coronary artery disease    Depression    GERD (gastroesophageal reflux disease)    Hepatitis C    Hypertension    Myocardial infarction acute (HCC)    Pleural effusion    PTSD (post-traumatic stress disorder)    Shortness of breath dyspnea     Past Surgical History:  Procedure Laterality Date   APPENDECTOMY     CORONARY ANGIOPLASTY     stent   INGUINAL HERNIA REPAIR Left 04/23/2015   Procedure: HERNIA REPAIR INGUINAL ADULT;  Surgeon: Nadeen Landau, MD;  Location: [facility] ORS;  Service: General;  Laterality: Left;    Social History:  reports that he has been smoking. He has been smoking an average of .5 packs per day. He has never used smokeless tobacco. He reports current alcohol use. He reports that  he does not use drugs.  Family History:  Family History  Problem Relation Age of Onset   Lung cancer Sister      Prior to Admission medications   Medication Sig Start Date End Date Taking? Authorizing Provider  aspirin EC 81 MG tablet Take 81 mg by mouth daily.    [provider]  carboxymethylcellulose (REFRESH PLUS) 0.5 % SOLN Apply 1 drop to eye 3 (three) times daily as needed (dry  eyes).    [provider]  fluticasone-salmeterol (ADVAIR) 250-50 MCG/ACT AEPB Inhale 1 puff into the lungs in the morning and at bedtime. 01/11/20   [provider]  Ipratropium-Albuterol (COMBIVENT) 20-100 MCG/ACT AERS respimat Inhale 1 puff into the lungs every 6 (six) hours as needed for wheezing or shortness of breath.    [provider]  ipratropium-albuterol (DUONEB) 0.5-2.5 (3) MG/3ML SOLN Take 3 mLs by nebulization every 6 (six) hours for 3 days. 08/07/18 12/25/20  Auburn Bilberry, MD  metoprolol tartrate (LOPRESSOR) 50 MG tablet Take 1 tablet (50 mg total) by mouth 2 (two) times daily. 12/29/20   Tyrone Nine, MD  Nutritional Supplements (ENSURE PLUS PO) Take 1 Can by mouth See admin instructions. Consume contents of 1 can every day separate from meals. Do not use as meal replacement. 01/27/20   [provider]  sildenafil (VIAGRA) 100 MG tablet Take 100 mg by mouth as needed for erectile dysfunction.    [provider]  simvastatin (ZOCOR) 20 MG tablet Take 20 mg by mouth every evening.    [provider]    Physical Exam: Vitals:   03/22/21 0930 03/22/21 1114 03/22/21 1244 03/22/21 1456  BP: (!) 180/91 (!) 141/77 139/72 (!) 141/75  Pulse: 69 84 77 73  Resp: 20 19 17 16   Temp:      TempSrc:      SpO2: 100% 99% 96% 100%  Weight:      Height:       General: Not in acute distress, very thin body habitus HEENT:       Eyes: PERRL, EOMI, no scleral icterus.       ENT: No discharge from the ears and nose, no pharynx injection, no tonsillar enlargement.        Neck: No JVD, no bruit, no mass felt. Heme: No neck lymph node enlargement. Cardiac: S1/S2, RRR, No murmurs, No gallops or rubs. Respiratory: Has decreased air movement bilaterally, particularly on the left side, with mild wheezing bilaterally GI: Soft, nondistended, has RUQ tenderness, no rebound pain, no organomegaly, BS present. GU: No hematuria Ext: No pitting leg edema  bilaterally. 1+DP/PT pulse bilaterally. Musculoskeletal: No joint deformities, No joint redness or warmth, no limitation of ROM in spin. Skin: No rashes.  Neuro: Alert, oriented X3, cranial nerves II-XII grossly intact, moves all extremities normally.  Psych: Patient is not psychotic, no suicidal or hemocidal ideation.  Labs on Admission: I have personally reviewed following labs and imaging studies  CBC: Recent Labs  Lab 03/22/21 0834  WBC 5.0  HGB 13.9  HCT 41.7  MCV 90.5  PLT 156   Basic Metabolic Panel: Recent Labs  Lab 03/22/21 0834  NA 138  K 4.7  CL 101  CO2 25  GLUCOSE 112*  BUN 21  CREATININE 0.78  CALCIUM 9.2   GFR: Estimated Creatinine Clearance: 79.1 mL/min (by C-G formula based on SCr of 0.78 mg/dL). Liver Function Tests: Recent Labs  Lab 03/22/21 0834  AST 15  ALT 12  ALKPHOS  81  BILITOT 0.6  PROT 7.5  ALBUMIN 4.2   Recent Labs  Lab 03/22/21 0834  LIPASE 29   No results for input(s): AMMONIA in the last 168 hours. Coagulation Profile: No results for input(s): INR, PROTIME in the last 168 hours. Cardiac Enzymes: No results for input(s): CKTOTAL, CKMB, CKMBINDEX, TROPONINI in the last 168 hours. BNP (last 3 results) No results for input(s): PROBNP in the last 8760 hours. HbA1C: No results for input(s): HGBA1C in the last 72 hours. CBG: No results for input(s): GLUCAP in the last 168 hours. Lipid Profile: No results for input(s): CHOL, HDL, LDLCALC, TRIG, CHOLHDL, LDLDIRECT in the last 72 hours. Thyroid Function Tests: No results for input(s): TSH, T4TOTAL, FREET4, T3FREE, THYROIDAB in the last 72 hours. Anemia Panel: No results for input(s): VITAMINB12, FOLATE, FERRITIN, TIBC, IRON, RETICCTPCT in the last 72 hours. Urine analysis:    Component Value Date/Time   COLORURINE YELLOW (A) 12/25/2020 1215   APPEARANCEUR CLEAR (A) 12/25/2020 1215   APPEARANCEUR Clear 03/15/2013 1642   LABSPEC 1.018 12/25/2020 1215   LABSPEC 1.016 03/15/2013  1642   PHURINE 6.0 12/25/2020 1215   GLUCOSEU NEGATIVE 12/25/2020 1215   GLUCOSEU Negative 03/15/2013 1642   HGBUR MODERATE (A) 12/25/2020 1215   BILIRUBINUR NEGATIVE 12/25/2020 1215   BILIRUBINUR Negative 03/15/2013 1642   KETONESUR NEGATIVE 12/25/2020 1215   PROTEINUR 30 (A) 12/25/2020 1215   NITRITE NEGATIVE 12/25/2020 1215   LEUKOCYTESUR NEGATIVE 12/25/2020 1215   LEUKOCYTESUR 1+ 03/15/2013 1642   Sepsis Labs: @LABRCNTIP (procalcitonin:4,lacticidven:4) ) Recent Results (from the past 240 hour(s))  Resp Panel by RT-PCR (Flu A&B, Covid) Nasopharyngeal Swab     Status: None   Collection Time: 03/22/21  8:35 AM   Specimen: Nasopharyngeal Swab; Nasopharyngeal(NP) swabs in vial transport medium  Result Value Ref Range Status   SARS Coronavirus 2 by RT PCR NEGATIVE NEGATIVE Final    Comment: (NOTE) SARS-CoV-2 target nucleic acids are NOT DETECTED.  The SARS-CoV-2 RNA is generally detectable in upper respiratory specimens during the acute phase of infection. The lowest concentration of SARS-CoV-2 viral copies this assay can detect is 138 copies/mL. A negative result does not preclude SARS-Cov-2 infection and should not be used as the sole basis for treatment or other patient management decisions. A negative result may occur with  improper specimen collection/handling, submission of specimen other than nasopharyngeal swab, presence of viral mutation(s) within the areas targeted by this assay, and inadequate number of viral copies(<138 copies/mL). A negative result must be combined with clinical observations, patient history, and epidemiological information. The expected result is Negative.  Fact Sheet for Patients:  BloggerCourse.com  Fact Sheet for Healthcare Providers:  SeriousBroker.it  This test is no t yet approved or cleared by the [place] FDA and  has been authorized for detection and/or diagnosis of SARS-CoV-2  by FDA under an Emergency Use Authorization (EUA). This EUA will remain  in effect (meaning this test can be used) for the duration of the COVID-19 declaration under Section 564(b)(1) of the Act, 21 U.S.C.section 360bbb-3(b)(1), unless the authorization is terminated  or revoked sooner.       Influenza A by PCR NEGATIVE NEGATIVE Final   Influenza B by PCR NEGATIVE NEGATIVE Final    Comment: (NOTE) The Xpert Xpress SARS-CoV-2/FLU/RSV plus assay is intended as an aid in the diagnosis of influenza from Nasopharyngeal swab specimens and should not be used as a sole basis for treatment. Nasal washings and aspirates are unacceptable for Xpert Xpress SARS-CoV-2/FLU/RSV testing.  Fact Sheet for Patients: BloggerCourse.com  Fact Sheet for Healthcare Providers: SeriousBroker.it  This test is not yet approved or cleared by the [place] FDA and has been authorized for detection and/or diagnosis of SARS-CoV-2 by FDA under an Emergency Use Authorization (EUA). This EUA will remain in effect (meaning this test can be used) for the duration of the COVID-19 declaration under Section 564(b)(1) of the Act, 21 U.S.C. section 360bbb-3(b)(1), unless the authorization is terminated or revoked.  Performed at [facility], [address] [place], [place] [address]      Radiological Exams on Admission: DG Chest Portable 1 View  Result Date: 03/22/2021 CLINICAL DATA:  Upper abdominal pain, hypoxia EXAM: PORTABLE CHEST 1 VIEW COMPARISON:  12/25/2020 FINDINGS: Heart is normal size. Aortic atherosclerosis. Hyperinflation. No confluent opacities or effusions. No acute bony abnormality. IMPRESSION: No active disease. Electronically Signed   By: Charlett Nose M.D.   On: 03/22/2021 09:24   US ABDOMEN LIMITED RUQ (LIVER/GB)  Result Date: 03/22/2021 CLINICAL DATA:  Mid abdominal pain, right upper quadrant pain. EXAM: ULTRASOUND ABDOMEN LIMITED  RIGHT UPPER QUADRANT COMPARISON:  None. FINDINGS: Gallbladder: At least 3 large stones measuring up 2 1.7 cm. Gallbladder wall is slightly thickened at 4 mm. Negative sonographic Murphy's. Common bile duct: Diameter: Normal caliber, 5 mm Liver: No focal lesion identified. Within normal limits in parenchymal echogenicity. Portal vein is patent on color Doppler imaging with normal direction of blood flow towards the liver. Other: None. IMPRESSION: Cholelithiasis. Slight gallbladder wall thickening could reflect early cholecystitis. Recommend clinical correlation. Electronically Signed   By: Charlett Nose M.D.   On: 03/22/2021 09:52     EKG: I have personally reviewed.  Sinus rhythm, QTC 515, LAD, mild ST depression in inferior leads and V4-V6, anteroseptal infarction pattern.  Assessment/Plan Principal Problem:   COPD exacerbation (HCC) Active Problems:   Protein-calorie malnutrition, severe   Acute on chronic respiratory failure with hypoxia (HCC)   Coronary artery disease   Hypertension   HLD (hyperlipidemia)   Tobacco abuse   Chronic diastolic CHF (congestive heart failure) (HCC)   RUQ abdominal pain   Gallstone  Acute on chronic respiratory failure with hypoxia due to COPD exacerbation [facility]): Patient has productive cough, shortness of breath, wheezing on auscultation, clinically consistent with COPD exacerbation.  D-dimer +0.69, given recent COVID infection, will need to rule out PE.  -will admit to progressive unit as inpatient -Bronchodilators -Solu-Medrol 40 mg IV bid -Z pak  -Mucinex for cough  -Incentive spirometry - sputum culture -Nasal cannula oxygen as needed to maintain O2 saturation 93% or greater - f/u CTA to r/o PE and LE doppler to r/o DVT.  RUQ abdominal pain and gallstone: Dr. [name] of general surgery is consulted. They do not think patient has evidence of cholecystitis.  "He may warrant outpatient evaluation of interval cholecystectomy in the future should he be  optimized from a medical standpoint" per surgery -As needed fentanyl and Zofran for supportive care  Protein-calorie malnutrition, severe:  -Ensure  Hx of coronary artery disease: Patient denies chest pain -Aspirin, Zocor  Hypertension -IV hydralazine as needed -Metoprolol, oral hydralazine, lisinopril  HLD (hyperlipidemia) -Zocor  Tobacco abuse -Nicotine patch -Did counseling about importance of quitting smoking  Chronic diastolic CHF (congestive heart failure) (HCC): 2D echo on 04/22/2015 showed EF> 55%.  Patient does not have leg edema.  No JVD.  BNP normal 36.  CHF is compensated. -Watch volume status closely   DVT ppx: SQ Heparin  Code Status: Full code Family Communication: Yes, patient's friend by phone Disposition Plan:  Anticipate discharge back to previous environment Consults called:  Dr. Crist Fat of general surgery Admission status and Level of care: Progressive Cardiac:    as inpt       Status is: Inpatient  Remains inpatient appropriate because:Inpatient level of care appropriate due to severity of illness  Dispo: The patient is from: Home              Anticipated d/c is to: Home              Patient currently is not medically stable to d/c.   Difficult to place patient No          Date of Service 03/22/2021    Lorretta Harp [facility]   If 7PM-7AM, please contact night-coverage [email or web address] 03/22/2021, 3:06 PM

## 2021-03-22 NOTE — ED Triage Notes (Addendum)
Pt via EMS from home. Pt c/o RUQ pain since approx 2 hours ago. Denies NVD. Denies fever. Pt does endorse SOB, pt is a daily smoker, and has a hx of COPD, pt recently had COVID 1 month ago. Pt has a hx of gallstones and pt still has gallbladder. Pt does have a hx of an appendectomy. Pt is A&Ox4 and NAD. Pt O2 86% on RA, pt placed on 2L [place].

## 2021-03-22 NOTE — ED Provider Notes (Signed)
[facility] Emergency Department Provider Note ____________________________________________   Event Date/Time   First MD Initiated Contact with Patient 03/22/21 0830     (approximate)  I have reviewed the triage vital signs and the nursing notes.  HISTORY  Chief Complaint Abdominal Pain   HPI Ian Herman is a 75 y.o. malewho presents to the ED for evaluation of ruq pain.  Chart review indicates history of CAD, COPD, CHF, GERD.  Patient presents to the ED for evaluation of about 5 hours of worsening RUQ abdominal/right basilar chest pain.  Reports sharp pain that has been constant and up to 8/10 intensity.  Denies associated nausea, shortness of breath, cough, emesis or diarrhea.  Past Medical History:  Diagnosis Date   Arthritis    Bronchitis    CHF (congestive heart failure) (HCC)    COPD (chronic obstructive pulmonary disease) (HCC)    Coronary artery disease    Depression    GERD (gastroesophageal reflux disease)    Hepatitis C    Hypertension    Myocardial infarction acute (HCC)    Pleural effusion    PTSD (post-traumatic stress disorder)    Shortness of breath dyspnea     Patient Active Problem List   Diagnosis Date Noted   RUQ abdominal pain 03/22/2021   Chest pain 03/22/2021   Acute on chronic respiratory failure with hypoxia (HCC) 12/25/2020   Coronary artery disease    Depression    Hypertension    HLD (hyperlipidemia)    CAP (community acquired pneumonia)    Severe sepsis (HCC)    Tobacco abuse    Chronic diastolic CHF (congestive heart failure) (HCC)    COPD exacerbation (HCC)    Anxiety state    Advanced care planning/counseling discussion    Goals of care, counseling/discussion    Palliative care by specialist    Acute on chronic respiratory failure with hypoxia and hypercapnia (HCC) 08/02/2018   Protein-calorie malnutrition, severe 08/02/2018    Past Surgical History:  Procedure Laterality Date   APPENDECTOMY      CORONARY ANGIOPLASTY     stent   INGUINAL HERNIA REPAIR Left 04/23/2015   Procedure: HERNIA REPAIR INGUINAL ADULT;  Surgeon: Nadeen Landau, MD;  Location: [facility] ORS;  Service: General;  Laterality: Left;    Prior to Admission medications   Medication Sig Start Date End Date Taking? Authorizing Provider  albuterol (ACCUNEB) 0.63 MG/3ML nebulizer solution Take 1 ampule by nebulization every 6 (six) hours as needed for wheezing.   Yes [provider]  aspirin EC 81 MG tablet Take 81 mg by mouth daily.   Yes [provider]  carboxymethylcellulose (REFRESH PLUS) 0.5 % SOLN Apply 1 drop to eye 3 (three) times daily as needed (dry eyes).   Yes [provider]  fluticasone-salmeterol (ADVAIR) 250-50 MCG/ACT AEPB Inhale 1 puff into the lungs in the morning and at bedtime. 01/11/20  Yes [provider]  guaiFENesin (MUCINEX) 600 MG 12 hr tablet Take 1 tablet by mouth daily. 01/28/21  Yes [provider]  hydrALAZINE (APRESOLINE) 50 MG tablet Take 1 tablet by mouth in the morning and at bedtime. 01/28/21  Yes [provider]  Ipratropium-Albuterol (COMBIVENT) 20-100 MCG/ACT AERS respimat Inhale 1 puff into the lungs every 6 (six) hours as needed for wheezing or shortness of breath.   Yes [provider]  lisinopril (ZESTRIL) 40 MG tablet Take 1 tablet by mouth daily. 01/28/21  Yes [provider]  meloxicam (MOBIC) 15  MG tablet Take 1 tablet by mouth daily. 01/28/21  Yes [provider]  metoprolol tartrate (LOPRESSOR) 50 MG tablet Take 1 tablet (50 mg total) by mouth 2 (two) times daily. 12/29/20  Yes Tyrone Nine, MD  omeprazole (PRILOSEC) 20 MG capsule Take 1 capsule by mouth daily. 01/28/21  Yes [provider]  sildenafil (VIAGRA) 100 MG tablet Take 100 mg by mouth as needed for erectile dysfunction.   Yes [provider]  simvastatin (ZOCOR) 20 MG tablet Take 20 mg by mouth every evening.   Yes  [provider]  tiZANidine (ZANAFLEX) 4 MG tablet Take 2 tablets by mouth at bedtime. 01/28/21  Yes [provider]  ipratropium-albuterol (DUONEB) 0.5-2.5 (3) MG/3ML SOLN Take 3 mLs by nebulization every 6 (six) hours for 3 days. 08/07/18 12/25/20  Auburn Bilberry, MD  Nutritional Supplements (ENSURE PLUS PO) Take 1 Can by mouth See admin instructions. Consume contents of 1 can every day separate from meals. Do not use as meal replacement. 01/27/20   [provider]    Allergies Trazodone, Chlorpromazine, and Codeine  History reviewed. No pertinent family history.  Social History Social History   Tobacco Use   Smoking status: Every Day    Packs/day: 0.50    Types: Cigarettes   Smokeless tobacco: Never  Substance Use Topics   Alcohol use: Yes    Comment: occasionally per patient   Drug use: No    Comment: IV drug use history mult. years ago.    Review of Systems  Constitutional: No fever/chills Eyes: No visual changes. ENT: No sore throat. Cardiovascular: Denies chest pain. Respiratory: Denies shortness of breath. Gastrointestinal: Positive for RUQ pain. No nausea, no vomiting.  No diarrhea.  No constipation. Genitourinary: Negative for dysuria. Musculoskeletal: Negative for back pain. Skin: Negative for rash. Neurological: Negative for headaches, focal weakness or numbness.  ____________________________________________   PHYSICAL EXAM:  VITAL SIGNS: Vitals:   03/22/21 0930 03/22/21 1114  BP: (!) 180/91 (!) 141/77  Pulse: 69 84  Resp: 20 19  Temp:    SpO2: 100% 99%      Constitutional: Alert and oriented.  Appears uncomfortable, tachypneic and holding his right-sided abdomen.  Speaking in full sentences. Eyes: Conjunctivae are normal. PERRL. EOMI. Head: Atraumatic. Nose: No congestion/rhinnorhea. Mouth/Throat: Mucous membranes are moist.  Oropharynx non-erythematous. Neck: No stridor. No cervical spine tenderness to  palpation. Cardiovascular: Normal rate, regular rhythm. Grossly normal heart sounds.  Good peripheral circulation. Respiratory: Tachypnea to the mid 20s.  Diffuse expiratory wheezes and decreased air movement throughout.  No focal features. Gastrointestinal: Soft , nondistended. No CVA tenderness. RUQ tenderness with voluntary guarding is noted.  No peritoneal features.  Abdomen is otherwise benign. Musculoskeletal: No lower extremity tenderness nor edema.  No joint effusions. No signs of acute trauma. Neurologic:  Normal speech and language. No gross focal neurologic deficits are appreciated. No gait instability noted. Skin:  Skin is warm, dry and intact. No rash noted. Psychiatric: Mood and affect are normal. Speech and behavior are normal.  ____________________________________________   LABS (all labs ordered are listed, but only abnormal results are displayed)  Labs Reviewed  COMPREHENSIVE METABOLIC PANEL - Abnormal; Notable for the following components:      Result Value   Glucose, Bld 112 (*)    All other components within normal limits  RESP PANEL BY RT-PCR (FLU A&B, COVID) ARPGX2  LIPASE, BLOOD  CBC  BRAIN NATRIURETIC PEPTIDE  URINALYSIS, COMPLETE (UACMP) WITH MICROSCOPIC  HEMOGLOBIN A1C  URINE DRUG SCREEN, QUALITATIVE ([facility] ONLY)  TROPONIN I (HIGH SENSITIVITY)  TROPONIN I (HIGH SENSITIVITY)  TROPONIN I (HIGH SENSITIVITY)   ____________________________________________  12 Lead EKG  Sinus rhythm with a rate of 74 bpm.  Normal axis.  Prolonged PR interval and prolonged QTc interval at 226 and 536 ms respectively.  Inferior and lateral nonspecific ST changes with ST depressions and biphasic T waves without STEMI criteria. These changes are new when compared to EKG from July 19. ____________________________________________  RADIOLOGY  ED MD interpretation: 1 view CXR reviewed by me without evidence of acute cardiopulmonary pathology. RUQ ultrasound reviewed by me with  cholelithiasis  Official radiology report(s): DG Chest Portable 1 View  Result Date: 03/22/2021 CLINICAL DATA:  Upper abdominal pain, hypoxia EXAM: PORTABLE CHEST 1 VIEW COMPARISON:  12/25/2020 FINDINGS: Heart is normal size. Aortic atherosclerosis. Hyperinflation. No confluent opacities or effusions. No acute bony abnormality. IMPRESSION: No active disease. Electronically Signed   By: Charlett Nose M.D.   On: 03/22/2021 09:24   US ABDOMEN LIMITED RUQ (LIVER/GB)  Result Date: 03/22/2021 CLINICAL DATA:  Mid abdominal pain, right upper quadrant pain. EXAM: ULTRASOUND ABDOMEN LIMITED RIGHT UPPER QUADRANT COMPARISON:  None. FINDINGS: Gallbladder: At least 3 large stones measuring up 2 1.7 cm. Gallbladder wall is slightly thickened at 4 mm. Negative sonographic Murphy's. Common bile duct: Diameter: Normal caliber, 5 mm Liver: No focal lesion identified. Within normal limits in parenchymal echogenicity. Portal vein is patent on color Doppler imaging with normal direction of blood flow towards the liver. Other: None. IMPRESSION: Cholelithiasis. Slight gallbladder wall thickening could reflect early cholecystitis. Recommend clinical correlation. Electronically Signed   By: Charlett Nose M.D.   On: 03/22/2021 09:52    ____________________________________________   PROCEDURES and INTERVENTIONS  Procedure(s) performed (including Critical Care):  .1-3 Lead EKG Interpretation Performed by: Delton Prairie, MD Authorized by: Delton Prairie, MD     Interpretation: normal     ECG rate:  80   ECG rate assessment: normal     Rhythm: sinus rhythm     Ectopy: none     Conduction: normal    Medications  morphine 2 MG/ML injection 2 mg (2 mg Intravenous Not Given 03/22/21 1201)  fentaNYL (SUBLIMAZE) injection 12.5 mcg (has no administration in time range)  diphenhydrAMINE (BENADRYL) capsule 25 mg (has no administration in time range)  acetaminophen (TYLENOL) tablet 650 mg (has no administration in time range)   hydrALAZINE (APRESOLINE) injection 5 mg (has no administration in time range)  nicotine (NICODERM CQ - dosed in mg/24 hours) patch 21 mg (has no administration in time range)  ipratropium-albuterol (DUONEB) 0.5-2.5 (3) MG/3ML nebulizer solution 3 mL (3 mLs Nebulization Not Given 03/22/21 1141)  albuterol (PROVENTIL) (2.5 MG/3ML) 0.083% nebulizer solution 2.5 mg (has no administration in time range)  dextromethorphan-guaiFENesin (MUCINEX DM) 30-600 MG per 12 hr tablet 1 tablet (has no administration in time range)  methylPREDNISolone sodium succinate (SOLU-MEDROL) 40 mg/mL injection 40 mg (has no administration in time range)  ipratropium-albuterol (DUONEB) 0.5-2.5 (3) MG/3ML nebulizer solution 6 mL (6 mLs Nebulization Given 03/22/21 0941)  methylPREDNISolone sodium succinate (SOLU-MEDROL) 125 mg/2 mL injection 125 mg (125 mg Intravenous Given 03/22/21 0941)  aspirin chewable tablet 324 mg (324 mg Oral Given 03/22/21 0940)  morphine 4 MG/ML injection 4 mg (4 mg Intravenous Given 03/22/21 0942)  ondansetron (ZOFRAN) injection 4 mg (4 mg Intravenous Given 03/22/21 0941)    ____________________________________________   MDM / ED COURSE   75 year old male presents to the ED  with evidence of COPD exacerbation and RUQ abdominal pain concerning for symptomatic cholelithiasis.  Initially appears quite uncomfortable with tachypnea and clutching his right-sided abdomen.  Does have stigmata of COPD exacerbation.  Improving clinical picture with analgesia and breathing treatments.  Continues to have RUQ tenderness on examination, and considering his history of cholelithiasis, RUQ ultrasound obtained and demonstrates some features of cholecystitis.  I discussed the case with surgery who agrees to consult.  Does have EKG changes concerning for ischemia, though his troponins are low.  We will provide aspirin.  Due to his multiple apparent pathologies, we will discussed with medicine for admission.  Clinical  Course as of 03/22/21 1238  Tue Mar 22, 2021  0912 5hrs of pain [DS]  1009 Reassessed.  Feeling better [DS]  1017 I spoke with Dr Claudine Mouton , who is happy to consult and agrees with medical admission considering cardiopulmonary issues at hand [DS]  1128 I spoke with Dr. Clyde Lundborg who agrees to admit [DS]    Clinical Course User Index [DS] Delton Prairie, MD    ____________________________________________   FINAL CLINICAL IMPRESSION(S) / ED DIAGNOSES  Final diagnoses:  RUQ abdominal pain  Calculus of gallbladder with acute cholecystitis without obstruction  COPD exacerbation [facility])     ED Discharge Orders     None        Cleland Simkins   Note:  This document was prepared using Dragon voice recognition software and may include unintentional dictation errors.    Delton Prairie, MD 03/22/21 [phone]

## 2021-03-23 LAB — HEMOGLOBIN A1C
Hgb A1c MFr Bld: 5.6 % (ref 4.8–5.6)
Mean Plasma Glucose: 114 mg/dL

## 2021-03-23 LAB — LIPID PANEL
Cholesterol: 133 mg/dL (ref 0–200)
HDL: 45 mg/dL (ref >40)
LDL Cholesterol: 81 mg/dL (ref 0–99)
Total CHOL/HDL Ratio: 3 RATIO
Triglycerides: 36 mg/dL (ref <150)
VLDL: 7 mg/dL (ref 0–40)

## 2021-03-23 LAB — CBG MONITORING, ED
Glucose-Capillary: 132 mg/dL — ABNORMAL HIGH (ref 70–99)
Glucose-Capillary: 195 mg/dL — ABNORMAL HIGH (ref 70–99)

## 2021-03-23 MED ORDER — PANTOPRAZOLE SODIUM 40 MG PO TBEC
40.0000 mg | DELAYED_RELEASE_TABLET | Freq: Every day | ORAL | Status: DC
  Administered 2021-03-23 – 2021-03-24 (×2): 40 mg via ORAL
  Filled 2021-03-23 (×2): qty 1

## 2021-03-23 MED ORDER — METOPROLOL TARTRATE 50 MG PO TABS
50.0000 mg | ORAL_TABLET | Freq: Two times a day (BID) | ORAL | Status: DC
  Administered 2021-03-23 – 2021-03-24 (×3): 50 mg via ORAL
  Filled 2021-03-23 (×3): qty 1

## 2021-03-23 MED ORDER — LISINOPRIL 20 MG PO TABS
40.0000 mg | ORAL_TABLET | Freq: Every day | ORAL | Status: DC
  Administered 2021-03-23 – 2021-03-24 (×2): 40 mg via ORAL
  Filled 2021-03-23: qty 4
  Filled 2021-03-23: qty 2

## 2021-03-23 MED ORDER — MOMETASONE FURO-FORMOTEROL FUM 200-5 MCG/ACT IN AERO
2.0000 | INHALATION_SPRAY | Freq: Two times a day (BID) | RESPIRATORY_TRACT | Status: DC
  Administered 2021-03-23 – 2021-03-24 (×3): 2 via RESPIRATORY_TRACT
  Filled 2021-03-23: qty 8.8

## 2021-03-23 MED ORDER — TIZANIDINE HCL 4 MG PO TABS
8.0000 mg | ORAL_TABLET | Freq: Every day | ORAL | Status: DC
  Administered 2021-03-23: 21:00:00 8 mg via ORAL
  Filled 2021-03-23 (×2): qty 2

## 2021-03-23 MED ORDER — HYDRALAZINE HCL 50 MG PO TABS
50.0000 mg | ORAL_TABLET | Freq: Two times a day (BID) | ORAL | Status: DC
  Administered 2021-03-23 – 2021-03-24 (×3): 50 mg via ORAL
  Filled 2021-03-23 (×3): qty 1

## 2021-03-23 MED ORDER — SIMVASTATIN 20 MG PO TABS
20.0000 mg | ORAL_TABLET | Freq: Every evening | ORAL | Status: DC
  Administered 2021-03-23: 18:00:00 20 mg via ORAL
  Filled 2021-03-23: qty 1

## 2021-03-23 MED ORDER — ASPIRIN EC 81 MG PO TBEC
81.0000 mg | DELAYED_RELEASE_TABLET | Freq: Every day | ORAL | Status: DC
  Administered 2021-03-23 – 2021-03-24 (×2): 81 mg via ORAL
  Filled 2021-03-23 (×2): qty 1

## 2021-03-23 MED ORDER — HEPARIN SODIUM (PORCINE) 5000 UNIT/ML IJ SOLN
5000.0000 [IU] | Freq: Three times a day (TID) | INTRAMUSCULAR | Status: DC
  Administered 2021-03-23 – 2021-03-24 (×4): 5000 [IU] via SUBCUTANEOUS
  Filled 2021-03-23 (×4): qty 1

## 2021-03-23 NOTE — ED Notes (Signed)
Trial without oxygen, pt desat to 79% without exertion.  MD Made aware.  Pt requests transition of care team to assist with getting home oxygen upon discharge, as he has been getting friends to dontate oxygen tanks to him, does not have a regular oxygen provider.

## 2021-03-23 NOTE — Evaluation (Addendum)
Clinical/Bedside Swallow Evaluation Patient Details  Name: Ian Herman MRN: [id number] Date of Birth: 06-12-1946  Today's Date: 03/23/2021 Time: SLP Start Time (ACUTE ONLY): 1550 SLP Stop Time (ACUTE ONLY): 1630 SLP Time Calculation (min) (ACUTE ONLY): 40 min  Past Medical History:  Past Medical History:  Diagnosis Date   Arthritis    Bronchitis    CHF (congestive heart failure) (HCC)    COPD (chronic obstructive pulmonary disease) (HCC)    Coronary artery disease    Depression    GERD (gastroesophageal reflux disease)    Hepatitis C    Hypertension    Myocardial infarction acute (HCC)    Pleural effusion    PTSD (post-traumatic stress disorder)    Shortness of breath dyspnea    Past Surgical History:  Past Surgical History:  Procedure Laterality Date   APPENDECTOMY     CORONARY ANGIOPLASTY     stent   INGUINAL HERNIA REPAIR Left 04/23/2015   Procedure: HERNIA REPAIR INGUINAL ADULT;  Surgeon: Nadeen Landau, MD;  Location: [facility] ORS;  Service: General;  Laterality: Left;   HPI:  Pt is a 75 y.o. male with medical history significant of gallstone, hypertension, hyperlipidemia, COPD not on oxygen BUT borrowing O2 from friends at home when he can per NSG report, GERD, depression with anxiety, PTSD, CAD, stent placement, pleural effusion, HCV, dCHF, tobacco abuse, COVID infection 2 months ago, who presents with right upper quadrant abdominal pain and shortness of breath.  Patient states that his abdominal pain started today, which is located in the right upper quadrant, constant, moderate, sharp, nonradiating.  Patient denies nausea vomiting, diarrhea.   Pt was admitted w/ COPD Exacerbation, RUQ abdominal pain and gallstone.  CXR: No active disease.    Assessment / Plan / Recommendation  Clinical Impression  Pt appears to present w/ adequate oropharyngeal phase swallow function w/ No oropharyngeal phase dysphagia noted, No neuromuscular deficits noted. Pt consumed po  trials w/ No overt, clinical s/s of aspiration during po trials. Pt appears at reduced risk for aspiration following general aspiration precautions. Pt denied having any swallowing issues at home -- he is only wearing his Upper Denture plate and does not wear the bottom plate. Pt is A/Ox3. Follows all instructions.   During po trials, pt consumed all consistencies w/ no overt coughing, decline in vocal quality, or change in respiratory presentation during/post trials. O2 sats 98%. Oral phase appeared [facility] w/ timely bolus management, mastication, and control of bolus propulsion for A-P transfer for swallowing. Oral clearing achieved w/ all trial consistencies. OM Exam appeared [facility] w/ no unilateral weakness noted. Speech Clear. Pt fed self w/ setup support.   Recommend a Regular consistency diet w/ well-Cut meats, moistened foods; Thin liquids. Recommend general aspiration and Reflux precautions. Dietician f/u as needed -- he stated he receives Ensure at home from the [place].  Education given on food consistencies and easy to eat options; general aspiration precautions. NSG to reconsult if any new needs arise. NSG agreed. SLP Visit Diagnosis: Dysphagia, unspecified (R13.10)    Aspiration Risk   (reduced)    Diet Recommendation   Regular consistency diet w/ well-Cut meats, moistened foods; Thin liquids. Recommend general aspiration and Reflux precautions. Dietician f/u as needed -- he stated he receives Ensure at home from the [place].  Medication Administration: Whole meds with liquid (use of puree if needed for ease)    Other  Recommendations Recommended Consults:  (Dietician f/u) Oral Care Recommendations: Oral care BID;Patient independent with oral  care Other Recommendations:  (n/a)    Recommendations for follow up therapy are one component of a multi-disciplinary discharge planning process, led by the attending physician.  Recommendations may be updated based on patient status, additional functional  criteria and insurance authorization.  Follow up Recommendations None      Frequency and Duration  (n/a)   (n/a)       Prognosis Prognosis for Safe Diet Advancement: Good Barriers to Reach Goals:  (none)      Swallow Study   General Date of Onset: 03/22/21 HPI: Pt is a 75 y.o. male with medical history significant of gallstone, hypertension, hyperlipidemia, COPD not on oxygen BUT borrowing O2 from friends at home when he can per NSG report, GERD, depression with anxiety, PTSD, CAD, stent placement, pleural effusion, HCV, dCHF, tobacco abuse, COVID infection 2 months ago, who presents with right upper quadrant abdominal pain and shortness of breath.  Patient states that his abdominal pain started today, which is located in the right upper quadrant, constant, moderate, sharp, nonradiating.  Patient denies nausea vomiting, diarrhea.  Pt was admitted w/ COPD Exacerbation, RUQ abdominal pain and gallstone.  CXR: No active disease. Type of Study: Bedside Swallow Evaluation Previous Swallow Assessment: none Diet Prior to this Study: Thin liquids (clears per MD) Temperature Spikes Noted: No (wbc 5.0) Respiratory Status: Nasal cannula (2L) History of Recent Intubation: No Behavior/Cognition: Alert;Cooperative;Pleasant mood Oral Cavity Assessment: Within Functional Limits Oral Care Completed by SLP: Recent completion by staff Oral Cavity - Dentition: Dentures, top (not wearing his lower denture plate) Vision: Functional for self-feeding Self-Feeding Abilities: Able to feed self Patient Positioning: Upright in bed (positioned himself) Baseline Vocal Quality: Normal Volitional Cough: Strong Volitional Swallow: Able to elicit    Oral/Motor/Sensory Function Overall Oral Motor/Sensory Function: Within functional limits   Ice Chips Ice chips: Not tested   Thin Liquid Thin Liquid: Within functional limits Presentation: Cup;Self Fed (~4 ozs)    Nectar Thick Nectar Thick Liquid: Not tested    Honey Thick Honey Thick Liquid: Not tested   Puree Puree: Within functional limits Presentation: Spoon;Self Fed (4 ozs)   Solid     Solid: Within functional limits Presentation: Self Fed (2 trials)         Jerilynn Som, MS, CCC-SLP Speech Language Pathologist Rehab Services [phone] Orton Capell 03/23/2021,4:45 PM

## 2021-03-23 NOTE — ED Notes (Signed)
Informed RN bed assigned 

## 2021-03-23 NOTE — Progress Notes (Signed)
[facility] SURGICAL PROGRESS NOTE (cpt [phone])  Hospital Day(s): 1.   Interval History: Patient seen and examined, no acute events or new complaints overnight. Patient reports he is feeling better. He had a little soreness in the RUQ this morning but this is much improved. No fever, chills, nausea, emesis. No new labs this morning. On Clear liquid diet; tolerating well and feeling hungry.   Review of Systems:  Constitutional: denies fever, chills  HEENT: denies cough or congestion  Respiratory: + shortness of breath  Cardiovascular: denies chest pain or palpitations  Gastrointestinal: + abdominal pain (improved), denied N/V, or diarrhea/and bowel function as per interval history Genitourinary: denies burning with urination or urinary frequency   Vital signs in last 24 hours: [min-max] current  Temp:  [97.6 F (36.4 C)-97.8 F (36.6 C)] 97.6 F (36.4 C) (10/11 2056) Pulse Rate:  [64-87] 66 (10/12 0600) Resp:  [14-20] 15 (10/12 0600) BP: (139-184)/(72-139) 147/79 (10/12 0600) SpO2:  [79 %-100 %] 100 % (10/12 0600) Weight:  [68 kg] 68 kg (10/11 0832)     Height: 5\' 8"  (172.7 cm) Weight: 68 kg BMI (Calculated): 22.81   Intake/Output last 2 shifts:  10/11 0701 - 10/12 0700 In: -  Out: 1150 [Urine:1150]   Physical Exam:  Constitutional: alert, cooperative and no distress  HENT: normocephalic without obvious abnormality  Eyes: PERRL, EOM's grossly intact and symmetric  Respiratory: breathing non-labored at rest, on [place], coarse breath sounds  Cardiovascular: regular rate and sinus rhythm  Gastrointestinal: Soft, does not appear overtly tender this morning, non-distended, no rebound/guarding Musculoskeletal: no edema or wounds, motor and sensation grossly intact, NT    Labs:  CBC Latest Ref Rng & Units 03/22/2021 12/29/2020 12/27/2020  WBC 4.0 - 10.5 K/uL 5.0 6.0 9.6  Hemoglobin 13.0 - 17.0 g/dL 12/29/2020 17.6 12.4(L)  Hematocrit 39.0 - 52.0 % 41.7 38.7(L) 35.7(L)   Platelets 150 - 400 K/uL 156 142(L) 131(L)   CMP Latest Ref Rng & Units 03/22/2021 12/29/2020 12/27/2020  Glucose 70 - 99 mg/dL 12/29/2020) 737(T) 062(I)  BUN 8 - 23 mg/dL 21 948(N) 46(E)  Creatinine 0.61 - 1.24 mg/dL 70(J 5.00 9.38  Sodium 135 - 145 mmol/L 138 132(L) 134(L)  Potassium 3.5 - 5.1 mmol/L 4.7 4.3 3.7  Chloride 98 - 111 mmol/L 101 98 99  CO2 22 - 32 mmol/L 25 30 26   Calcium 8.9 - 10.3 mg/dL 9.2 1.82) 8.3(L)  Total Protein 6.5 - 8.1 g/dL 7.5 6.3(L) 6.2(L)  Total Bilirubin 0.3 - 1.2 mg/dL 0.6 0.5 0.5  Alkaline Phos 38 - 126 U/L 81 54 46  AST 15 - 41 U/L 15 38 20  ALT 0 - 44 U/L 12 38 14    Imaging studies: No new pertinent imaging studies   Assessment/Plan: (ICD-10's: K80.20) 75 y.o. male with likely symptomatic cholelithiasis without evidence of cholecystitis, complicated by pertinent comorbidities including of significant cardiopulmonary changes.   - Again, I do not think he has evidence of cholecystitis and symptoms rather represent symptomatic cholelithiasis potentially vs exacerbation of cardiopulmonary disease. He may warrant outpatient evaluation of interval cholecystectomy in the future should he be optimized from a medical standpoint.   - Okay to advance diet as toerlated; recommend low fat diet             - No need for IV Abx - Pain control prn; antiemetics prn - Monitor abdominal examination              - Further management per  primary service  All of the above findings and recommendations were discussed with the patient, and the medical team, and all of patient's questions were answered to his expressed satisfaction.  -- Lynden Oxford, PA-C [facility] 03/23/2021, 7:26 AM [phone] M-F: 7am - 4pm

## 2021-03-23 NOTE — Progress Notes (Signed)
Patient belongings included a twenty dollar bill and two dollar bill with change, wallet, multiple sets of keys, pocket knife, a pill that he put in one of the two cigarette packs that is in the unit med room with his lighter in a labeled bag. Education and care plan started. Patient educated on call bell and non skid socks. Assessment completed. Patient finishing dinner now and resting.

## 2021-03-23 NOTE — Progress Notes (Signed)
[facility] Progress Note  Patient: Ian Herman    NID:[id number]  DOA: 03/22/2021     Date of Service: the patient was seen and examined on 03/23/2021  Chief Complaint  Patient presents with   Abdominal Pain   Brief hospital course: MASSAI HANKERSON is a 75 y.o. male with medical history significant of gallstone, hypertension, hyperlipidemia, COPD not on oxygen, GERD, depression with anxiety, PTSD, CAD, stent placement, pleural effusion, HCV, dCHF, tobacco abuse, COVID infection 2 months ago, who presents with right upper quadrant abdominal pain and shortness of breath.   Patient states that his abdominal pain started today, which is located in the right upper quadrant, constant, moderate, sharp, nonradiating.  Patient denies nausea vomiting, diarrhea.  Patient feels warm, but his body temperature 97.8 in ED.  No chills.  Patient denies chest pain.  He states that he has shortness of breath and cough with yellow-colored sputum production.  No symptoms of UTI.   ED Course: pt was found to have positive D-dimer, WBC 5.0, negative COVID PCR, troponin level 12, 11, electrolytes renal function okay, temperature normal, blood pressure 141/77, heart rate 84, RR 20, oxygen saturation 86% on room air, which improved to 99% on 2 L oxygen.  Chest x-ray negative.  Patient is admitted to progressive bed as inpatient.   Assessment and Plan: Principal Problem:   COPD exacerbation (HCC) Active Problems:   Protein-calorie malnutrition, severe   Acute on chronic respiratory failure with hypoxia (HCC)   Coronary artery disease   Hypertension   HLD (hyperlipidemia)   Tobacco abuse   Chronic diastolic CHF (congestive heart failure) (HCC)   RUQ abdominal pain   Gallstone   Acute on chronic respiratory failure with hypoxia due to COPD exacerbation [facility]): Patient has productive cough, shortness of breath, wheezing on auscultation, clinically consistent with COPD exacerbation.  D-dimer +0.69,  given recent COVID infection, will need to rule out PE. -Bronchodilators -Solu-Medrol 40 mg IV bid -Z pak  -Mucinex for cough  -Incentive spirometry - sputum culture -Nasal cannula oxygen as needed to maintain O2 saturation 93% or greater - CTA negative for PE and LE doppler negative for  DVT. Patient was saturating 79% on room air, required 3 to 4 L oxygen via nasal cannula.  RN was advised to qualify patient and [facility] consulted for oxygen arrangement for home use Patient was using oxygen at home from his friend.    RUQ abdominal pain and gallstone: Dr. [name] of general surgery is consulted. They do not think patient has evidence of cholecystitis.  "He may warrant outpatient evaluation of interval cholecystectomy in the future should he be optimized from a medical standpoint" per surgery -As needed fentanyl and Zofran for supportive care Patient was on clear liquid diet, advance to low-fat diet, we will follow diet tolerance  Protein-calorie malnutrition, severe:  -Ensure   Hx of coronary artery disease: Patient denies chest pain -Aspirin, Zocor   Hypertension -IV hydralazine as needed -Metoprolol, oral hydralazine, lisinopril   HLD (hyperlipidemia) -Zocor   Tobacco abuse -Nicotine patch -Did counseling about importance of quitting smoking   Chronic diastolic CHF (congestive heart failure) (HCC): 2D echo on 04/22/2015 showed EF> 55%.  Patient does not have leg edema.  No JVD.  BNP normal 36.  CHF is compensated. -Watch volume status closely   Body mass index is 22.81 kg/m.  Interventions:       Diet: Heart healthy DVT Prophylaxis: Subcutaneous Heparin    Advance goals of care discussion:  Full code  Family Communication: family was NOT present at bedside, at the time of interview.  The pt provided permission to discuss medical plan with the family. Opportunity was given to ask question and all questions were answered satisfactorily.   Disposition:  Pt is from  Home, admitted with RUQ paina nd SOB, still has resp failure, which precludes a safe discharge. Discharge to Home, when pain will be resolved and oxygen will be arranged for home use.  Subjective: Patient was admitted overnight due to right upper quadrant pain which is better now, pain is 6/10, patient breathing is improved, patient states that he is using oxygen intermittently at home on further questioning he stated to RN that he was using from his friend but never prescribed. Patient is still in hypoxic respiratory failure requiring supplemental O2 inhalation, we will continue to monitor overnight, diet advanced.  Physical Exam: General:  alert oriented to time, place, and person.  Appear in mild distress, affect appropriate Eyes: PERRLA ENT: Oral Mucosa Clear, moist  Neck: no JVD,  Cardiovascular: S1 and S2 Present, no Murmur,  Respiratory: good respiratory effort, Bilateral Air entry equal and Decreased, mild Crackles, mild wheezes Abdomen: Bowel Sound present, Soft and no tenderness,  Skin: no rashes Extremities: no Pedal edema, no calf tenderness Neurologic: without any new focal findings Gait not checked due to patient safety concerns  Vitals:   03/23/21 1100 03/23/21 1247 03/23/21 1400 03/23/21 1500  BP: (!) 141/71  126/65 (!) 135/93  Pulse: 62 95 60   Resp: 18 (!) 24 17 17   Temp:    97.9 F (36.6 C)  TempSrc:    Oral  SpO2: 100% (!) 84% 100% 100%  Weight:      Height:        Intake/Output Summary (Last 24 hours) at 03/23/2021 1609 Last data filed at 03/23/2021 1142 Gross per 24 hour  Intake --  Output 1350 ml  Net -1350 ml   Filed Weights   03/22/21 0832  Weight: 68 kg    Data Reviewed: I have personally reviewed and interpreted daily labs, tele strips, imagings as discussed above. I reviewed all nursing notes, pharmacy notes, vitals, pertinent old records I have discussed plan of care as described above with RN and patient/family.  CBC: Recent Labs  Lab  03/22/21 0834  WBC 5.0  HGB 13.9  HCT 41.7  MCV 90.5  PLT 156   Basic Metabolic Panel: Recent Labs  Lab 03/22/21 0834  NA 138  K 4.7  CL 101  CO2 25  GLUCOSE 112*  BUN 21  CREATININE 0.78  CALCIUM 9.2    Studies: 05/22/21 Venous Img Lower Bilateral (DVT)  Result Date: 03/22/2021 CLINICAL DATA:  Shortness of breath, abdominal pain and positive D-dimer. EXAM: BILATERAL LOWER EXTREMITY VENOUS DOPPLER ULTRASOUND TECHNIQUE: Gray-scale sonography with graded compression, as well as color Doppler and duplex ultrasound were performed to evaluate the lower extremity deep venous systems from the level of the common femoral vein and including the common femoral, femoral, profunda femoral, popliteal and calf veins including the posterior tibial, peroneal and gastrocnemius veins when visible. The superficial great saphenous vein was also interrogated. Spectral Doppler was utilized to evaluate flow at rest and with distal augmentation maneuvers in the common femoral, femoral and popliteal veins. COMPARISON:  None. FINDINGS: RIGHT LOWER EXTREMITY Common Femoral Vein: No evidence of thrombus. Normal compressibility, respiratory phasicity and response to augmentation. Saphenofemoral Junction: No evidence of thrombus. Normal compressibility and flow on color Doppler imaging.  Profunda Femoral Vein: No evidence of thrombus. Normal compressibility and flow on color Doppler imaging. Femoral Vein: No evidence of thrombus. Normal compressibility, respiratory phasicity and response to augmentation. Popliteal Vein: No evidence of thrombus. Normal compressibility, respiratory phasicity and response to augmentation. Calf Veins: No evidence of thrombus. Normal compressibility and flow on color Doppler imaging. Superficial Great Saphenous Vein: No evidence of thrombus. Normal compressibility. Venous Reflux:  None. Other Findings: No evidence of superficial thrombophlebitis or abnormal fluid collection. LEFT LOWER EXTREMITY  Common Femoral Vein: No evidence of thrombus. Normal compressibility, respiratory phasicity and response to augmentation. Saphenofemoral Junction: No evidence of thrombus. Normal compressibility and flow on color Doppler imaging. Profunda Femoral Vein: No evidence of thrombus. Normal compressibility and flow on color Doppler imaging. Femoral Vein: No evidence of thrombus. Normal compressibility, respiratory phasicity and response to augmentation. Popliteal Vein: No evidence of thrombus. Normal compressibility, respiratory phasicity and response to augmentation. Calf Veins: No evidence of thrombus. Normal compressibility and flow on color Doppler imaging. Superficial Great Saphenous Vein: No evidence of thrombus. Normal compressibility. Venous Reflux:  None. Other Findings: No evidence of superficial thrombophlebitis or abnormal fluid collection. IMPRESSION: No evidence of deep venous thrombosis in either lower extremity. Electronically Signed   By: Irish Lack M.D.   On: 03/22/2021 16:58    Scheduled Meds:  aspirin EC  81 mg Oral Daily   azithromycin  250 mg Oral Daily   feeding supplement  237 mL Oral BID BM   heparin  5,000 Units Subcutaneous Q8H   hydrALAZINE  50 mg Oral BID   ipratropium-albuterol  3 mL Nebulization Q4H   lisinopril  40 mg Oral Daily   methylPREDNISolone (SOLU-MEDROL) injection  40 mg Intravenous BID   metoprolol tartrate  50 mg Oral BID   mometasone-formoterol  2 puff Inhalation BID    morphine injection  2 mg Intravenous Once   nicotine  21 mg Transdermal Daily   pantoprazole  40 mg Oral Daily   simvastatin  20 mg Oral QPM   tiZANidine  8 mg Oral QHS   Continuous Infusions: PRN Meds: acetaminophen, albuterol, dextromethorphan-guaiFENesin, diphenhydrAMINE, fentaNYL (SUBLIMAZE) injection, hydrALAZINE  Time spent: 35 minutes  Author: Gillis Santa. MD [facility] 03/23/2021 4:09 PM  To reach On-call, see care teams to locate the attending and reach out to them  via [email or web address]. If 7PM-7AM, please contact night-coverage If you still have difficulty reaching the attending provider, please page the [facility] (Director on Call) for [facility] on amion for assistance.

## 2021-03-24 DIAGNOSIS — J441 Chronic obstructive pulmonary disease with (acute) exacerbation: Principal | ICD-10-CM

## 2021-03-24 LAB — GLUCOSE, CAPILLARY: Glucose-Capillary: 113 mg/dL — ABNORMAL HIGH (ref 70–99)

## 2021-03-24 MED ORDER — PREDNISONE 10 MG (21) PO TBPK: ORAL_TABLET | ORAL | 0 refills | Status: DC

## 2021-03-24 MED ORDER — BISACODYL 10 MG RE SUPP: 10.0000 mg | Freq: Every day | RECTAL | Status: DC | PRN

## 2021-03-24 MED ORDER — PREDNISONE 20 MG PO TABS: 20.0000 mg | ORAL_TABLET | Freq: Every day | ORAL | Status: DC

## 2021-03-24 MED ORDER — ACETAMINOPHEN 325 MG PO TABS: 650.0000 mg | ORAL_TABLET | Freq: Four times a day (QID) | ORAL | Status: AC | PRN

## 2021-03-24 MED ORDER — POLYETHYLENE GLYCOL 3350 17 G PO PACK
17.0000 g | PACK | Freq: Every day | ORAL | Status: DC
  Filled 2021-03-24: qty 1

## 2021-03-24 MED ORDER — BISACODYL 5 MG PO TBEC
10.0000 mg | DELAYED_RELEASE_TABLET | Freq: Every day | ORAL | Status: DC | PRN
  Administered 2021-03-24: 10:00:00 10 mg via ORAL
  Filled 2021-03-24: qty 2

## 2021-03-24 MED ORDER — PREDNISONE 20 MG PO TABS: 40.0000 mg | ORAL_TABLET | Freq: Every day | ORAL | Status: DC

## 2021-03-24 MED ORDER — PREDNISONE 20 MG PO TABS: 30.0000 mg | ORAL_TABLET | Freq: Every day | ORAL | Status: DC

## 2021-03-24 MED ORDER — PREDNISONE 10 MG PO TABS: 10.0000 mg | ORAL_TABLET | Freq: Every day | ORAL | Status: DC

## 2021-03-24 NOTE — TOC Progression Note (Signed)
Transition of Care [facility]) - Progression Note    Patient Details  Name: Ian Herman MRN: [id number] Date of Birth: 08/29/1945  Transition of Care [facility]) CM/SW Contact  Allayne Butcher, RN Phone Number: 03/24/2021, 3:29 PM  Clinical Narrative:    [facility] hotline was already notified of patient's admission probably by the ED.  [facility] is aware that patient will discharge today.  RNCM will fax DC summary once completed.     Expected Discharge Plan: Home/Self Care Barriers to Discharge: Barriers Resolved  Expected Discharge Plan and Services Expected Discharge Plan: Home/Self Care   Discharge Planning Services: CM Consult   Living arrangements for the past 2 months: Single Family Home Expected Discharge Date: 03/24/21               DME Arranged: N/A DME Agency: NA       HH Arranged: NA HH Agency: NA         Social Determinants of Health (SDOH) Interventions    Readmission Risk Interventions No flowsheet data found.

## 2021-03-24 NOTE — Progress Notes (Signed)
[facility] SURGICAL PROGRESS NOTE (cpt [phone])  Hospital Day(s): 2.   Interval History: Patient seen and examined, no acute events or new complaints overnight. Patient reports his abdominal pain on arrival is improving, some after eating but this is minimal. No fever, chills, nausea, emesis. His breathing is improved as well and he is off O2. No new labs or imaging this morning. He was advanced to low fat diet yesterday; tolerating well.   Review of Systems:  Constitutional: denies fever, chills  HEENT: denies cough or congestion  Respiratory: + shortness of breath (improved) Cardiovascular: denies chest pain or palpitations  Gastrointestinal: + abdominal pain (minimal, improved), denied N/V, or diarrhea/and bowel function as per interval history Genitourinary: denies burning with urination or urinary frequency    Vital signs in last 24 hours: [min-max] current  Temp:  [97.6 F (36.4 C)-98 F (36.7 C)] 97.7 F (36.5 C) (10/13 0714) Pulse Rate:  [54-95] 54 (10/13 0714) Resp:  [16-24] 16 (10/13 0714) BP: (105-155)/(56-100) 116/66 (10/13 0714) SpO2:  [84 %-100 %] 100 % (10/13 0714) Weight:  [54.3 kg] 54.3 kg (10/13 0530)     Height: 5\' 8"  (172.7 cm) Weight: 54.3 kg BMI (Calculated): 18.21   Intake/Output last 2 shifts:  10/12 0701 - 10/13 0700 In: 600 [P.O.:600] Out: 1700 [Urine:1700]   Physical Exam:  Constitutional: alert, cooperative and no distress  HENT: normocephalic without obvious abnormality  Eyes: PERRL, EOM's grossly intact and symmetric  Respiratory: breathing non-labored at rest, on [place], coarse breath sounds  Cardiovascular: regular rate and sinus rhythm  Gastrointestinal: Soft, does not appear overtly tender this morning, non-distended, no rebound/guarding Musculoskeletal: no edema or wounds, motor and sensation grossly intact, NT    Labs:  CBC Latest Ref Rng & Units 03/22/2021 12/29/2020 12/27/2020  WBC 4.0 - 10.5 K/uL 5.0 6.0 9.6  Hemoglobin  13.0 - 17.0 g/dL 12/29/2020 38.2 12.4(L)  Hematocrit 39.0 - 52.0 % 41.7 38.7(L) 35.7(L)  Platelets 150 - 400 K/uL 156 142(L) 131(L)   CMP Latest Ref Rng & Units 03/22/2021 12/29/2020 12/27/2020  Glucose 70 - 99 mg/dL 12/29/2020) 397(Q) 734(L)  BUN 8 - 23 mg/dL 21 937(T) 02(I)  Creatinine 0.61 - 1.24 mg/dL 09(B 3.53 2.99  Sodium 135 - 145 mmol/L 138 132(L) 134(L)  Potassium 3.5 - 5.1 mmol/L 4.7 4.3 3.7  Chloride 98 - 111 mmol/L 101 98 99  CO2 22 - 32 mmol/L 25 30 26   Calcium 8.9 - 10.3 mg/dL 9.2 2.42) 8.3(L)  Total Protein 6.5 - 8.1 g/dL 7.5 6.3(L) 6.2(L)  Total Bilirubin 0.3 - 1.2 mg/dL 0.6 0.5 0.5  Alkaline Phos 38 - 126 U/L 81 54 46  AST 15 - 41 U/L 15 38 20  ALT 0 - 44 U/L 12 38 14     Imaging studies: No new pertinent imaging studies   Assessment/Plan: (ICD-10's: K80.20) 75 y.o. male with likely symptomatic cholelithiasis without evidence of cholecystitis, complicated by pertinent comorbidities including of significant cardiopulmonary changes.   - Again, I do not think he has evidence of cholecystitis and symptoms rather represent symptomatic cholelithiasis potentially vs exacerbation of cardiopulmonary disease. He may warrant outpatient evaluation of interval cholecystectomy in the future should he be optimized from a medical standpoint.  - Okay to continue low fat diet; recommend continuation of this at home; I will place this in DC instructions             - No need for Abx - Pain control prn; antiemetics prn - Monitor  abdominal examination              - Further management per primary service; we will sign off but be available. He can follow up in 2 weeks after discharge with Dr Claudine Mouton for outpatient evaluation for elective cholecystectomy.    All of the above findings and recommendations were discussed with the patient, and the medical team, and all of patient's questions were answered to his expressed satisfaction.  -- Lynden Oxford, PA-C [facility] 03/24/2021, 7:37 AM [phone] M-F: 7am - 4pm

## 2021-03-24 NOTE — Discharge Summary (Signed)
[facility] Discharge Summary   Patient: Ian Herman OZH:[id number]  PCP: Louanne Skye, MD  Date of admission: 03/22/2021   Date of discharge:  03/24/2021     Discharge Diagnoses:  Principal Problem:   COPD exacerbation (HCC) Active Problems:   Protein-calorie malnutrition, severe   Acute on chronic respiratory failure with hypoxia (HCC)   Coronary artery disease   Hypertension   HLD (hyperlipidemia)   Tobacco abuse   Chronic diastolic CHF (congestive heart failure) (HCC)   RUQ abdominal pain   Gallstone   Admitted From: Home Disposition:  Home   Recommendations for Outpatient Follow-up:  PCP: in 1 wk F/[facility] Follow up LABS/TEST:     Follow-up Information     Campbell Lerner, MD. Schedule an appointment as soon as possible for a visit in 2 week(s).   Specialty: General Surgery Why: Hospital follow up, symptomatic choleclithiasis Contact information: [address] [place] [address] [phone]                Diet recommendation: Cardiac diet  Activity: The patient is advised to gradually reintroduce usual activities, as tolerated  Discharge Condition: stable  Code Status: Full code   History of present illness: As per the H and P dictated on admission Hospital Course:  Ian Herman is a 75 y.o. male with medical history significant of gallstone, hypertension, hyperlipidemia, COPD not on oxygen, GERD, depression with anxiety, PTSD, CAD, stent placement, pleural effusion, HCV, dCHF, tobacco abuse, COVID infection 2 months ago, who presents with right upper quadrant abdominal pain and shortness of breath. Patient states that his abdominal pain started today, which is located in the right upper quadrant, constant, moderate, sharp, nonradiating.  Patient denies nausea vomiting, diarrhea.  Patient feels warm, but his body temperature 97.8 in ED.  No chills.  Patient denies chest pain.  He states  that he has shortness of breath and cough with yellow-colored sputum production.  No symptoms of UTI. ED Course: pt was found to have positive D-dimer, WBC 5.0, negative COVID PCR, troponin level 12, 11, electrolytes renal function okay, temperature normal, blood pressure 141/77, heart rate 84, RR 20, oxygen saturation 86% on room air, which improved to 99% on 2 L oxygen.  Chest x-ray negative.  Patient is admitted to progressive bed as inpatient.  Assessment and Plan: Principal Problem:   COPD exacerbation (HCC) Active Problems:   Protein-calorie malnutrition, severe   Acute on chronic respiratory failure with hypoxia (HCC)   Coronary artery disease   Hypertension   HLD (hyperlipidemia)   Tobacco abuse   Chronic diastolic CHF (congestive heart failure) (HCC)   RUQ abdominal pain   Gallstone   Acute on chronic respiratory failure with hypoxia due to COPD exacerbation [facility]): Patient has productive cough, shortness of breath, wheezing on auscultation, clinically consistent with COPD exacerbation.  D-dimer +0.69, given recent COVID infection, CTA negative for PE. S/p Bronchodilators, -s/p Solu-Medrol 40 mg IV bid, discontinued today, started prednisone orally tapering dose. S/p Z pak, Mucinex for cough, Incentive spirometry.  No more need of antibiotics. CTA negative for PE and LE doppler negative for  DVT. 10/12 Patient was saturating 79% on room air, required 3 to 4 L oxygen via nasal cannula.   10/13 today patient is saturating well on room air, breathing is improved, oxygen saturation dropped to 89% on ambulation as per RN.  RUQ abdominal pain and gallstone: Dr. [name] of general surgery is consulted.  They do not think patient has evidence of cholecystitis.  "He may warrant outpatient evaluation of interval cholecystectomy in the future should he be optimized from a medical standpoint" per surgery S/p As needed fentanyl and Zofran for supportive care.  patient was on clear liquid diet,  advance to low-fat diet, which he tolerated well, denied any nausea vomiting.  Patient's pain gets worse after eating food.  We got a second opinion from Dr. Tonna Boehringer and recommended that we need to involve T[facility] to inform [facility] for [facility] otherwise patient can follow as an outpatient.  Patient was advised to stay but he wanted to go and get it done as an [facility] with the [facility].  So patient was discharged patient was advised to take Tylenol and Advil as needed for pain control and [facility] with [facility] for [facility].  Protein-calorie malnutrition, severe: s/p Ensure Hx of coronary artery disease: Patient denies chest pain, continue Aspirin, Zocor Hypertension, resumed home medications -Metoprolol, oral hydralazine, lisinopril HLD (hyperlipidemia) -Zocor Tobacco abuse, s/p Nicotine patch. -Did counseling about importance of quitting smoking Chronic diastolic CHF (congestive heart failure) (HCC): 2D echo on 04/22/2015 showed EF> 55%.  Patient does not have leg edema.  No JVD.  BNP normal 36.  CHF is compensated.  Patient was advised to continue fluid striction 1.5 L/day. Body mass index is 18.2 kg/m.   Nutrition Interventions:   Patient was ambulatory without any assistance. On the day of the discharge the patient's vitals were stable, and no other acute medical condition were reported by patient. the patient was felt safe to be discharge at Home.  Consultants: General surgery Procedures: none  Discharge Exam: General: Appear in mild distress, no Rash; Oral Mucosa Clear, moist. Cardiovascular: S1 and S2 Present, no Murmur, Respiratory: normal respiratory effort, Bilateral Air entry present and no Crackles, no wheezes Abdomen: Bowel Sound present, Soft, RUQ tenderness, no hernia Extremities: no Pedal edema, no calf tenderness Neurology: alert and oriented to time, place, and person affect appropriate.  Filed Weights   03/22/21 0832 03/24/21 0530  Weight: 68 kg 54.3 kg   Vitals:    03/24/21 0939 03/24/21 1130  BP: (!) 149/75 (!) 125/55  Pulse: 70 68  Resp:  20  Temp:  97.8 F (36.6 C)  SpO2: 95% 98%    DISCHARGE MEDICATION: Allergies as of 03/24/2021       Reactions   Trazodone    Other reaction(s): OTHER REACTION, dystonia, OTHER REACTION, dystonia   Chlorpromazine Hives, Anxiety, Rash   Hives Other reaction(s): Anxiety, AGITATION, Cramp, MUSCLE SPASMS, OTHER REACTION, PARKINSONIAN-LIKE SYNDROME, UNKNOWN   Codeine Itching, Rash   itching Other reaction(s): HIVES, SPASISMS, UNKNOWN        Medication List     TAKE these medications    acetaminophen 325 MG tablet Commonly known as: TYLENOL Take 2 tablets (650 mg total) by mouth every 6 (six) hours as needed for mild pain or fever.   albuterol 0.63 MG/3ML nebulizer solution Commonly known as: ACCUNEB Take 1 ampule by nebulization every 6 (six) hours as needed for wheezing.   aspirin EC 81 MG tablet Take 81 mg by mouth daily.   carboxymethylcellulose 0.5 % Soln Commonly known as: REFRESH PLUS Apply 1 drop to eye 3 (three) times daily as needed (dry eyes).   ENSURE PLUS PO Take 1 Can by mouth See admin instructions. Consume contents of 1 can every day separate from meals. Do not use as meal replacement.   fluticasone-salmeterol 250-50 MCG/ACT Aepb Commonly  known as: ADVAIR Inhale 1 puff into the lungs in the morning and at bedtime.   guaiFENesin 600 MG 12 hr tablet Commonly known as: MUCINEX Take 1 tablet by mouth daily.   hydrALAZINE 50 MG tablet Commonly known as: APRESOLINE Take 1 tablet by mouth in the morning and at bedtime.   Ipratropium-Albuterol 20-100 MCG/ACT Aers respimat Commonly known as: COMBIVENT Inhale 1 puff into the lungs every 6 (six) hours as needed for wheezing or shortness of breath.   ipratropium-albuterol 0.5-2.5 (3) MG/3ML Soln Commonly known as: DUONEB Take 3 mLs by nebulization every 6 (six) hours for 3 days.   lisinopril 40 MG tablet Commonly known  as: ZESTRIL Take 1 tablet by mouth daily.   meloxicam 15 MG tablet Commonly known as: MOBIC Take 1 tablet by mouth daily.   metoprolol tartrate 50 MG tablet Commonly known as: LOPRESSOR Take 1 tablet (50 mg total) by mouth 2 (two) times daily.   omeprazole 20 MG capsule Commonly known as: PRILOSEC Take 1 capsule by mouth daily.   predniSONE 10 MG (21) Tbpk tablet Commonly known as: STERAPRED UNI-PAK 21 TAB 40 mg daily for 2 days, 30 mg daily for 2 days, 20 mg for 2 days, 10 mg for 3 days   sildenafil 100 MG tablet Commonly known as: VIAGRA Take 100 mg by mouth as needed for erectile dysfunction.   simvastatin 20 MG tablet Commonly known as: ZOCOR Take 20 mg by mouth every evening.   tiZANidine 4 MG tablet Commonly known as: ZANAFLEX Take 2 tablets by mouth at bedtime.               Durable Medical Equipment  (From admission, onward)           Start     Ordered   03/23/21 1616  For home use only DME oxygen  Once       Question Answer Comment  Length of Need 6 Months   Mode or (Route) Nasal cannula   Liters per Minute 3   Frequency Continuous (stationary and portable oxygen unit needed)   Oxygen delivery system Gas      03/23/21 1615           Allergies  Allergen Reactions   Trazodone     Other reaction(s): OTHER REACTION, dystonia, OTHER REACTION, dystonia   Chlorpromazine Hives, Anxiety and Rash    Hives  Other reaction(s): Anxiety, AGITATION, Cramp, MUSCLE SPASMS, OTHER REACTION, PARKINSONIAN-LIKE SYNDROME, UNKNOWN   Codeine Itching and Rash    itching Other reaction(s): HIVES, SPASISMS, UNKNOWN   Discharge Instructions     Call MD for:  difficulty breathing, headache or visual disturbances   Complete by: As directed    Call MD for:  persistant nausea and vomiting   Complete by: As directed    Call MD for:  severe uncontrolled pain   Complete by: As directed    Call MD for:  temperature >100.4   Complete by: As directed    Diet -  low sodium heart healthy   Complete by: As directed    Discharge instructions   Complete by: As directed    Follow-up with PCP in 1 week Follow-up with general [facility].   Increase activity slowly   Complete by: As directed        The results of significant diagnostics from this hospitalization (including imaging, microbiology, ancillary and laboratory) are listed below for reference.    Significant  Diagnostic Studies: CT Angio Chest Pulmonary Embolism (PE) W or WO Contrast  Result Date: 03/22/2021 CLINICAL DATA:  Shortness of breath, COPD, right upper quadrant pain, positive D-dimer EXAM: CT ANGIOGRAPHY CHEST WITH CONTRAST TECHNIQUE: Multidetector CT imaging of the chest was performed using the standard protocol during bolus administration of intravenous contrast. Multiplanar CT image reconstructions and MIPs were obtained to evaluate the vascular anatomy. CONTRAST:  2mL OMNIPAQUE IOHEXOL 350 MG/ML SOLN COMPARISON:  Same day chest radiograph, CT chest 03/06/2006 FINDINGS: Cardiovascular: There is adequate opacification of the pulmonary arteries to the segmental level. There is no evidence of pulmonary embolism. The heart is not enlarged. There is no pericardial effusion. There are extensive coronary artery calcifications and calcified atherosclerotic plaque of the thoracic aorta. Mediastinum/Nodes: There is a subcentimeter right thyroid nodule. The esophagus is unremarkable there is no mediastinal, hilar, or axillary lymphadenopathy. Lungs/Pleura: There is debris in the trachea and left main bronchus. There is bronchiectasis. There is severe centrilobular and paraseptal emphysema with extensive apical scarring with architectural distortion, and right basilar fibrotic change. There is no focal consolidation. There is no pulmonary edema. There is no pleural effusion or pneumothorax. Upper Abdomen: The imaged portions of the upper abdominal  viscera are unremarkable. Musculoskeletal: There is no acute osseous abnormality or aggressive osseous lesion. Review of the MIP images confirms the above findings. IMPRESSION: 1. No evidence of pulmonary embolism. 2. Debris in the airways; correlate with any signs or symptoms of aspiration. 3. Severe centrilobular and paraseptal emphysema and bronchiectasis, with marked biapical scarring and architectural distortion and right basilar fibrotic change. 4. Extensive coronary artery calcifications. Aortic Atherosclerosis (ICD10-I70.0) and Emphysema (ICD10-J43.9). Electronically Signed   By: Lesia Hausen M.D.   On: 03/22/2021 15:50   US Venous Img Lower Bilateral (DVT)  Result Date: 03/22/2021 CLINICAL DATA:  Shortness of breath, abdominal pain and positive D-dimer. EXAM: BILATERAL LOWER EXTREMITY VENOUS DOPPLER ULTRASOUND TECHNIQUE: Gray-scale sonography with graded compression, as well as color Doppler and duplex ultrasound were performed to evaluate the lower extremity deep venous systems from the level of the common femoral vein and including the common femoral, femoral, profunda femoral, popliteal and calf veins including the posterior tibial, peroneal and gastrocnemius veins when visible. The superficial great saphenous vein was also interrogated. Spectral Doppler was utilized to evaluate flow at rest and with distal augmentation maneuvers in the common femoral, femoral and popliteal veins. COMPARISON:  None. FINDINGS: RIGHT LOWER EXTREMITY Common Femoral Vein: No evidence of thrombus. Normal compressibility, respiratory phasicity and response to augmentation. Saphenofemoral Junction: No evidence of thrombus. Normal compressibility and flow on color Doppler imaging. Profunda Femoral Vein: No evidence of thrombus. Normal compressibility and flow on color Doppler imaging. Femoral Vein: No evidence of thrombus. Normal compressibility, respiratory phasicity and response to augmentation. Popliteal Vein: No  evidence of thrombus. Normal compressibility, respiratory phasicity and response to augmentation. Calf Veins: No evidence of thrombus. Normal compressibility and flow on color Doppler imaging. Superficial Great Saphenous Vein: No evidence of thrombus. Normal compressibility. Venous Reflux:  None. Other Findings: No evidence of superficial thrombophlebitis or abnormal fluid collection. LEFT LOWER EXTREMITY Common Femoral Vein: No evidence of thrombus. Normal compressibility, respiratory phasicity and response to augmentation. Saphenofemoral Junction: No evidence of thrombus. Normal compressibility and flow on color Doppler imaging. Profunda Femoral Vein: No evidence of thrombus. Normal compressibility and flow on color Doppler imaging. Femoral Vein: No evidence of thrombus. Normal compressibility, respiratory phasicity and response to augmentation. Popliteal Vein: No evidence of thrombus. Normal compressibility, respiratory  phasicity and response to augmentation. Calf Veins: No evidence of thrombus. Normal compressibility and flow on color Doppler imaging. Superficial Great Saphenous Vein: No evidence of thrombus. Normal compressibility. Venous Reflux:  None. Other Findings: No evidence of superficial thrombophlebitis or abnormal fluid collection. IMPRESSION: No evidence of deep venous thrombosis in either lower extremity. Electronically Signed   By: Irish Lack M.D.   On: 03/22/2021 16:58   DG Chest Portable 1 View  Result Date: 03/22/2021 CLINICAL DATA:  Upper abdominal pain, hypoxia EXAM: PORTABLE CHEST 1 VIEW COMPARISON:  12/25/2020 FINDINGS: Heart is normal size. Aortic atherosclerosis. Hyperinflation. No confluent opacities or effusions. No acute bony abnormality. IMPRESSION: No active disease. Electronically Signed   By: Charlett Nose M.D.   On: 03/22/2021 09:24   US ABDOMEN LIMITED RUQ (LIVER/GB)  Result Date: 03/22/2021 CLINICAL DATA:  Mid abdominal pain, right upper quadrant pain. EXAM:  ULTRASOUND ABDOMEN LIMITED RIGHT UPPER QUADRANT COMPARISON:  None. FINDINGS: Gallbladder: At least 3 large stones measuring up 2 1.7 cm. Gallbladder wall is slightly thickened at 4 mm. Negative sonographic Murphy's. Common bile duct: Diameter: Normal caliber, 5 mm Liver: No focal lesion identified. Within normal limits in parenchymal echogenicity. Portal vein is patent on color Doppler imaging with normal direction of blood flow towards the liver. Other: None. IMPRESSION: Cholelithiasis. Slight gallbladder wall thickening could reflect early cholecystitis. Recommend clinical correlation. Electronically Signed   By: Charlett Nose M.D.   On: 03/22/2021 09:52    Microbiology: Recent Results (from the past 240 hour(s))  Resp Panel by RT-PCR (Flu A&B, Covid) Nasopharyngeal Swab     Status: None   Collection Time: 03/22/21  8:35 AM   Specimen: Nasopharyngeal Swab; Nasopharyngeal(NP) swabs in vial transport medium  Result Value Ref Range Status   SARS Coronavirus 2 by RT PCR NEGATIVE NEGATIVE Final    Comment: (NOTE) SARS-CoV-2 target nucleic acids are NOT DETECTED.  The SARS-CoV-2 RNA is generally detectable in upper respiratory specimens during the acute phase of infection. The lowest concentration of SARS-CoV-2 viral copies this assay can detect is 138 copies/mL. A negative result does not preclude SARS-Cov-2 infection and should not be used as the sole basis for treatment or other patient management decisions. A negative result may occur with  improper specimen collection/handling, submission of specimen other than nasopharyngeal swab, presence of viral mutation(s) within the areas targeted by this assay, and inadequate number of viral copies(<138 copies/mL). A negative result must be combined with clinical observations, patient history, and epidemiological information. The expected result is Negative.  Fact Sheet for Patients:  BloggerCourse.com  Fact Sheet for  Healthcare Providers:  SeriousBroker.it  This test is no t yet approved or cleared by the [place] FDA and  has been authorized for detection and/or diagnosis of SARS-CoV-2 by FDA under an Emergency Use Authorization (EUA). This EUA will remain  in effect (meaning this test can be used) for the duration of the COVID-19 declaration under Section 564(b)(1) of the Act, 21 U.S.C.section 360bbb-3(b)(1), unless the authorization is terminated  or revoked sooner.       Influenza A by PCR NEGATIVE NEGATIVE Final   Influenza B by PCR NEGATIVE NEGATIVE Final    Comment: (NOTE) The Xpert Xpress SARS-CoV-2/FLU/RSV plus assay is intended as an aid in the diagnosis of influenza from Nasopharyngeal swab specimens and should not be used as a sole basis for treatment. Nasal washings and aspirates are unacceptable for Xpert Xpress SARS-CoV-2/FLU/RSV testing.  Fact Sheet for Patients: BloggerCourse.com  Fact Sheet  for Healthcare Providers: SeriousBroker.it  This test is not yet approved or cleared by the [place] and has been authorized for detection and/or diagnosis of SARS-CoV-2 by FDA under an Emergency Use Authorization (EUA). This EUA will remain in effect (meaning this test can be used) for the duration of the COVID-19 declaration under Section 564(b)(1) of the Act, 21 U.S.C. section 360bbb-3(b)(1), unless the authorization is terminated or revoked.  Performed at [facility], [address] [place], [place] [address]      Labs: CBC: Recent Labs  Lab 03/22/21 0834  WBC 5.0  HGB 13.9  HCT 41.7  MCV 90.5  PLT 156   Basic Metabolic Panel: Recent Labs  Lab 03/22/21 0834  NA 138  K 4.7  CL 101  CO2 25  GLUCOSE 112*  BUN 21  CREATININE 0.78  CALCIUM 9.2   Liver Function Tests: Recent Labs  Lab 03/22/21 0834  AST 15  ALT 12  ALKPHOS 81  BILITOT 0.6  PROT 7.5   ALBUMIN 4.2   Recent Labs  Lab 03/22/21 0834  LIPASE 29   No results for input(s): AMMONIA in the last 168 hours. Cardiac Enzymes: No results for input(s): CKTOTAL, CKMB, CKMBINDEX, TROPONINI in the last 168 hours. BNP (last 3 results) Recent Labs    12/25/20 1125 03/22/21 0835  BNP 90.3 36.0   CBG: Recent Labs  Lab 03/23/21 0824 03/23/21 1139 03/24/21 0716  GLUCAP 132* 195* 113*    Time spent: 35 minutes  Signed:  Gillis Santa  [facility]  03/24/2021 2:10 PM

## 2021-03-24 NOTE — TOC Transition Note (Signed)
Transition of Care [facility]) - CM/SW Discharge Note   Patient Details  Name: Ian Herman MRN: [id number] Date of Birth: Nov 09, 1945  [facility] CM/SW Contact:  Allayne Butcher, RN Phone Number: 03/24/2021, 2:32 PM   Clinical Narrative:    Patient admitted to the hospital with COPD exacerbation.  TOC received a consult for home oxygen set up.  Patient at this time does not qualify for home O2.  He does report using friends oxygen at home when he feels he needs it.  He has a friend that has a concentrator at home and has given him a tank to have at home.  Informed patient that if he qualifies he can get oxygen from the [place].  Patient does not seem interested in this saying that he knows when he needs oxygen and the hospital and [facility] aren't going to tell him when he needs it, he knows when he needs it and he has his own supply.   Patient does get all care from the [facility], [facility] will notify the [place] of patient's admission and discharge.   Patient needs a ride home and nursing staff will call [facility].     Final next level of care: Home/Self Care Barriers to Discharge: Barriers Resolved   Patient Goals and CMS Choice Patient states their goals for this hospitalization and ongoing recovery are:: [facility] is in the hospital here      Discharge Placement                       Discharge Plan and Services   Discharge Planning Services: CM Consult            DME Arranged: N/A DME Agency: NA       HH Arranged: NA HH Agency: NA        Social Determinants of Health (SDOH) Interventions     Readmission Risk Interventions No flowsheet data found.

## 2021-03-24 NOTE — Progress Notes (Signed)
[facility] Progress Note  Patient: Ian Herman    VOP:[id number]  DOA: 03/22/2021     Date of Service: the patient was seen and examined on 03/24/2021  Chief Complaint  Patient presents with   Abdominal Pain   Brief hospital course: Ian Herman is a 75 y.o. male with medical history significant of gallstone, hypertension, hyperlipidemia, COPD not on oxygen, GERD, depression with anxiety, PTSD, CAD, stent placement, pleural effusion, HCV, dCHF, tobacco abuse, COVID infection 2 months ago, who presents with right upper quadrant abdominal pain and shortness of breath.   Patient states that his abdominal pain started today, which is located in the right upper quadrant, constant, moderate, sharp, nonradiating.  Patient denies nausea vomiting, diarrhea.  Patient feels warm, but his body temperature 97.8 in ED.  No chills.  Patient denies chest pain.  He states that he has shortness of breath and cough with yellow-colored sputum production.  No symptoms of UTI.   ED Course: pt was found to have positive D-dimer, WBC 5.0, negative COVID PCR, troponin level 12, 11, electrolytes renal function okay, temperature normal, blood pressure 141/77, heart rate 84, RR 20, oxygen saturation 86% on room air, which improved to 99% on 2 L oxygen.  Chest x-ray negative.  Patient is admitted to progressive bed as inpatient.   Assessment and Plan: Principal Problem:   COPD exacerbation (HCC) Active Problems:   Protein-calorie malnutrition, severe   Acute on chronic respiratory failure with hypoxia (HCC)   Coronary artery disease   Hypertension   HLD (hyperlipidemia)   Tobacco abuse   Chronic diastolic CHF (congestive heart failure) (HCC)   RUQ abdominal pain   Gallstone   Acute on chronic respiratory failure with hypoxia due to COPD exacerbation [facility]): Patient has productive cough, shortness of breath, wheezing on auscultation, clinically consistent with COPD exacerbation.  D-dimer +0.69,  given recent COVID infection, will need to rule out PE. -Bronchodilators -s/p Solu-Medrol 40 mg IV bid, discontinued today, started prednisone orally tapering dose -Z pak  -Mucinex for cough  -Incentive spirometry - sputum culture -Nasal cannula oxygen as needed to maintain O2 saturation 93% or greater - CTA negative for PE and LE doppler negative for  DVT. 10/12 Patient was saturating 79% on room air, required 3 to 4 L oxygen via nasal cannula.  RN was advised to qualify patient and [facility] consulted for oxygen arrangement for home use Patient was using oxygen at home from his friend. 10/13 today patient is saturating well on room air, breathing is improved but still patient complaining of right upper quadrant pain   RUQ abdominal pain and gallstone: Dr. [name] of general surgery is consulted. They do not think patient has evidence of cholecystitis.  "He may warrant outpatient evaluation of interval cholecystectomy in the future should he be optimized from a medical standpoint" per surgery -As needed fentanyl and Zofran for supportive care Patient was on clear liquid diet, advance to low-fat diet, we will follow diet tolerance 10/13 General surgery consulted again for lap chole evaluation as patient is persistently having right upper quadrant pain   Protein-calorie malnutrition, severe:  -Ensure   Hx of coronary artery disease: Patient denies chest pain -Aspirin, Zocor   Hypertension -IV hydralazine as needed -Metoprolol, oral hydralazine, lisinopril   HLD (hyperlipidemia) -Zocor   Tobacco abuse -Nicotine patch -Did counseling about importance of quitting smoking   Chronic diastolic CHF (congestive heart failure) (HCC): 2D echo on 04/22/2015 showed EF> 55%.  Patient does not have  leg edema.  No JVD.  BNP normal 36.  CHF is compensated. -Watch volume status closely   Body mass index is 22.81 kg/m.  Interventions:   TOC was consulted to inform [facility] to get [facility] that  patient needs lap chole as patient is symptomatic.     Diet: Heart healthy DVT Prophylaxis: Subcutaneous Heparin    Advance goals of care discussion: Full code  Family Communication: family was NOT present at bedside, at the time of interview.  The pt provided permission to discuss medical plan with the family. Opportunity was given to ask question and all questions were answered satisfactorily.   Disposition:  Pt is from Home, admitted with RUQ paina nd SOB, still RUQ pain, awaiting for surgical clearance, patient may need lap chole, which precludes a safe discharge. Discharge to Home, when pain will improve and cleared by general surgery, patient may need lap chole tomorrow a.m. awaiting for final recommendation from general surgery    Subjective: No significant events overnight, patient is having right upper quadrant pain after eating food, denied nausea vomiting.  Patient still has abdominal pain 7/10 in the right upper quadrant and tender to palpation. Patient agreed for the surgical procedure during this hospital stay, general surgery was informed.   Physical Exam: General:  alert oriented to time, place, and person.  Appear in mild distress, affect appropriate Eyes: PERRLA ENT: Oral Mucosa Clear, moist  Neck: no JVD,  Cardiovascular: S1 and S2 Present, no Murmur,  Respiratory: good respiratory effort, Bilateral Air entry equal and Decreased, no Crackles, no wheezes Abdomen: RUQ tenderness, soft, + BS,  Skin: no rashes Extremities: no Pedal edema, no calf tenderness Neurologic: without any new focal findings Gait not checked due to patient safety concerns  Vitals:   03/24/21 0530 03/24/21 0714 03/24/21 0939 03/24/21 1130  BP:  116/66 (!) 149/75 (!) 125/55  Pulse:  (!) 54 70 68  Resp:  16  20  Temp:  97.7 F (36.5 C)  97.8 F (36.6 C)  TempSrc:  Oral  Oral  SpO2:  100% 95% 98%  Weight: 54.3 kg     Height:        Intake/Output Summary (Last 24 hours) at 03/24/2021  1328 Last data filed at 03/24/2021 1003 Gross per 24 hour  Intake 1200 ml  Output 1500 ml  Net -300 ml   Filed Weights   03/22/21 0832 03/24/21 0530  Weight: 68 kg 54.3 kg    Data Reviewed: I have personally reviewed and interpreted daily labs, tele strips, imagings as discussed above. I reviewed all nursing notes, pharmacy notes, vitals, pertinent old records I have discussed plan of care as described above with RN and patient/family.  CBC: Recent Labs  Lab 03/22/21 0834  WBC 5.0  HGB 13.9  HCT 41.7  MCV 90.5  PLT 156   Basic Metabolic Panel: Recent Labs  Lab 03/22/21 0834  NA 138  K 4.7  CL 101  CO2 25  GLUCOSE 112*  BUN 21  CREATININE 0.78  CALCIUM 9.2    Studies: No results found.  Scheduled Meds:  aspirin EC  81 mg Oral Daily   azithromycin  250 mg Oral Daily   feeding supplement  237 mL Oral BID BM   heparin  5,000 Units Subcutaneous Q8H   hydrALAZINE  50 mg Oral BID   ipratropium-albuterol  3 mL Nebulization Q4H   lisinopril  40 mg Oral Daily   methylPREDNISolone (SOLU-MEDROL) injection  40 mg Intravenous BID  metoprolol tartrate  50 mg Oral BID   mometasone-formoterol  2 puff Inhalation BID    morphine injection  2 mg Intravenous Once   nicotine  21 mg Transdermal Daily   pantoprazole  40 mg Oral Daily   polyethylene glycol  17 g Oral Daily   simvastatin  20 mg Oral QPM   tiZANidine  8 mg Oral QHS   Continuous Infusions: PRN Meds: acetaminophen, albuterol, bisacodyl, bisacodyl, dextromethorphan-guaiFENesin, diphenhydrAMINE, fentaNYL (SUBLIMAZE) injection, hydrALAZINE  Time spent: 35 minutes  Author: Gillis Santa. MD [facility] 03/24/2021 1:28 PM  To reach On-call, see care teams to locate the attending and reach out to them via [email or web address]. If 7PM-7AM, please contact night-coverage If you still have difficulty reaching the attending provider, please page the [facility] (Director on Call) for [facility] on amion for  assistance.

## 2021-03-24 NOTE — Consult Note (Signed)
Subjective:   CC: biliary colic  HPI:  Ian Herman is a 75 y.o. male who is consulted by Lucianne Muss for evaluation of above cc.  Symptoms were first noted a few days ago. Pain is sharp, wax and wanes, located in RUQ, no radiation.  Associated with SOB, exacerbated by eating.     Past Medical History:  has a past medical history of Arthritis, Bronchitis, CHF (congestive heart failure) (HCC), COPD (chronic obstructive pulmonary disease) (HCC), Coronary artery disease, Depression, GERD (gastroesophageal reflux disease), Hepatitis C, Hypertension, Myocardial infarction acute (HCC), Pleural effusion, PTSD (post-traumatic stress disorder), and Shortness of breath dyspnea.  Past Surgical History:  has a past surgical history that includes Coronary angioplasty; Appendectomy; and Inguinal hernia repair (Left, 04/23/2015).  Family History: family history includes Lung cancer in his sister.  Social History:  reports that he has been smoking. He has been smoking an average of .5 packs per day. He has never used smokeless tobacco. He reports current alcohol use. He reports that he does not use drugs.  Current Medications:  Prior to Admission medications   Medication Sig Start Date End Date Taking? Authorizing Provider  albuterol (ACCUNEB) 0.63 MG/3ML nebulizer solution Take 1 ampule by nebulization every 6 (six) hours as needed for wheezing.   Yes [provider]  aspirin EC 81 MG tablet Take 81 mg by mouth daily.   Yes [provider]  carboxymethylcellulose (REFRESH PLUS) 0.5 % SOLN Apply 1 drop to eye 3 (three) times daily as needed (dry eyes).   Yes [provider]  fluticasone-salmeterol (ADVAIR) 250-50 MCG/ACT AEPB Inhale 1 puff into the lungs in the morning and at bedtime. 01/11/20  Yes [provider]  guaiFENesin (MUCINEX) 600 MG 12 hr tablet Take 1 tablet by mouth daily. 01/28/21  Yes [provider]  hydrALAZINE (APRESOLINE) 50 MG tablet Take 1 tablet  by mouth in the morning and at bedtime. 01/28/21  Yes [provider]  Ipratropium-Albuterol (COMBIVENT) 20-100 MCG/ACT AERS respimat Inhale 1 puff into the lungs every 6 (six) hours as needed for wheezing or shortness of breath.   Yes [provider]  lisinopril (ZESTRIL) 40 MG tablet Take 1 tablet by mouth daily. 01/28/21  Yes [provider]  meloxicam (MOBIC) 15 MG tablet Take 1 tablet by mouth daily. 01/28/21  Yes [provider]  metoprolol tartrate (LOPRESSOR) 50 MG tablet Take 1 tablet (50 mg total) by mouth 2 (two) times daily. 12/29/20  Yes Tyrone Nine, MD  omeprazole (PRILOSEC) 20 MG capsule Take 1 capsule by mouth daily. 01/28/21  Yes [provider]  sildenafil (VIAGRA) 100 MG tablet Take 100 mg by mouth as needed for erectile dysfunction.   Yes [provider]  simvastatin (ZOCOR) 20 MG tablet Take 20 mg by mouth every evening.   Yes [provider]  tiZANidine (ZANAFLEX) 4 MG tablet Take 2 tablets by mouth at bedtime. 01/28/21  Yes [provider]  ipratropium-albuterol (DUONEB) 0.5-2.5 (3) MG/3ML SOLN Take 3 mLs by nebulization every 6 (six) hours for 3 days. 08/07/18 12/25/20  Auburn Bilberry, MD  Nutritional Supplements (ENSURE PLUS PO) Take 1 Can by mouth See admin instructions. Consume contents of 1 can every day separate from meals. Do not use as meal replacement. 01/27/20   [provider]    Allergies:  Allergies as of 03/22/2021 - Review Complete 03/22/2021  Allergen Reaction Noted   Trazodone  03/26/2001   Chlorpromazine Hives, Anxiety, and Rash 12/04/1995  Codeine Itching and Rash 12/04/1995    ROS:  General: Denies weight loss, weight gain, fatigue, fevers, chills, and night sweats. Eyes: Denies blurry vision, double vision, eye pain, itchy eyes, and tearing. Ears: Denies hearing loss, earache, and ringing in ears. Nose: Denies sinus pain, congestion, infections, runny nose, and  nosebleeds. Mouth/throat: Denies hoarseness, sore throat, bleeding gums, and difficulty swallowing. Heart: Denies chest pain, palpitations, racing heart, irregular heartbeat, leg pain or swelling, and decreased activity tolerance. Respiratory: Denies breathing difficulty, shortness of breath, wheezing, cough, and sputum. GI: Denies change in appetite, heartburn, nausea, vomiting, constipation, diarrhea, and blood in stool. GU: Denies difficulty urinating, pain with urinating, urgency, frequency, blood in urine. Musculoskeletal: Denies joint stiffness, pain, swelling, muscle weakness. Skin: Denies rash, itching, mass, tumors, sores, and boils Neurologic: Denies headache, fainting, dizziness, seizures, numbness, and tingling. Psychiatric: Denies depression, anxiety, difficulty sleeping, and memory loss. Endocrine: Denies heat or cold intolerance, and increased thirst or urination. Blood/lymph: Denies easy bruising, and swollen glands     Objective:     BP (!) 149/75   Pulse 70   Temp 97.7 F (36.5 C) (Oral)   Resp 16   Ht 5\' 8"  (1.727 m)   Wt 54.3 kg   SpO2 95%   BMI 18.20 kg/m    Constitutional :  alert, cooperative, appears stated age, and no distress  Lymphatics/Throat:  no asymmetry, masses, or scars  Respiratory:  clear to auscultation bilaterally  Cardiovascular:  regular rate and rhythm  Gastrointestinal: Soft, no guarding, focal TTP in RUQ .   Musculoskeletal: Steady movement  Skin: Cool and moist, no surgical scars  Psychiatric: Normal affect, non-agitated, not confused       LABS:  CMP Latest Ref Rng & Units 03/22/2021 12/29/2020 12/27/2020  Glucose 70 - 99 mg/dL 12/29/2020) 902(I) 097(D)  BUN 8 - 23 mg/dL 21 532(D) 92(E)  Creatinine 0.61 - 1.24 mg/dL 26(S 3.41 9.62  Sodium 135 - 145 mmol/L 138 132(L) 134(L)  Potassium 3.5 - 5.1 mmol/L 4.7 4.3 3.7  Chloride 98 - 111 mmol/L 101 98 99  CO2 22 - 32 mmol/L 25 30 26   Calcium 8.9 - 10.3 mg/dL 9.2 2.29) 8.3(L)  Total  Protein 6.5 - 8.1 g/dL 7.5 6.3(L) 6.2(L)  Total Bilirubin 0.3 - 1.2 mg/dL 0.6 0.5 0.5  Alkaline Phos 38 - 126 U/L 81 54 46  AST 15 - 41 U/L 15 38 20  ALT 0 - 44 U/L 12 38 14   CBC Latest Ref Rng & Units 03/22/2021 12/29/2020 12/27/2020  WBC 4.0 - 10.5 K/uL 5.0 6.0 9.6  Hemoglobin 13.0 - 17.0 g/dL 12/31/2020 12/29/2020 12.4(L)  Hematocrit 39.0 - 52.0 % 41.7 38.7(L) 35.7(L)  Platelets 150 - 400 K/uL 156 142(L) 131(L)     RADS: CLINICAL DATA:  Mid abdominal pain, right upper quadrant pain.   EXAM: ULTRASOUND ABDOMEN LIMITED RIGHT UPPER QUADRANT   COMPARISON:  None.   FINDINGS: Gallbladder:   At least 3 large stones measuring up 2 1.7 cm. Gallbladder wall is slightly thickened at 4 mm. Negative sonographic Murphy's.   Common bile duct:   Diameter: Normal caliber, 5 mm   Liver:   No focal lesion identified. Within normal limits in parenchymal echogenicity. Portal vein is patent on color Doppler imaging with normal direction of blood flow towards the liver.   Other: None.   IMPRESSION: Cholelithiasis. Slight gallbladder wall thickening could reflect early cholecystitis. Recommend clinical correlation.     Electronically Signed   By: 92.1  Dover M.D.   On: 03/22/2021 09:52 Assessment:      Cholelithiasis Biliary colic  COPD exacerbation Cocaine abuse CAD Tobacco abuse CHF- compensated clinically   Plan:     Pt has multiple comorbidities as noted above placing him at extremely high risk of perioperative complications including cardio pulmonary issues that may lead to prolonged ventilation, MI, or even death.  His recent cocaine use also places him at higher risk as well.    Currently the only symptom he complains of is RUQ TTP, worse with eating.  I recommend we f/u with medical team to ensure no additional interventions are needed to improve his COPD exacerbation, and also make sure anesthesia is aware of his issues to further discuss r/b/a.  Ideally will like to  medically optimize prior to proceeding, if we can keep pain under control.  Pt verbalized understanding and agrees with plan.  Will discuss case with hospitalist and anesthesia team, as well as original surgery team to determine best timing of proceedings.  Will also need to ask case manager to confirm [facility] will approve of proceeding with such surgery outside of the [place] system.

## 2021-03-24 NOTE — Progress Notes (Signed)
SATURATION QUALIFICATIONS: (This note is used to comply with regulatory documentation for home oxygen)  Patient Saturations on Room Air at Rest = 95%  Patient Saturations on Room Air while Ambulating = 89%  

## 2021-03-31 ENCOUNTER — Emergency Department
Admission: EM | Admit: 2021-03-31 | Discharge: 2021-03-31 | Disposition: A | Discharge status: Home / Self Care | Payer: No Typology Code available for payment source | Attending: Emergency Medicine | Admitting: Emergency Medicine

## 2021-03-31 ENCOUNTER — Other Ambulatory Visit: Payer: Self-pay

## 2021-03-31 ENCOUNTER — Emergency Department: Payer: No Typology Code available for payment source

## 2021-03-31 DIAGNOSIS — F17210 Nicotine dependence, cigarettes, uncomplicated: Secondary | ICD-10-CM | POA: Insufficient documentation

## 2021-03-31 DIAGNOSIS — I110 Hypertensive heart disease with heart failure: Secondary | ICD-10-CM | POA: Diagnosis not present

## 2021-03-31 DIAGNOSIS — Z7982 Long term (current) use of aspirin: Secondary | ICD-10-CM | POA: Diagnosis not present

## 2021-03-31 DIAGNOSIS — Z955 Presence of coronary angioplasty implant and graft: Secondary | ICD-10-CM | POA: Diagnosis not present

## 2021-03-31 DIAGNOSIS — S6991XA Unspecified injury of right wrist, hand and finger(s), initial encounter: Secondary | ICD-10-CM | POA: Diagnosis present

## 2021-03-31 DIAGNOSIS — Z7951 Long term (current) use of inhaled steroids: Secondary | ICD-10-CM | POA: Insufficient documentation

## 2021-03-31 DIAGNOSIS — J449 Chronic obstructive pulmonary disease, unspecified: Secondary | ICD-10-CM | POA: Insufficient documentation

## 2021-03-31 DIAGNOSIS — W050XXA Fall from non-moving wheelchair, initial encounter: Secondary | ICD-10-CM | POA: Diagnosis not present

## 2021-03-31 DIAGNOSIS — I2510 Atherosclerotic heart disease of native coronary artery without angina pectoris: Secondary | ICD-10-CM | POA: Insufficient documentation

## 2021-03-31 DIAGNOSIS — I5032 Chronic diastolic (congestive) heart failure: Secondary | ICD-10-CM | POA: Insufficient documentation

## 2021-03-31 DIAGNOSIS — Z79899 Other long term (current) drug therapy: Secondary | ICD-10-CM | POA: Diagnosis not present

## 2021-03-31 DIAGNOSIS — S62001A Unspecified fracture of navicular [scaphoid] bone of right wrist, initial encounter for closed fracture: Secondary | ICD-10-CM | POA: Diagnosis not present

## 2021-03-31 MED ORDER — OXYCODONE-ACETAMINOPHEN 5-325 MG PO TABS
1.0000 | ORAL_TABLET | Freq: Once | ORAL | Status: AC
  Administered 2021-03-31: 1 via ORAL
  Filled 2021-03-31: qty 1

## 2021-03-31 MED ORDER — OXYCODONE-ACETAMINOPHEN 5-325 MG PO TABS: 1.0000 | ORAL_TABLET | ORAL | 0 refills | Status: DC | PRN

## 2021-03-31 NOTE — ED Triage Notes (Signed)
Pt here with a right hand injury. Pt states that he woke up and it was deformed. Pt got out of wheelchair and lowered himself to the floor in the ED lobby due to the pain. Pt in no NAD in triage.

## 2021-03-31 NOTE — ED Provider Notes (Signed)
[facility] Emergency Department Provider Note   ____________________________________________   Event Date/Time   First MD Initiated Contact with Patient 03/31/21 1604     (approximate)  I have reviewed the triage vital signs and the nursing notes.   HISTORY  Chief Complaint Hand Injury    HPI Ian Herman is a 75 y.o. male with past medical history of hypertension, hyperlipidemia, CAD, CHF, COPD, and HCV who presents to the ED complaining of hand pain.  Patient reports that he woke up this morning with pain and swelling along the dorsum of his right wrist.  He does not remember any recent falls or other trauma to his hand or wrist.  He denies prior issues with the wrist and has not had any recent cuts to the area.  He denies any fevers and does not have any history of gout.  Pain is described as sharp and worse with movement of the wrist, he has not taken anything at home for his symptoms.        Past Medical History:  Diagnosis Date   Arthritis    Bronchitis    CHF (congestive heart failure) (HCC)    COPD (chronic obstructive pulmonary disease) (HCC)    Coronary artery disease    Depression    GERD (gastroesophageal reflux disease)    Hepatitis C    Hypertension    Myocardial infarction acute (HCC)    Pleural effusion    PTSD (post-traumatic stress disorder)    Shortness of breath dyspnea     Patient Active Problem List   Diagnosis Date Noted   RUQ abdominal pain 03/22/2021   Chest pain 03/22/2021   Gallstone 03/22/2021   Acute on chronic respiratory failure with hypoxia (HCC) 12/25/2020   Coronary artery disease    Depression    Hypertension    HLD (hyperlipidemia)    CAP (community acquired pneumonia)    Severe sepsis (HCC)    Tobacco abuse    Chronic diastolic CHF (congestive heart failure) (HCC)    COPD exacerbation (HCC)    Anxiety state    Advanced care planning/counseling discussion    Goals of care,  counseling/discussion    Palliative care by specialist    Acute on chronic respiratory failure with hypoxia and hypercapnia (HCC) 08/02/2018   Protein-calorie malnutrition, severe 08/02/2018    Past Surgical History:  Procedure Laterality Date   APPENDECTOMY     CORONARY ANGIOPLASTY     stent   INGUINAL HERNIA REPAIR Left 04/23/2015   Procedure: HERNIA REPAIR INGUINAL ADULT;  Surgeon: Nadeen Landau, MD;  Location: [facility] ORS;  Service: General;  Laterality: Left;    Prior to Admission medications   Medication Sig Start Date End Date Taking? Authorizing Provider  oxyCODONE-acetaminophen (PERCOCET) 5-325 MG tablet Take 1 tablet by mouth every 4 (four) hours as needed for severe pain. 03/31/21 03/31/22 Yes Chesley Noon, MD  acetaminophen (TYLENOL) 325 MG tablet Take 2 tablets (650 mg total) by mouth every 6 (six) hours as needed for mild pain or fever. 03/24/21   Gillis Santa, MD  albuterol (ACCUNEB) 0.63 MG/3ML nebulizer solution Take 1 ampule by nebulization every 6 (six) hours as needed for wheezing.    [provider]  aspirin EC 81 MG tablet Take 81 mg by mouth daily.    [provider]  carboxymethylcellulose (REFRESH PLUS) 0.5 % SOLN Apply 1 drop to eye 3 (three) times daily as needed (dry eyes).    [provider]  fluticasone-salmeterol (ADVAIR) 250-50 MCG/ACT AEPB Inhale 1 puff into the lungs in the morning and at bedtime. 01/11/20   [provider]  guaiFENesin (MUCINEX) 600 MG 12 hr tablet Take 1 tablet by mouth daily. 01/28/21   [provider]  hydrALAZINE (APRESOLINE) 50 MG tablet Take 1 tablet by mouth in the morning and at bedtime. 01/28/21   [provider]  Ipratropium-Albuterol (COMBIVENT) 20-100 MCG/ACT AERS respimat Inhale 1 puff into the lungs every 6 (six) hours as needed for wheezing or shortness of breath.    [provider]  ipratropium-albuterol (DUONEB) 0.5-2.5 (3) MG/3ML SOLN Take 3 mLs by  nebulization every 6 (six) hours for 3 days. 08/07/18 12/25/20  Auburn Bilberry, MD  lisinopril (ZESTRIL) 40 MG tablet Take 1 tablet by mouth daily. 01/28/21   [provider]  meloxicam (MOBIC) 15 MG tablet Take 1 tablet by mouth daily. 01/28/21   [provider]  metoprolol tartrate (LOPRESSOR) 50 MG tablet Take 1 tablet (50 mg total) by mouth 2 (two) times daily. 12/29/20   Tyrone Nine, MD  Nutritional Supplements (ENSURE PLUS PO) Take 1 Can by mouth See admin instructions. Consume contents of 1 can every day separate from meals. Do not use as meal replacement. 01/27/20   [provider]  omeprazole (PRILOSEC) 20 MG capsule Take 1 capsule by mouth daily. 01/28/21   [provider]  predniSONE (STERAPRED UNI-PAK 21 TAB) 10 MG (21) TBPK tablet 40 mg daily for 2 days, 30 mg daily for 2 days, 20 mg for 2 days, 10 mg for 3 days 03/24/21   Gillis Santa, MD  sildenafil (VIAGRA) 100 MG tablet Take 100 mg by mouth as needed for erectile dysfunction.    [provider]  simvastatin (ZOCOR) 20 MG tablet Take 20 mg by mouth every evening.    [provider]  tiZANidine (ZANAFLEX) 4 MG tablet Take 2 tablets by mouth at bedtime. 01/28/21   [provider]    Allergies Trazodone, Chlorpromazine, and Codeine  Family History  Problem Relation Age of Onset   Lung cancer Sister     Social History Social History   Tobacco Use   Smoking status: Every Day    Packs/day: 0.50    Types: Cigarettes   Smokeless tobacco: Never  Substance Use Topics   Alcohol use: Yes    Comment: occasionally per patient   Drug use: No    Comment: IV drug use history mult. years ago.    Review of Systems  Constitutional: No fever/chills Eyes: No visual changes. ENT: No sore throat. Cardiovascular: Denies chest pain. Respiratory: Denies shortness of breath. Gastrointestinal: No abdominal pain.  No nausea, no vomiting.  No diarrhea.  No  constipation. Genitourinary: Negative for dysuria. Musculoskeletal: Negative for back pain.  Positive for right wrist pain and swelling. Skin: Negative for rash. Neurological: Negative for headaches, focal weakness or numbness.  ____________________________________________   PHYSICAL EXAM:  VITAL SIGNS: ED Triage Vitals  Enc Vitals Group     BP 03/31/21 1248 (!) 143/76     Pulse Rate 03/31/21 1248 85     Resp 03/31/21 1248 18     Temp 03/31/21 1248 97.6 F (36.4 C)     Temp Source 03/31/21 1248 Oral     SpO2 03/31/21 1248 97 %     Weight 03/31/21 1246 120 lb (54.4 kg)     Height 03/31/21 1246 5\' 8"  (1.727 m)     Head Circumference --  Peak Flow --      Pain Score 03/31/21 1246 10     Pain Loc --      Pain Edu? --      Excl. in GC? --     Constitutional: Alert and oriented. Eyes: Conjunctivae are normal. Head: Atraumatic. Nose: No congestion/rhinnorhea. Mouth/Throat: Mucous membranes are moist. Neck: Normal ROM Cardiovascular: Normal rate, regular rhythm. Grossly normal heart sounds.  2+ radial pulses bilaterally. Respiratory: Normal respiratory effort.  No retractions. Lungs CTAB. Gastrointestinal: Soft and nontender. No distention. Genitourinary: deferred Musculoskeletal: No lower extremity tenderness nor edema.  Tenderness to palpation diffusely to the right wrist, greatest over the dorsum.  Range of motion intact with mild to moderate discomfort noted at the wrist.  Patient with positive snuffbox tenderness, does have edema over the dorsum of his wrist with mild erythema but no warmth. Neurologic:  Normal speech and language. No gross focal neurologic deficits are appreciated. Skin:  Skin is warm, dry and intact. No rash noted. Psychiatric: Mood and affect are normal. Speech and behavior are normal.  ____________________________________________   LABS (all labs ordered are listed, but only abnormal results are displayed)  Labs Reviewed - No data to  display   PROCEDURES  Procedure(s) performed (including Critical Care):  Procedures   ____________________________________________   INITIAL IMPRESSION / ASSESSMENT AND PLAN / ED COURSE      75 year old male with past medical history of hypertension, hyperlipidemia, COPD, CAD, CHF, and HCV who presents to the ED with pain and swelling in his right wrist since waking up this morning.  Patient denies any recent trauma to the wrist, x-ray reviewed by me and shows irregularity at the scaphoid waist that represents possible fracture.  Given his scaphoid tenderness, we will place patient in thumb spica splint.  There are also signs on x-ray consistent with possible pseudogout, no suspicion for septic arthritis at this time given patient is able to range his wrist with no symptoms of infection.  Case discussed with Dr. Joice Lofts of orthopedics, who will review x-rays.  Dr. Joice Lofts agrees with plan for thumb spica splint and outpatient follow-up.  Patient's pain is improved following dose of Percocet and thumb spica splint placed by ED tech.  Patient made statements that he would likely remove the splint once he returns home, was advised not to do so but provided with a preformed wrist splint if he was unable to tolerate thumb spica.  He was counseled to follow-up with orthopedics and to return to the ED for new worsening symptoms, patient agrees with plan.      ____________________________________________   FINAL CLINICAL IMPRESSION(S) / ED DIAGNOSES  Final diagnoses:  Closed nondisplaced fracture of scaphoid of right wrist, unspecified portion of scaphoid, initial encounter     ED Discharge Orders          Ordered    oxyCODONE-acetaminophen (PERCOCET) 5-325 MG tablet  Every 4 hours PRN        03/31/21 1759             Note:  This document was prepared using Dragon voice recognition software and may include unintentional dictation errors.    Chesley Noon, MD 03/31/21 [phone]

## 2021-04-12 ENCOUNTER — Inpatient Hospital Stay: Payer: Non-veteran care | Admitting: Surgery

## 2021-04-26 ENCOUNTER — Emergency Department: Payer: No Typology Code available for payment source

## 2021-04-26 ENCOUNTER — Inpatient Hospital Stay
Admission: EM | Admit: 2021-04-26 | Discharge: 2021-04-30 | DRG: 871 | Disposition: A | Discharge status: Home Health | Payer: No Typology Code available for payment source | Attending: Internal Medicine | Admitting: Internal Medicine

## 2021-04-26 DIAGNOSIS — J101 Influenza due to other identified influenza virus with other respiratory manifestations: Secondary | ICD-10-CM | POA: Diagnosis present

## 2021-04-26 DIAGNOSIS — J439 Emphysema, unspecified: Secondary | ICD-10-CM | POA: Diagnosis present

## 2021-04-26 DIAGNOSIS — R778 Other specified abnormalities of plasma proteins: Secondary | ICD-10-CM | POA: Diagnosis not present

## 2021-04-26 DIAGNOSIS — I252 Old myocardial infarction: Secondary | ICD-10-CM

## 2021-04-26 DIAGNOSIS — F17210 Nicotine dependence, cigarettes, uncomplicated: Secondary | ICD-10-CM | POA: Diagnosis present

## 2021-04-26 DIAGNOSIS — E782 Mixed hyperlipidemia: Secondary | ICD-10-CM

## 2021-04-26 DIAGNOSIS — K219 Gastro-esophageal reflux disease without esophagitis: Secondary | ICD-10-CM | POA: Diagnosis present

## 2021-04-26 DIAGNOSIS — R7989 Other specified abnormal findings of blood chemistry: Secondary | ICD-10-CM | POA: Diagnosis not present

## 2021-04-26 DIAGNOSIS — I10 Essential (primary) hypertension: Secondary | ICD-10-CM | POA: Diagnosis present

## 2021-04-26 DIAGNOSIS — E861 Hypovolemia: Secondary | ICD-10-CM | POA: Diagnosis present

## 2021-04-26 DIAGNOSIS — E872 Acidosis, unspecified: Secondary | ICD-10-CM | POA: Diagnosis present

## 2021-04-26 DIAGNOSIS — F4310 Post-traumatic stress disorder, unspecified: Secondary | ICD-10-CM | POA: Diagnosis present

## 2021-04-26 DIAGNOSIS — R6520 Severe sepsis without septic shock: Secondary | ICD-10-CM | POA: Diagnosis present

## 2021-04-26 DIAGNOSIS — J9621 Acute and chronic respiratory failure with hypoxia: Secondary | ICD-10-CM | POA: Diagnosis present

## 2021-04-26 DIAGNOSIS — J441 Chronic obstructive pulmonary disease with (acute) exacerbation: Secondary | ICD-10-CM | POA: Diagnosis present

## 2021-04-26 DIAGNOSIS — Z720 Tobacco use: Secondary | ICD-10-CM

## 2021-04-26 DIAGNOSIS — Z7982 Long term (current) use of aspirin: Secondary | ICD-10-CM | POA: Diagnosis not present

## 2021-04-26 DIAGNOSIS — E871 Hypo-osmolality and hyponatremia: Secondary | ICD-10-CM

## 2021-04-26 DIAGNOSIS — Z801 Family history of malignant neoplasm of trachea, bronchus and lung: Secondary | ICD-10-CM

## 2021-04-26 DIAGNOSIS — Z79899 Other long term (current) drug therapy: Secondary | ICD-10-CM | POA: Diagnosis not present

## 2021-04-26 DIAGNOSIS — Z7951 Long term (current) use of inhaled steroids: Secondary | ICD-10-CM

## 2021-04-26 DIAGNOSIS — J111 Influenza due to unidentified influenza virus with other respiratory manifestations: Secondary | ICD-10-CM

## 2021-04-26 DIAGNOSIS — D6959 Other secondary thrombocytopenia: Secondary | ICD-10-CM | POA: Diagnosis present

## 2021-04-26 DIAGNOSIS — I160 Hypertensive urgency: Secondary | ICD-10-CM | POA: Diagnosis present

## 2021-04-26 DIAGNOSIS — A419 Sepsis, unspecified organism: Secondary | ICD-10-CM

## 2021-04-26 DIAGNOSIS — A4189 Other specified sepsis: Secondary | ICD-10-CM | POA: Diagnosis present

## 2021-04-26 DIAGNOSIS — Z955 Presence of coronary angioplasty implant and graft: Secondary | ICD-10-CM

## 2021-04-26 DIAGNOSIS — J9601 Acute respiratory failure with hypoxia: Secondary | ICD-10-CM

## 2021-04-26 DIAGNOSIS — I248 Other forms of acute ischemic heart disease: Secondary | ICD-10-CM | POA: Diagnosis present

## 2021-04-26 DIAGNOSIS — I2510 Atherosclerotic heart disease of native coronary artery without angina pectoris: Secondary | ICD-10-CM | POA: Diagnosis present

## 2021-04-26 DIAGNOSIS — Z20822 Contact with and (suspected) exposure to covid-19: Secondary | ICD-10-CM | POA: Diagnosis present

## 2021-04-26 DIAGNOSIS — Z9981 Dependence on supplemental oxygen: Secondary | ICD-10-CM

## 2021-04-26 LAB — URINALYSIS, COMPLETE (UACMP) WITH MICROSCOPIC
Bacteria, UA: NONE SEEN
Bilirubin Urine: NEGATIVE
Glucose, UA: NEGATIVE mg/dL
Ketones, ur: NEGATIVE mg/dL
Leukocytes,Ua: NEGATIVE
Nitrite: NEGATIVE
Protein, ur: 30 mg/dL — AB
Specific Gravity, Urine: 1.017 (ref 1.005–1.030)
Squamous Epithelial / HPF: NONE SEEN (ref 0–5)
pH: 6 (ref 5.0–8.0)

## 2021-04-26 LAB — BLOOD GAS, ARTERIAL
Acid-Base Excess: 0.5 mmol/L (ref 0.0–2.0)
Bicarbonate: 24.6 mmol/L (ref 20.0–28.0)
Delivery systems: POSITIVE
Expiratory PAP: 5
FIO2: 0.4
Inspiratory PAP: 10
O2 Saturation: 98.2 %
Patient temperature: 37
pCO2 arterial: 37 mmHg (ref 32.0–48.0)
pH, Arterial: 7.43 (ref 7.350–7.450)
pO2, Arterial: 106 mmHg (ref 83.0–108.0)

## 2021-04-26 LAB — COMPREHENSIVE METABOLIC PANEL
ALT: 10 U/L (ref 0–44)
AST: 18 U/L (ref 15–41)
Albumin: 4.2 g/dL (ref 3.5–5.0)
Alkaline Phosphatase: 96 U/L (ref 38–126)
Anion gap: 9 (ref 5–15)
BUN: 15 mg/dL (ref 8–23)
CO2: 28 mmol/L (ref 22–32)
Calcium: 8.9 mg/dL (ref 8.9–10.3)
Chloride: 97 mmol/L — ABNORMAL LOW (ref 98–111)
Creatinine, Ser: 0.78 mg/dL (ref 0.61–1.24)
GFR, Estimated: >60 mL/min (ref >60)
Glucose, Bld: 123 mg/dL — ABNORMAL HIGH (ref 70–99)
Potassium: 4.2 mmol/L (ref 3.5–5.1)
Sodium: 134 mmol/L — ABNORMAL LOW (ref 135–145)
Total Bilirubin: 0.7 mg/dL (ref 0.3–1.2)
Total Protein: 8.1 g/dL (ref 6.5–8.1)

## 2021-04-26 LAB — RESP PANEL BY RT-PCR (FLU A&B, COVID) ARPGX2
Influenza A by PCR: POSITIVE — AB
Influenza B by PCR: NEGATIVE
SARS Coronavirus 2 by RT PCR: NEGATIVE

## 2021-04-26 LAB — CBC
HCT: 40.2 % (ref 39.0–52.0)
Hemoglobin: 13.6 g/dL (ref 13.0–17.0)
MCH: 30.4 pg (ref 26.0–34.0)
MCHC: 33.8 g/dL (ref 30.0–36.0)
MCV: 89.9 fL (ref 80.0–100.0)
Platelets: 145 10*3/uL — ABNORMAL LOW (ref 150–400)
RBC: 4.47 MIL/uL (ref 4.22–5.81)
RDW: 12.8 % (ref 11.5–15.5)
WBC: 5.8 10*3/uL (ref 4.0–10.5)
nRBC: 0 % (ref 0.0–0.2)

## 2021-04-26 LAB — TROPONIN I (HIGH SENSITIVITY)
Troponin I (High Sensitivity): 27 ng/L — ABNORMAL HIGH (ref <18)
Troponin I (High Sensitivity): 36 ng/L — ABNORMAL HIGH (ref <18)
Troponin I (High Sensitivity): 49 ng/L — ABNORMAL HIGH (ref <18)

## 2021-04-26 LAB — MAGNESIUM: Magnesium: 1.9 mg/dL (ref 1.7–2.4)

## 2021-04-26 LAB — PROTIME-INR
INR: 1 (ref 0.8–1.2)
Prothrombin Time: 13.3 seconds (ref 11.4–15.2)

## 2021-04-26 LAB — LACTIC ACID, PLASMA
Lactic Acid, Venous: 2 mmol/L (ref 0.5–1.9)
Lactic Acid, Venous: 2.2 mmol/L (ref 0.5–1.9)
Lactic Acid, Venous: 2.2 mmol/L (ref 0.5–1.9)

## 2021-04-26 LAB — APTT: aPTT: 38 seconds — ABNORMAL HIGH (ref 24–36)

## 2021-04-26 LAB — BRAIN NATRIURETIC PEPTIDE: B Natriuretic Peptide: 246.8 pg/mL — ABNORMAL HIGH (ref 0.0–100.0)

## 2021-04-26 MED ORDER — METHYLPREDNISOLONE SODIUM SUCC 40 MG IJ SOLR
40.0000 mg | Freq: Two times a day (BID) | INTRAMUSCULAR | Status: DC
  Administered 2021-04-27 – 2021-04-29 (×5): 40 mg via INTRAVENOUS
  Filled 2021-04-26 (×5): qty 1

## 2021-04-26 MED ORDER — IPRATROPIUM-ALBUTEROL 0.5-2.5 (3) MG/3ML IN SOLN
3.0000 mL | Freq: Once | RESPIRATORY_TRACT | Status: AC
  Administered 2021-04-26: 3 mL via RESPIRATORY_TRACT
  Filled 2021-04-26: qty 3

## 2021-04-26 MED ORDER — METHYLPREDNISOLONE SODIUM SUCC 125 MG IJ SOLR
125.0000 mg | Freq: Once | INTRAMUSCULAR | Status: AC
  Administered 2021-04-26: 125 mg via INTRAVENOUS
  Filled 2021-04-26: qty 2

## 2021-04-26 MED ORDER — LORAZEPAM 2 MG/ML IJ SOLN
0.5000 mg | Freq: Once | INTRAMUSCULAR | Status: AC
  Administered 2021-04-26: 0.5 mg via INTRAVENOUS
  Filled 2021-04-26: qty 1

## 2021-04-26 MED ORDER — MAGNESIUM SULFATE 2 GM/50ML IV SOLN
2.0000 g | Freq: Once | INTRAVENOUS | Status: AC
  Administered 2021-04-26: 2 g via INTRAVENOUS
  Filled 2021-04-26: qty 50

## 2021-04-26 MED ORDER — OSELTAMIVIR PHOSPHATE 75 MG PO CAPS
75.0000 mg | ORAL_CAPSULE | Freq: Once | ORAL | Status: DC
  Filled 2021-04-26: qty 1

## 2021-04-26 MED ORDER — PANTOPRAZOLE SODIUM 40 MG IV SOLR
40.0000 mg | INTRAVENOUS | Status: DC
  Administered 2021-04-27: 40 mg via INTRAVENOUS
  Filled 2021-04-26: qty 40

## 2021-04-26 MED ORDER — SODIUM CHLORIDE 0.9 % IV SOLN
2.0000 g | Freq: Once | INTRAVENOUS | Status: AC
  Administered 2021-04-26: 2 g via INTRAVENOUS
  Filled 2021-04-26: qty 2

## 2021-04-26 MED ORDER — ENOXAPARIN SODIUM 40 MG/0.4ML IJ SOSY
40.0000 mg | PREFILLED_SYRINGE | INTRAMUSCULAR | Status: DC
  Administered 2021-04-26 – 2021-04-29 (×4): 40 mg via SUBCUTANEOUS
  Filled 2021-04-26 (×3): qty 0.4

## 2021-04-26 MED ORDER — LEVALBUTEROL HCL 0.63 MG/3ML IN NEBU
0.6300 mg | INHALATION_SOLUTION | RESPIRATORY_TRACT | Status: DC | PRN
  Administered 2021-04-27: 0.63 mg via RESPIRATORY_TRACT
  Filled 2021-04-26 (×2): qty 3

## 2021-04-26 MED ORDER — SODIUM CHLORIDE 0.9 % IV SOLN: Freq: Once | INTRAVENOUS | Status: AC

## 2021-04-26 MED ORDER — AZITHROMYCIN 500 MG PO TABS
500.0000 mg | ORAL_TABLET | Freq: Every day | ORAL | Status: AC
  Administered 2021-04-27: 500 mg via ORAL
  Filled 2021-04-26: qty 1

## 2021-04-26 MED ORDER — MORPHINE SULFATE (PF) 2 MG/ML IV SOLN
2.0000 mg | Freq: Once | INTRAVENOUS | Status: AC
  Administered 2021-04-26: 2 mg via INTRAVENOUS
  Filled 2021-04-26: qty 1

## 2021-04-26 MED ORDER — SODIUM CHLORIDE 0.9 % IV BOLUS
1000.0000 mL | Freq: Once | INTRAVENOUS | Status: AC
  Administered 2021-04-26: 1000 mL via INTRAVENOUS

## 2021-04-26 MED ORDER — DM-GUAIFENESIN ER 30-600 MG PO TB12
1.0000 | ORAL_TABLET | Freq: Two times a day (BID) | ORAL | Status: DC
  Administered 2021-04-27 – 2021-04-30 (×7): 1 via ORAL
  Filled 2021-04-26 (×7): qty 1

## 2021-04-26 MED ORDER — MORPHINE SULFATE (PF) 2 MG/ML IV SOLN
2.0000 mg | INTRAVENOUS | Status: DC | PRN
  Administered 2021-04-27 – 2021-04-30 (×12): 2 mg via INTRAVENOUS
  Filled 2021-04-26 (×12): qty 1

## 2021-04-26 MED ORDER — LACTATED RINGERS IV SOLN: INTRAVENOUS | Status: DC

## 2021-04-26 MED ORDER — AZITHROMYCIN 250 MG PO TABS
250.0000 mg | ORAL_TABLET | Freq: Every day | ORAL | Status: DC
  Administered 2021-04-28 – 2021-04-30 (×3): 250 mg via ORAL
  Filled 2021-04-26 (×3): qty 1

## 2021-04-26 MED ORDER — SODIUM CHLORIDE 0.9 % IV SOLN
500.0000 mg | Freq: Once | INTRAVENOUS | Status: AC
  Administered 2021-04-26: 500 mg via INTRAVENOUS
  Filled 2021-04-26: qty 500

## 2021-04-26 MED ORDER — LEVALBUTEROL HCL 0.63 MG/3ML IN NEBU
0.6300 mg | INHALATION_SOLUTION | Freq: Four times a day (QID) | RESPIRATORY_TRACT | Status: DC
  Administered 2021-04-26 – 2021-04-30 (×14): 0.63 mg via RESPIRATORY_TRACT
  Filled 2021-04-26 (×13): qty 3

## 2021-04-26 NOTE — ED Notes (Signed)
RN found pt disconnected from Bipap and monitor after pt got up and used the urinal. RN reconnected Bipap and montior. Pt HR in 150-160s and pts work of breathing labored. Pt a&ox4. MD notified of HR.

## 2021-04-26 NOTE — ED Notes (Signed)
This RN was flagged down by pt as he was sitting at edge of bed.  He stated that he needed to have a BM now and refused to use a bedpan.  This RN assisted pt to toilet.  Pt having a bowel movement at this time.

## 2021-04-26 NOTE — ED Triage Notes (Signed)
Pt arrives via EMS from home. Pt has been SOB for the last hr. Pt has hx of COPD with smoking 1/2ppd. Room sat was 88% at his home. Pt is currently at 4l/min on [place]. Pt is grunting and moaning while breathing at this time. EMS has given pt a duo-neb enroute to ED.

## 2021-04-26 NOTE — Sepsis Progress Note (Signed)
Elink tracking the code sepsis. 

## 2021-04-26 NOTE — Consult Note (Signed)
CODE SEPSIS - PHARMACY COMMUNICATION  **Broad Spectrum Antibiotics should be administered within 1 hour of Sepsis diagnosis**  Time Code Sepsis Called/Page Received: 1406  Antibiotics Ordered: Cefepime, Azithromycin  Time of 1st antibiotic administration: 1626  Additional action taken by pharmacy: none  If necessary, Name of Provider/Nurse Contacted: n/a    Bettey Costa ,PharmD Clinical Pharmacist  04/26/2021  4:41 PM

## 2021-04-26 NOTE — ED Provider Notes (Signed)
[facility] Emergency Department Provider Note    Event Date/Time   First MD Initiated Contact with Patient 04/26/21 1541     (approximate)  I have reviewed the triage vital signs and the nursing notes.   HISTORY  Chief Complaint Shortness of Breath  Level V Caveat: Resp distress  HPI Ian Herman is a 75 y.o. male with a history of CHF and COPD presents to the ER for acute worsening of shortness of breath.  Patient arrives in respiratory distress and grunting unable to answer questions and more than yes or no responses.  Does have very wet sounding cough.  Found to be febrile tachycardic and hypoxic.  Was hypoxic to the low 80s with EMS requiring 4 L.  He was given 1 neb in route.  They were unable to get IV access.  Past Medical History:  Diagnosis Date   Arthritis    Bronchitis    CHF (congestive heart failure) (HCC)    COPD (chronic obstructive pulmonary disease) (HCC)    Coronary artery disease    Depression    GERD (gastroesophageal reflux disease)    Hepatitis C    Hypertension    Myocardial infarction acute (HCC)    Pleural effusion    PTSD (post-traumatic stress disorder)    Shortness of breath dyspnea    Family History  Problem Relation Age of Onset   Lung cancer Sister    Past Surgical History:  Procedure Laterality Date   APPENDECTOMY     CORONARY ANGIOPLASTY     stent   INGUINAL HERNIA REPAIR Left 04/23/2015   Procedure: HERNIA REPAIR INGUINAL ADULT;  Surgeon: Nadeen Landau, MD;  Location: [facility] ORS;  Service: General;  Laterality: Left;   Patient Active Problem List   Diagnosis Date Noted   Influenza A 04/26/2021   RUQ abdominal pain 03/22/2021   Chest pain 03/22/2021   Gallstone 03/22/2021   Acute on chronic respiratory failure with hypoxia (HCC) 12/25/2020   Coronary artery disease    Depression    Hypertension    HLD (hyperlipidemia)    CAP (community acquired pneumonia)    Severe sepsis (HCC)    Tobacco  abuse    Chronic diastolic CHF (congestive heart failure) (HCC)    COPD exacerbation (HCC)    Anxiety state    Advanced care planning/counseling discussion    Goals of care, counseling/discussion    Palliative care by specialist    Acute on chronic respiratory failure with hypoxia and hypercapnia (HCC) 08/02/2018   Protein-calorie malnutrition, severe 08/02/2018      Prior to Admission medications   Medication Sig Start Date End Date Taking? Authorizing Provider  acetaminophen (TYLENOL) 325 MG tablet Take 2 tablets (650 mg total) by mouth every 6 (six) hours as needed for mild pain or fever. 03/24/21   Gillis Santa, MD  albuterol (ACCUNEB) 0.63 MG/3ML nebulizer solution Take 1 ampule by nebulization every 6 (six) hours as needed for wheezing.    [provider]  aspirin EC 81 MG tablet Take 81 mg by mouth daily.    [provider]  carboxymethylcellulose (REFRESH PLUS) 0.5 % SOLN Apply 1 drop to eye 3 (three) times daily as needed (dry eyes).    [provider]  fluticasone-salmeterol (ADVAIR) 250-50 MCG/ACT AEPB Inhale 1 puff into the lungs in the morning and at bedtime. 01/11/20   [provider]  guaiFENesin (MUCINEX) 600 MG 12 hr tablet Take 1 tablet by mouth daily. 01/28/21  [provider]  hydrALAZINE (APRESOLINE) 50 MG tablet Take 1 tablet by mouth in the morning and at bedtime. 01/28/21   [provider]  Ipratropium-Albuterol (COMBIVENT) 20-100 MCG/ACT AERS respimat Inhale 1 puff into the lungs every 6 (six) hours as needed for wheezing or shortness of breath.    [provider]  ipratropium-albuterol (DUONEB) 0.5-2.5 (3) MG/3ML SOLN Take 3 mLs by nebulization every 6 (six) hours for 3 days. 08/07/18 12/25/20  Auburn Bilberry, MD  lisinopril (ZESTRIL) 40 MG tablet Take 1 tablet by mouth daily. 01/28/21   [provider]  meloxicam (MOBIC) 15 MG tablet Take 1 tablet by mouth daily. 01/28/21   [provider]   metoprolol tartrate (LOPRESSOR) 50 MG tablet Take 1 tablet (50 mg total) by mouth 2 (two) times daily. 12/29/20   Tyrone Nine, MD  Nutritional Supplements (ENSURE PLUS PO) Take 1 Can by mouth See admin instructions. Consume contents of 1 can every day separate from meals. Do not use as meal replacement. 01/27/20   [provider]  omeprazole (PRILOSEC) 20 MG capsule Take 1 capsule by mouth daily. 01/28/21   [provider]  oxyCODONE-acetaminophen (PERCOCET) 5-325 MG tablet Take 1 tablet by mouth every 4 (four) hours as needed for severe pain. 03/31/21 03/31/22  Chesley Noon, MD  predniSONE (STERAPRED UNI-PAK 21 TAB) 10 MG (21) TBPK tablet 40 mg daily for 2 days, 30 mg daily for 2 days, 20 mg for 2 days, 10 mg for 3 days 03/24/21   Gillis Santa, MD  sildenafil (VIAGRA) 100 MG tablet Take 100 mg by mouth as needed for erectile dysfunction.    [provider]  simvastatin (ZOCOR) 20 MG tablet Take 20 mg by mouth every evening.    [provider]  tiZANidine (ZANAFLEX) 4 MG tablet Take 2 tablets by mouth at bedtime. 01/28/21   [provider]    Allergies Trazodone, Chlorpromazine, and Codeine    Social History Social History   Tobacco Use   Smoking status: Every Day    Packs/day: 0.50    Types: Cigarettes   Smokeless tobacco: Never  Substance Use Topics   Alcohol use: Yes    Comment: occasionally per patient   Drug use: No    Comment: IV drug use history mult. years ago.    Review of Systems Patient denies headaches, rhinorrhea, blurry vision, numbness, shortness of breath, chest pain, edema, cough, abdominal pain, nausea, vomiting, diarrhea, dysuria, fevers, rashes or hallucinations unless otherwise stated above in HPI. ____________________________________________   PHYSICAL EXAM:  VITAL SIGNS: Vitals:   04/26/21 1630 04/26/21 1759  BP: 135/84 (!) 192/125  Pulse: (!) 136 (!) 127  Resp: (!) 33 (!) 26  Temp:    SpO2: 99% 100%     Constitutional: Alert, no tripoding respiratory distress Eyes: Conjunctivae are normal.  Head: Atraumatic. Nose: No congestion/rhinnorhea. Mouth/Throat: Mucous membranes are moist.   Neck: No stridor. Painless ROM.  Cardiovascular: Tachycardic. Grossly normal heart sounds.  Good peripheral circulation. Respiratory: Significant tachypnea with use of accessory muscles faint wheezing heard in periphery with diminished breath sounds overall. Gastrointestinal: Soft and nontender. No distention. No abdominal bruits. No CVA tenderness. Genitourinary:  Musculoskeletal: No lower extremity tenderness nor edema.  No joint effusions. Neurologic:  Normal speech and language. No gross focal neurologic deficits are appreciated. No facial droop Skin:  Skin is warm, dry and intact. No rash noted. Psychiatric: anxious  ____________________________________________   LABS (all labs ordered are listed, but only abnormal  results are displayed)  Results for orders placed or performed during the hospital encounter of 04/26/21 (from the past 24 hour(s))  Resp Panel by RT-PCR (Flu A&B, Covid) Nasopharyngeal Swab     Status: Abnormal   Collection Time: 04/26/21  3:51 PM   Specimen: Nasopharyngeal Swab; Nasopharyngeal(NP) swabs in vial transport medium  Result Value Ref Range   SARS Coronavirus 2 by RT PCR NEGATIVE NEGATIVE   Influenza A by PCR POSITIVE (A) NEGATIVE   Influenza B by PCR NEGATIVE NEGATIVE  Blood gas, venous     Status: Abnormal (Preliminary result)   Collection Time: 04/26/21  3:51 PM  Result Value Ref Range   pH, Ven 7.34 7.250 - 7.430   pCO2, Ven 59 44.0 - 60.0 mmHg   pO2, Ven PENDING 32.0 - 45.0 mmHg   Bicarbonate 31.8 (H) 20.0 - 28.0 mmol/L   Acid-Base Excess 4.2 (H) 0.0 - 2.0 mmol/L   O2 Saturation 30.7 %   Patient temperature 37.0    Collection site VEIN    Sample type VEIN   Brain natriuretic peptide     Status: Abnormal   Collection Time: 04/26/21  3:54 PM  Result Value  Ref Range   B Natriuretic Peptide 246.8 (H) 0.0 - 100.0 pg/mL  CBC     Status: Abnormal   Collection Time: 04/26/21  3:55 PM  Result Value Ref Range   WBC 5.8 4.0 - 10.5 K/uL   RBC 4.47 4.22 - 5.81 MIL/uL   Hemoglobin 13.6 13.0 - 17.0 g/dL   HCT 78.2 42.3 - 53.6 %   MCV 89.9 80.0 - 100.0 fL   MCH 30.4 26.0 - 34.0 pg   MCHC 33.8 30.0 - 36.0 g/dL   RDW 14.4 31.5 - 40.0 %   Platelets 145 (L) 150 - 400 K/uL   nRBC 0.0 0.0 - 0.2 %  Comprehensive metabolic panel     Status: Abnormal   Collection Time: 04/26/21  3:55 PM  Result Value Ref Range   Sodium 134 (L) 135 - 145 mmol/L   Potassium 4.2 3.5 - 5.1 mmol/L   Chloride 97 (L) 98 - 111 mmol/L   CO2 28 22 - 32 mmol/L   Glucose, Bld 123 (H) 70 - 99 mg/dL   BUN 15 8 - 23 mg/dL   Creatinine, Ser 8.67 0.61 - 1.24 mg/dL   Calcium 8.9 8.9 - 61.9 mg/dL   Total Protein 8.1 6.5 - 8.1 g/dL   Albumin 4.2 3.5 - 5.0 g/dL   AST 18 15 - 41 U/L   ALT 10 0 - 44 U/L   Alkaline Phosphatase 96 38 - 126 U/L   Total Bilirubin 0.7 0.3 - 1.2 mg/dL   GFR, Estimated >50 >93 mL/min   Anion gap 9 5 - 15  Troponin I (High Sensitivity)     Status: Abnormal   Collection Time: 04/26/21  3:55 PM  Result Value Ref Range   Troponin I (High Sensitivity) 27 (H) <18 ng/L  Lactic acid, plasma     Status: Abnormal   Collection Time: 04/26/21  3:55 PM  Result Value Ref Range   Lactic Acid, Venous 2.0 (HH) 0.5 - 1.9 mmol/L  Protime-INR     Status: None   Collection Time: 04/26/21  3:55 PM  Result Value Ref Range   Prothrombin Time 13.3 11.4 - 15.2 seconds   INR 1.0 0.8 - 1.2  APTT     Status: Abnormal   Collection Time: 04/26/21  3:55 PM  Result  Value Ref Range   aPTT 38 (H) 24 - 36 seconds  Urinalysis, Complete w Microscopic     Status: Abnormal   Collection Time: 04/26/21  3:56 PM  Result Value Ref Range   Color, Urine YELLOW (A) YELLOW   APPearance CLEAR (A) CLEAR   Specific Gravity, Urine 1.017 1.005 - 1.030   pH 6.0 5.0 - 8.0   Glucose, UA NEGATIVE  NEGATIVE mg/dL   Hgb urine dipstick MODERATE (A) NEGATIVE   Bilirubin Urine NEGATIVE NEGATIVE   Ketones, ur NEGATIVE NEGATIVE mg/dL   Protein, ur 30 (A) NEGATIVE mg/dL   Nitrite NEGATIVE NEGATIVE   Leukocytes,Ua NEGATIVE NEGATIVE   RBC / HPF 0-5 0 - 5 RBC/hpf   WBC, UA 0-5 0 - 5 WBC/hpf   Bacteria, UA NONE SEEN NONE SEEN   Squamous Epithelial / LPF NONE SEEN 0 - 5   Mucus PRESENT   Lactic acid, plasma     Status: Abnormal   Collection Time: 04/26/21  6:00 PM  Result Value Ref Range   Lactic Acid, Venous 2.2 (HH) 0.5 - 1.9 mmol/L   ____________________________________________  EKG My review and personal interpretation at Time: 16:02   Indication: sob  Rate: 130  Rhythm: sinus Axis: normal Other: nonspecific st abn with resp motion artifact ____________________________________________  RADIOLOGY  I personally reviewed all radiographic images ordered to evaluate for the above acute complaints and reviewed radiology reports and findings.  These findings were personally discussed with the patient.  Please see medical record for radiology report.  ____________________________________________   PROCEDURES  Procedure(s) performed:  .Critical Care Performed by: Willy Eddy, MD Authorized by: Willy Eddy, MD   Critical care provider statement:    Critical care time (minutes):  35   Critical care was necessary to treat or prevent imminent or life-threatening deterioration of the following conditions:  Respiratory failure   Critical care was time spent personally by me on the following activities:  Ordering and performing treatments and interventions, ordering and review of laboratory studies, ordering and review of radiographic studies, pulse oximetry, re-evaluation of patient's condition, review of old charts, obtaining history from patient or surrogate, examination of patient, evaluation of patient's response to treatment, discussions with primary provider, discussions  with consultants and development of treatment plan with patient or surrogate    Critical Care performed: yes ____________________________________________   INITIAL IMPRESSION / ASSESSMENT AND PLAN / ED COURSE  Pertinent labs & imaging results that were available during my care of the patient were reviewed by me and considered in my medical decision making (see chart for details).   DDX: Asthma, copd, CHF, pna, ptx, malignancy, Pe, anemia   Ian Herman is a 74 y.o. who presents to the ED with acute respiratory failure with hypoxia requiring BiPAP.  Patient febrile tachycardic septic work-up ordered.  Will give IV fluids as well as start broad-spectrum antibiotics.  Will send for viral studies.  Will give Solu-Medrol as well as DuoNeb.  Clinical Course as of 04/26/21 1847  Tue Apr 26, 2021  1622 Appears more comfortable on BiPAP. [PR]  1641 Chest x-ray does not appear consistent with acute CHF. [PR]  1704 Lactate is mildly elevated I suspect this is more likely related to his hypoxia the patient is receiving IV fluids for sepsis is not hypotensive with his history of CHF we will give IV fluids and continue to reassess.  Due to his acuity will moved to the main side of our ER.  He is can  require admission to the hospital. [PR]  1742 Patient has flu positive.  He is feeling anxious and pulling at his BiPAP.  Given his work of breathing would like to observe him on BiPAP a little bit longer will give dose of Ativan to calm him.  He is redirectable and agreeable [PR]    Clinical Course User Index [PR] Willy Eddy, MD    The patient was evaluated in Emergency Department today for the symptoms described in the history of present illness. He/she was evaluated in the context of the global COVID-19 pandemic, which necessitated consideration that the patient might be at risk for infection with the SARS-CoV-2 virus that causes COVID-19. Institutional protocols and algorithms that pertain  to the evaluation of patients at risk for COVID-19 are in a state of rapid change based on information released by regulatory bodies including the CDC and federal and state organizations. These policies and algorithms were followed during the patient's care in the ED.  As part of my medical decision making, I reviewed the following data within the electronic MEDICAL RECORD NUMBER Nursing notes reviewed and incorporated, Labs reviewed, notes from prior ED visits and [place] Controlled Substance Database   ____________________________________________   FINAL CLINICAL IMPRESSION(S) / ED DIAGNOSES  Final diagnoses:  Acute respiratory failure with hypoxia (HCC)  Influenza      NEW MEDICATIONS STARTED DURING THIS VISIT:  New Prescriptions   No medications on file     Note:  This document was prepared using Dragon voice recognition software and may include unintentional dictation errors.    Willy Eddy, MD 04/26/21 [id number]

## 2021-04-26 NOTE — H&P (Addendum)
History and Physical  Ian Herman HMC:[id number] DOB: 21-Dec-1945 DOA: 04/26/2021  Referring physician: Merlyn Lot, MD PCP: Benito Mccreedy, MD  Patient coming from: Home  Chief Complaint: Shortness of breath  HPI: Ian Herman is a 75 y.o. male with medical history significant for COPD not on home oxygen, CHF, essential hypertension, GERD, mixed hyperlipidemia and tobacco use who presents to the emergency department due to 2-day onset of increasing work of breathing with worsening shortness of breath.  Patient states that home medication did not improve his shortness of breath and was unable to provide further history due to worsening shortness of breath.  History was obtained from ED physician and ED medical record.  Per report, patient arrives in respiratory distress, breathing treatment was provided by EMS team en routes to the ED.  On arrival to the ED, he was noted to be hypoxic in the low 80s, was febrile and tachycardic.  Supplemental oxygen was provided.  ED Course:  In the emergency department, he was febrile with a temperature of 100.16F, he was tachycardic and tachypneic.  BP was 192/125.  Work-up in the ED showed normal CBC except for thrombocytopenia, hyponatremia, BNP 246.8, lactic acid 2.0 > 2.2, initial ABG showed pH 7.34, PCO2 59, PO2 was pending and bicarb was 31.8.  Subsequent ABG showed pH 7.43, PCO2 37, PO2 106, bicarb 24.6 at FiO2 of 40%.  Troponin x2-27 > 36.  Urinalysis was unimpressive for UTI.  Influenza A was positive.  Influenza B and SARS coronavirus 2 were negative. Chest x-ray showed no definite acute process.  Emphysema with bilateral lung scarring noted Patient was started on IV cefepime and azithromycin, breathing treatment was provided, Tamiflu was given, IV Ativan due to anxiety was provided.  Hospitalist was asked to admit patient for further evaluation and management.  Review of Systems: Constitutional: Negative for chills and fever.  HENT:  Negative for ear pain and sore throat.   Eyes: Negative for pain and visual disturbance.  Respiratory: Positive for cough, chest tightness and shortness of breath.   Cardiovascular: Negative for chest pain and palpitations.  Gastrointestinal: Negative for abdominal pain and vomiting.  Endocrine: Negative for polyphagia and polyuria.  Genitourinary: Negative for decreased urine volume, dysuria, enuresis Musculoskeletal: Negative for arthralgias and back pain.  Skin: Negative for color change and rash.  Lymphatics: No supraclavicular or axillary lymphadenopathy Allergic/Immunologic: Negative for immunocompromised state.  Neurological: Negative for tremors, syncope, speech difficulty Hematological: Does not bruise/bleed easily.  All other systems reviewed and are negative  Past Medical History:  Diagnosis Date   Arthritis    Bronchitis    CHF (congestive heart failure) (HCC)    COPD (chronic obstructive pulmonary disease) (HCC)    Coronary artery disease    Depression    GERD (gastroesophageal reflux disease)    Hepatitis C    Hypertension    Myocardial infarction acute (HCC)    Pleural effusion    PTSD (post-traumatic stress disorder)    Shortness of breath dyspnea    Past Surgical History:  Procedure Laterality Date   APPENDECTOMY     CORONARY ANGIOPLASTY     stent   INGUINAL HERNIA REPAIR Left 04/23/2015   Procedure: HERNIA REPAIR INGUINAL ADULT;  Surgeon: Leonie Green, MD;  Location: [facility] ORS;  Service: General;  Laterality: Left;    Social History:  reports that he has been smoking. He has been smoking an average of .5 packs per day. He has never used smokeless tobacco. He reports  current alcohol use. He reports that he does not use drugs.   Allergies  Allergen Reactions   Trazodone     Other reaction(s): OTHER REACTION, dystonia, OTHER REACTION, dystonia   Chlorpromazine Hives, Anxiety and Rash    Hives  Other reaction(s): Anxiety, AGITATION, Cramp, MUSCLE  SPASMS, OTHER REACTION, PARKINSONIAN-LIKE SYNDROME, UNKNOWN   Codeine Itching and Rash    itching Other reaction(s): HIVES, SPASISMS, UNKNOWN    Family History  Problem Relation Age of Onset   Lung cancer Sister      Prior to Admission medications   Medication Sig Start Date End Date Taking? Authorizing Provider  acetaminophen (TYLENOL) 325 MG tablet Take 2 tablets (650 mg total) by mouth every 6 (six) hours as needed for mild pain or fever. 03/24/21   Val Riles, MD  albuterol (ACCUNEB) 0.63 MG/3ML nebulizer solution Take 1 ampule by nebulization every 6 (six) hours as needed for wheezing.    [provider]  aspirin EC 81 MG tablet Take 81 mg by mouth daily.    [provider]  carboxymethylcellulose (REFRESH PLUS) 0.5 % SOLN Apply 1 drop to eye 3 (three) times daily as needed (dry eyes).    [provider]  fluticasone-salmeterol (ADVAIR) 250-50 MCG/ACT AEPB Inhale 1 puff into the lungs in the morning and at bedtime. 01/11/20   [provider]  guaiFENesin (MUCINEX) 600 MG 12 hr tablet Take 1 tablet by mouth daily. 01/28/21   [provider]  hydrALAZINE (APRESOLINE) 50 MG tablet Take 1 tablet by mouth in the morning and at bedtime. 01/28/21   [provider]  Ipratropium-Albuterol (COMBIVENT) 20-100 MCG/ACT AERS respimat Inhale 1 puff into the lungs every 6 (six) hours as needed for wheezing or shortness of breath.    [provider]  ipratropium-albuterol (DUONEB) 0.5-2.5 (3) MG/3ML SOLN Take 3 mLs by nebulization every 6 (six) hours for 3 days. 08/07/18 12/25/20  Dustin Flock, MD  lisinopril (ZESTRIL) 40 MG tablet Take 1 tablet by mouth daily. 01/28/21   [provider]  meloxicam (MOBIC) 15 MG tablet Take 1 tablet by mouth daily. 01/28/21   [provider]  metoprolol tartrate (LOPRESSOR) 50 MG tablet Take 1 tablet (50 mg total) by mouth 2 (two) times daily. 12/29/20   Patrecia Pour, MD  Nutritional  Supplements (ENSURE PLUS PO) Take 1 Can by mouth See admin instructions. Consume contents of 1 can every day separate from meals. Do not use as meal replacement. 01/27/20   [provider]  omeprazole (PRILOSEC) 20 MG capsule Take 1 capsule by mouth daily. 01/28/21   [provider]  oxyCODONE-acetaminophen (PERCOCET) 5-325 MG tablet Take 1 tablet by mouth every 4 (four) hours as needed for severe pain. 03/31/21 03/31/22  Blake Divine, MD  predniSONE (STERAPRED UNI-PAK 21 TAB) 10 MG (21) TBPK tablet 40 mg daily for 2 days, 30 mg daily for 2 days, 20 mg for 2 days, 10 mg for 3 days 03/24/21   Val Riles, MD  sildenafil (VIAGRA) 100 MG tablet Take 100 mg by mouth as needed for erectile dysfunction.    [provider]  simvastatin (ZOCOR) 20 MG tablet Take 20 mg by mouth every evening.    [provider]  tiZANidine (ZANAFLEX) 4 MG tablet Take 2 tablets by mouth at bedtime. 01/28/21   [provider]    Physical Exam: BP (!) 139/102   Pulse (!) 158   Temp (!) 100.9 F (38.3 C) (Axillary)   Resp Marland Kitchen)  34   Wt 54.4 kg   SpO2 100%   BMI 18.25 kg/m   General: 75 y.o. year-old male cachectic in acute distress.  Alert and oriented x3. HEENT: NCAT, EOMI Neck: Supple, trachea medial Cardiovascular: Regular rate and rhythm with no rubs or gallops.  No thyromegaly or JVD noted.  No lower extremity edema. 2/4 pulses in all 4 extremities. Respiratory: Diffuse expiratory wheezes with accessory muscle use.   Abdomen: Soft, nontender nondistended with normal bowel sounds x4 quadrants. Muskuloskeletal: No cyanosis, clubbing or edema noted bilaterally Neuro: CN II-XII intact, strength 5/5 x 4, sensation, reflexes intact Skin: No ulcerative lesions noted or rashes Psychiatry: Mood is appropriate for condition and setting          Labs on Admission:  Basic Metabolic Panel: Recent Labs  Lab 04/26/21 1554 04/26/21 1555  NA  --  134*  K  --  4.2  CL  --   97*  CO2  --  28  GLUCOSE  --  123*  BUN  --  15  CREATININE  --  0.78  CALCIUM  --  8.9  MG 1.9  --    Liver Function Tests: Recent Labs  Lab 04/26/21 1555  AST 18  ALT 10  ALKPHOS 96  BILITOT 0.7  PROT 8.1  ALBUMIN 4.2   No results for input(s): LIPASE, AMYLASE in the last 168 hours. No results for input(s): AMMONIA in the last 168 hours. CBC: Recent Labs  Lab 04/26/21 1555  WBC 5.8  HGB 13.6  HCT 40.2  MCV 89.9  PLT 145*   Cardiac Enzymes: No results for input(s): CKTOTAL, CKMB, CKMBINDEX, TROPONINI in the last 168 hours.  BNP (last 3 results) Recent Labs    12/25/20 1125 03/22/21 0835 04/26/21 1554  BNP 90.3 36.0 246.8*    ProBNP (last 3 results) No results for input(s): PROBNP in the last 8760 hours.  CBG: No results for input(s): GLUCAP in the last 168 hours.  Radiological Exams on Admission: DG Chest Portable 1 View  Result Date: 04/26/2021 CLINICAL DATA:  Respiratory distress EXAM: PORTABLE CHEST 1 VIEW COMPARISON:  Radiograph 03/22/2021 FINDINGS: Unchanged cardiomediastinal silhouette. Emphysema with bilateral lung scarring. Unchanged pulmonary hyperinflation. No new airspace disease. Chronic bilateral costophrenic angle blunting. No visible pneumothorax. No acute osseous abnormality. IMPRESSION: No definite acute process.  Emphysema with bilateral lung scarring. Electronically Signed   By: Maurine Simmering M.D.   On: 04/26/2021 16:40    EKG: I independently viewed the EKG done and my findings are as followed: SVT at a rate of 133 bpm with ST and T wave abnormalities  Assessment/Plan Present on Admission:  Influenza A  Acute on chronic respiratory failure with hypoxia ([place])  Severe sepsis ([place])  Tobacco abuse  COPD exacerbation ([place])  Coronary artery disease  Principal Problem:   Influenza A Active Problems:   COPD exacerbation ([place])   Acute on chronic respiratory failure with hypoxia (HCC)   Coronary artery disease   Essential  hypertension   Mixed hyperlipidemia   Severe sepsis (HCC)   Tobacco abuse   Elevated brain natriuretic peptide (BNP) level   Elevated troponin   Lactic acidosis   Hyponatremia   GERD (gastroesophageal reflux disease)   Hypertensive urgency  Severe sepsis secondary to influenza A Acute on chronic respiratory failure with hypoxia secondary to acute exacerbation of COPD in the setting of above Patient was febrile, tachypneic, tachycardic (met SIRS criteria), he was noted to have influenza A IV hydration was  provided Continue Xopenex, Mucinex, Solu-Medrol, azithromycin, Dulera, Tamiflu. IV magnesium 2 mg x 1 was given Continue Protonix to prevent steroid-induced ulcer Continue incentive spirometry and flutter valve Continue IV morphine every 2 hours as needed for RR > 30 Continue BiPAP with goal to wean patient off days and transition to supplemental oxygen to maintain O2 sat > 92% with eventual plan to wean patient off oxygen as tolerated (patient does not use oxygen at baseline)  Lactic acidosis possibly secondary to influenza A and hypoxia Lactic acid 2.0 > 2.2 > 2.2, IV hydration was provided Continue to trend lactic acid  Elevated BNP rule out CHF BNP 246.8. Chest x-ray personally reviewed showed bilateral costophrenic angle blunting which appears to be chronic Patient had no lower extremity edema, JVD.  He denied orthopnea.  He endorsed that shortness of breath was similar to prior episodes of COPD exacerbation Continue total input/output, daily weights and fluid restriction Continue Cardiac diet  EKG personally reviewed showed sinus tachycardia at a rate of 105 bpm Echocardiogram will be done in the morning  Consider cardiology consult based on findings on echo  Elevated troponin possibly secondary to type II demand ischemia Troponin x 2 -27 > 36, patient denies chest pain Continue to trend troponin.  Continue telemetry Consider cardiology consult if troponin continues to  increase with worsening clinical presentation  Thrombocytopenia possibly reactive Platelets 145, continue to monitor platelet levels with morning labs  Hypertensive urgency Essential hypertension (uncontrolled) This was possibly due to patient's respiratory distress in the setting of COPD exacerbation Continue home hydralazine, lisinopril, Lopressor  Hyponatremia Na 134, continue IV hydration  CAD Continue aspirin, Lopressor and Zocor  Mixed hyperlipidemia Continue Zocor  GERD Continue Protonix   DVT prophylaxis: Lovenox  Code Status: Full code  Family Communication: None at bedside  Disposition Plan:  Patient is from:                        home Anticipated DC to:                   SNF or family members home Anticipated DC date:               2-3 days Anticipated DC barriers:          Requires inpatient management due to sepsis secondary to influenza A with superimposed COPD exacerbation on BiPAP  Consults called: None  Admission status: Inpatient    Bernadette Hoit MD [facility]  04/26/2021, 9:14 PM

## 2021-04-26 NOTE — Sepsis Progress Note (Signed)
Notified provider of need to order repeat lactic acid. ° °

## 2021-04-26 NOTE — ED Notes (Signed)
Pt trying to get out of bed. RN helped pt back into bed. Pt HR increased to 150s. Pt educated on staying in the bed and using the call bell if he needs help. Male external cath had come off. RN changed sheets and male external cath. Pt resting in the bed at this time.

## 2021-04-26 NOTE — Consult Note (Signed)
PHARMACY -  BRIEF ANTIBIOTIC NOTE   Pharmacy has received consult(s) for Cefepime from an ED provider.  The patient's profile has been reviewed for ht/wt/allergies/indication/available labs.    One time order(s) placed for Cefepime 2g IV x 1 dose.  Further antibiotics/pharmacy consults should be ordered by admitting physician if indicated.                       Thank you, Bettey Costa 04/26/2021  4:02 PM

## 2021-04-27 DIAGNOSIS — J101 Influenza due to other identified influenza virus with other respiratory manifestations: Secondary | ICD-10-CM | POA: Diagnosis not present

## 2021-04-27 LAB — CBC
HCT: 40.4 % (ref 39.0–52.0)
Hemoglobin: 13.5 g/dL (ref 13.0–17.0)
MCH: 29.8 pg (ref 26.0–34.0)
MCHC: 33.4 g/dL (ref 30.0–36.0)
MCV: 89.2 fL (ref 80.0–100.0)
Platelets: 152 10*3/uL (ref 150–400)
RBC: 4.53 MIL/uL (ref 4.22–5.81)
RDW: 13 % (ref 11.5–15.5)
WBC: 7.3 10*3/uL (ref 4.0–10.5)
nRBC: 0 % (ref 0.0–0.2)

## 2021-04-27 LAB — COMPREHENSIVE METABOLIC PANEL
ALT: 9 U/L (ref 0–44)
AST: 17 U/L (ref 15–41)
Albumin: 3.4 g/dL — ABNORMAL LOW (ref 3.5–5.0)
Alkaline Phosphatase: 74 U/L (ref 38–126)
Anion gap: 8 (ref 5–15)
BUN: 16 mg/dL (ref 8–23)
CO2: 26 mmol/L (ref 22–32)
Calcium: 8.2 mg/dL — ABNORMAL LOW (ref 8.9–10.3)
Chloride: 101 mmol/L (ref 98–111)
Creatinine, Ser: 0.73 mg/dL (ref 0.61–1.24)
GFR, Estimated: >60 mL/min (ref >60)
Glucose, Bld: 146 mg/dL — ABNORMAL HIGH (ref 70–99)
Potassium: 4.4 mmol/L (ref 3.5–5.1)
Sodium: 135 mmol/L (ref 135–145)
Total Bilirubin: 0.8 mg/dL (ref 0.3–1.2)
Total Protein: 6.5 g/dL (ref 6.5–8.1)

## 2021-04-27 LAB — MAGNESIUM: Magnesium: 2.2 mg/dL (ref 1.7–2.4)

## 2021-04-27 LAB — LACTIC ACID, PLASMA: Lactic Acid, Venous: 1.8 mmol/L (ref 0.5–1.9)

## 2021-04-27 LAB — PHOSPHORUS: Phosphorus: 3.9 mg/dL (ref 2.5–4.6)

## 2021-04-27 LAB — APTT: aPTT: 50 seconds — ABNORMAL HIGH (ref 24–36)

## 2021-04-27 LAB — GLUCOSE, CAPILLARY: Glucose-Capillary: 134 mg/dL — ABNORMAL HIGH (ref 70–99)

## 2021-04-27 LAB — TROPONIN I (HIGH SENSITIVITY): Troponin I (High Sensitivity): 53 ng/L — ABNORMAL HIGH (ref <18)

## 2021-04-27 MED ORDER — CHLORHEXIDINE GLUCONATE CLOTH 2 % EX PADS
6.0000 | MEDICATED_PAD | Freq: Every day | CUTANEOUS | Status: DC
  Administered 2021-04-27 – 2021-04-30 (×4): 6 via TOPICAL

## 2021-04-27 MED ORDER — OSELTAMIVIR PHOSPHATE 75 MG PO CAPS
75.0000 mg | ORAL_CAPSULE | Freq: Two times a day (BID) | ORAL | Status: DC
  Administered 2021-04-27 – 2021-04-30 (×7): 75 mg via ORAL
  Filled 2021-04-27 (×8): qty 1

## 2021-04-27 MED ORDER — METOPROLOL TARTRATE 5 MG/5ML IV SOLN
2.5000 mg | Freq: Once | INTRAVENOUS | Status: AC
Start: 2021-04-27 — End: 2021-04-27
  Administered 2021-04-27: 2.5 mg via INTRAVENOUS
  Filled 2021-04-27: qty 5

## 2021-04-27 MED ORDER — LISINOPRIL 20 MG PO TABS
40.0000 mg | ORAL_TABLET | Freq: Every day | ORAL | Status: DC
  Administered 2021-04-27 – 2021-04-30 (×4): 40 mg via ORAL
  Filled 2021-04-27 (×4): qty 2

## 2021-04-27 MED ORDER — HYDRALAZINE HCL 50 MG PO TABS
50.0000 mg | ORAL_TABLET | Freq: Two times a day (BID) | ORAL | Status: DC
  Administered 2021-04-27 – 2021-04-30 (×7): 50 mg via ORAL
  Filled 2021-04-27 (×7): qty 1

## 2021-04-27 MED ORDER — HYDRALAZINE HCL 50 MG PO TABS: 50.0000 mg | ORAL_TABLET | Freq: Two times a day (BID) | ORAL | Status: DC

## 2021-04-27 MED ORDER — MOMETASONE FURO-FORMOTEROL FUM 200-5 MCG/ACT IN AERO
2.0000 | INHALATION_SPRAY | Freq: Two times a day (BID) | RESPIRATORY_TRACT | Status: DC
  Administered 2021-04-27 – 2021-04-30 (×7): 2 via RESPIRATORY_TRACT
  Filled 2021-04-27: qty 8.8

## 2021-04-27 MED ORDER — ASPIRIN EC 81 MG PO TBEC
81.0000 mg | DELAYED_RELEASE_TABLET | Freq: Every day | ORAL | Status: DC
  Administered 2021-04-27 – 2021-04-30 (×4): 81 mg via ORAL
  Filled 2021-04-27 (×4): qty 1

## 2021-04-27 MED ORDER — SIMVASTATIN 20 MG PO TABS: 20.0000 mg | ORAL_TABLET | Freq: Every evening | ORAL | Status: DC

## 2021-04-27 MED ORDER — AMIODARONE IV BOLUS ONLY 150 MG/100ML: 150.0000 mg | Freq: Once | INTRAVENOUS | Status: DC

## 2021-04-27 MED ORDER — METOPROLOL TARTRATE 50 MG PO TABS
50.0000 mg | ORAL_TABLET | Freq: Two times a day (BID) | ORAL | Status: DC
  Administered 2021-04-27 – 2021-04-30 (×7): 50 mg via ORAL
  Filled 2021-04-27 (×7): qty 1

## 2021-04-27 NOTE — TOC Initial Note (Signed)
Transition of Care [facility]) - Initial/Assessment Note    Patient Details  Name: Ian Herman MRN: [id number] Date of Birth: 10-31-1945  [facility] CM/SW Contact:    Hetty Ely, RN Phone Number: 04/27/2021, 3:42 PM  Clinical Narrative: Patient NMS, attempted to call friend to assist with TOC assessment, no answer left voice message to return call.                       Patient Goals and CMS Choice        Expected Discharge Plan and Services                                                Prior Living Arrangements/Services                       Activities of Daily Living      Permission Sought/Granted                  Emotional Assessment              Admission diagnosis:  Influenza A [J10.1] Acute respiratory failure with hypoxia (HCC) [J96.01] Influenza [J11.1] Patient Active Problem List   Diagnosis Date Noted   Influenza A 04/26/2021   Elevated brain natriuretic peptide (BNP) level 04/26/2021   Elevated troponin 04/26/2021   Lactic acidosis 04/26/2021   Hyponatremia 04/26/2021   GERD (gastroesophageal reflux disease) 04/26/2021   Hypertensive urgency 04/26/2021   RUQ abdominal pain 03/22/2021   Chest pain 03/22/2021   Gallstone 03/22/2021   Acute on chronic respiratory failure with hypoxia (HCC) 12/25/2020   Coronary artery disease    Depression    Essential hypertension    Mixed hyperlipidemia    CAP (community acquired pneumonia)    Severe sepsis (HCC)    Tobacco abuse    Chronic diastolic CHF (congestive heart failure) (HCC)    COPD exacerbation (HCC)    Anxiety state    Advanced care planning/counseling discussion    Goals of care, counseling/discussion    Palliative care by specialist    Acute on chronic respiratory failure with hypoxia and hypercapnia (HCC) 08/02/2018   Protein-calorie malnutrition, severe 08/02/2018   PCP:  Louanne Skye, MD Pharmacy:   [facility] [address] ([address]), [place] - [address] [place] ([place]) [place] [address] Phone: [phone] Fax: [phone]  [facility] - [place], [place] - [address] [place] [address] Phone: [phone] Fax: [phone]     Social Determinants of Health (SDOH) Interventions    Readmission Risk Interventions No flowsheet data found.

## 2021-04-27 NOTE — Progress Notes (Signed)
PROGRESS NOTE    Ian Herman   ZOX:[id number]  DOB: 08-01-1945  PCP: Louanne Skye, MD    DOA: 04/26/2021 LOS: 1    Brief Narrative / Hospital Course to Date:   75 y.o. male with medical history significant for COPD not on home oxygen, essential hypertension, GERD, hyperlipidemia and tobacco abuse who presented to the ED on 04/26/21 with complaints of 2-days progressively worsening shortness of breath and significantly increased work of breathing, not improved with his home inhalers.    In the ED, pt in acute respiratory distress, placed on BiPAP, febrile and tachycardic.  Tested positive for Influenza A.  Admitted to stepdown on [facility] service, remains on BiPAP.  Assessment & Plan   Principal Problem:   Influenza A Active Problems:   COPD exacerbation (HCC)   Acute on chronic respiratory failure with hypoxia (HCC)   Coronary artery disease   Essential hypertension   Mixed hyperlipidemia   Severe sepsis (HCC)   Tobacco abuse   Elevated brain natriuretic peptide (BNP) level   Elevated troponin   Lactic acidosis   Hyponatremia   GERD (gastroesophageal reflux disease)   Hypertensive urgency   Severe sepsis secondary to influenza A Acute respiratory failure with hypoxia due to Acute exacerbation of COPD (likely triggered by fluid) Patient presented with fever, tachypnea and tachycardia in the setting of influenza A infection.  Lactic acidosis reflecting organ dysfunction and severe sepsis. Sepsis physiology improving and lactic acidosis resolved. --Continue Tamiflu --Continue IV steroids and Zithromax --Continue BiPAP as needed --Supplemental oxygen to maintain sats above 90% (wean as tolerated, patient not on oxygen at baseline) -- Continue Xopenex, Dulera  -- Mucinex, Protonix for GI protection with IV steroids --IV morphine as needed for tachypnea --Incentive spirometry and flutter valve  Lactic acidosis -due to severe sepsis and hypoxia. Presented with  lactic acid 2.0, increased to 2.2 but normalized with IV fluids.  Elevated BNP -prior echo reviewed and had normal EF and normal diastolic parameters.  Presented with BNP to 46.8.  Chest x-ray without overt pulmonary edema.  No other physical exam findings to suggest volume overload including no lower extremity edema or JVD. Patient reported shortness of breath consistent with prior COPD exacerbations. --Need to rule out CHF --Follow-up pending echo --I/O's and daily weights --Heart healthy diet  Elevated troponin due to demand ischemia -trend essentially flat and patient without active chest pain.  No acute ischemic changes on EKG. --Stat troponin and EKG if patient develops chest pain --Monitor on telemetry  Thrombocytopenia -suspect due to sepsis.  Platelets 145k on admission. --Monitor CBC  Hypertensive urgency /history of essential hypertension, uncontrolled -suspect BP severely elevated due to anxiety in the setting of respiratory distress. -- Continue home hydralazine, lisinopril and Lopressor --Monitor BP closely  Mild hyponatremia -presented with sodium 134. --IV fluids and monitor BMP  CAD -stable without active chest pain or evidence of ischemia. --Continue aspirin, Lopressor and Zocor  Mixed hyperlipidemia -continue Zocor  GERD -continue PPI  Patient BMI: Body mass index is 17.4 kg/m.   DVT prophylaxis: Place and maintain sequential compression device Start: 04/27/21 1551 enoxaparin (LOVENOX) injection 40 mg Start: 04/26/21 2100 SCDs Start: 04/26/21 2001   Diet:  Diet Orders (From admission, onward)     Start     Ordered   04/26/21 2001  Diet Heart Room service appropriate? Yes; Fluid consistency: Thin  Diet effective now       Question Answer Comment  Room service appropriate? Yes   Fluid  consistency: Thin      04/26/21 2000              Code Status: Full Code   Subjective 04/27/21    Patient seen in ICU this morning.  He was sleeping and remains  on BiPAP.  He woke easily to voice.  He reported tolerating BiPAP well and feels his breathing is improved.  Denies any other acute complaints at this time other than being very tired.   Disposition Plan & Communication   Status is: Inpatient  Remains inpatient appropriate because: Remains on BiPAP and IV steroids   Consults, Procedures, Significant Events   Consultants:  None  Procedures:   None  Antimicrobials:  Anti-infectives (From admission, onward)    Start     Dose/Rate Route Frequency Ordered Stop   04/28/21 1000  azithromycin (ZITHROMAX) tablet 250 mg       See Hyperspace for full Linked Orders Report.   250 mg Oral Daily 04/26/21 2117 04/16/2021 0959   04/27/21 1445  oseltamivir (TAMIFLU) capsule 75 mg        75 mg Oral 2 times daily 04/27/21 1349 04/27/2021 0959   04/27/21 1000  azithromycin (ZITHROMAX) tablet 500 mg       See Hyperspace for full Linked Orders Report.   500 mg Oral Daily 04/26/21 2117 04/27/21 0946   04/26/21 1745  oseltamivir (TAMIFLU) capsule 75 mg  Status:  Discontinued        75 mg Oral  Once 04/26/21 1737 04/27/21 1349   04/26/21 1600  ceFEPIme (MAXIPIME) 2 g in sodium chloride 0.9 % 100 mL IVPB        2 g 200 mL/hr over 30 Minutes Intravenous  Once 04/26/21 1557 04/26/21 1656   04/26/21 1600  azithromycin (ZITHROMAX) 500 mg in sodium chloride 0.9 % 250 mL IVPB        500 mg 250 mL/hr over 60 Minutes Intravenous  Once 04/26/21 1557 04/26/21 1800         Micro    Objective   Vitals:   04/27/21 1700 04/27/21 1706 04/27/21 1800 04/27/21 2000  BP: 118/69  132/66 134/76  Pulse: (!) 58  (!) 54 (!) 54  Resp: 20  19 19   Temp:    97.8 F (36.6 C)  TempSrc:    Axillary  SpO2: 100% 98% 100% 100%  Weight:        Intake/Output Summary (Last 24 hours) at 04/27/2021 2023 Last data filed at 04/27/2021 1700 Gross per 24 hour  Intake 695 ml  Output 500 ml  Net 195 ml   Filed Weights   04/26/21 1543 04/27/21 0319  Weight: 54.4 kg 51.9 kg     Physical Exam:  General exam: Sleeping, woke easily to voice, no acute distress HEENT: Wearing BiPAP mask, hearing grossly normal  Respiratory system: Severely diminished breath sounds and faint expiratory wheezes throughout, mildly increased respiratory effort, on BiPAP. Cardiovascular system: normal S1/S2, RRR, no pedal edema.   Gastrointestinal system: soft, nontender abdomen. Central nervous system: no gross focal neurologic deficits, normal speech Extremities: No cyanosis, normal tone Skin: dry, intact, normal temperature Psychiatry: normal mood, congruent affect, judgement and insight appear normal  Labs   Data Reviewed: I have personally reviewed following labs and imaging studies  CBC: Recent Labs  Lab 04/26/21 1555 04/27/21 0400  WBC 5.8 7.3  HGB 13.6 13.5  HCT 40.2 40.4  MCV 89.9 89.2  PLT 145* 152   Basic Metabolic Panel: Recent Labs  Lab 04/26/21 1554 04/26/21 1555 04/27/21 0400  NA  --  134* 135  K  --  4.2 4.4  CL  --  97* 101  CO2  --  28 26  GLUCOSE  --  123* 146*  BUN  --  15 16  CREATININE  --  0.78 0.73  CALCIUM  --  8.9 8.2*  MG 1.9  --  2.2  PHOS  --   --  3.9   GFR: Estimated Creatinine Clearance: 60.4 mL/min (by C-G formula based on SCr of 0.73 mg/dL). Liver Function Tests: Recent Labs  Lab 04/26/21 1555 04/27/21 0400  AST 18 17  ALT 10 9  ALKPHOS 96 74  BILITOT 0.7 0.8  PROT 8.1 6.5  ALBUMIN 4.2 3.4*   No results for input(s): LIPASE, AMYLASE in the last 168 hours. No results for input(s): AMMONIA in the last 168 hours. Coagulation Profile: Recent Labs  Lab 04/26/21 1555  INR 1.0   Cardiac Enzymes: No results for input(s): CKTOTAL, CKMB, CKMBINDEX, TROPONINI in the last 168 hours. BNP (last 3 results) No results for input(s): PROBNP in the last 8760 hours. HbA1C: No results for input(s): HGBA1C in the last 72 hours. CBG: Recent Labs  Lab 04/27/21 0301  GLUCAP 134*   Lipid Profile: No results for input(s):  CHOL, HDL, LDLCALC, TRIG, CHOLHDL, LDLDIRECT in the last 72 hours. Thyroid Function Tests: No results for input(s): TSH, T4TOTAL, FREET4, T3FREE, THYROIDAB in the last 72 hours. Anemia Panel: No results for input(s): VITAMINB12, FOLATE, FERRITIN, TIBC, IRON, RETICCTPCT in the last 72 hours. Sepsis Labs: Recent Labs  Lab 04/26/21 1555 04/26/21 1800 04/26/21 2035 04/27/21 0000  LATICACIDVEN 2.0* 2.2* 2.2* 1.8    Recent Results (from the past 240 hour(s))  Resp Panel by RT-PCR (Flu A&B, Covid) Nasopharyngeal Swab     Status: Abnormal   Collection Time: 04/26/21  3:51 PM   Specimen: Nasopharyngeal Swab; Nasopharyngeal(NP) swabs in vial transport medium  Result Value Ref Range Status   SARS Coronavirus 2 by RT PCR NEGATIVE NEGATIVE Final    Comment: (NOTE) SARS-CoV-2 target nucleic acids are NOT DETECTED.  The SARS-CoV-2 RNA is generally detectable in upper respiratory specimens during the acute phase of infection. The lowest concentration of SARS-CoV-2 viral copies this assay can detect is 138 copies/mL. A negative result does not preclude SARS-Cov-2 infection and should not be used as the sole basis for treatment or other patient management decisions. A negative result may occur with  improper specimen collection/handling, submission of specimen other than nasopharyngeal swab, presence of viral mutation(s) within the areas targeted by this assay, and inadequate number of viral copies(<138 copies/mL). A negative result must be combined with clinical observations, patient history, and epidemiological information. The expected result is Negative.  Fact Sheet for Patients:  BloggerCourse.com  Fact Sheet for Healthcare Providers:  SeriousBroker.it  This test is no t yet approved or cleared by the [place] FDA and  has been authorized for detection and/or diagnosis of SARS-CoV-2 by FDA under an Emergency Use Authorization  (EUA). This EUA will remain  in effect (meaning this test can be used) for the duration of the COVID-19 declaration under Section 564(b)(1) of the Act, 21 U.S.C.section 360bbb-3(b)(1), unless the authorization is terminated  or revoked sooner.       Influenza A by PCR POSITIVE (A) NEGATIVE Final   Influenza B by PCR NEGATIVE NEGATIVE Final    Comment: (NOTE) The Xpert Xpress SARS-CoV-2/FLU/RSV plus assay is intended as an  aid in the diagnosis of influenza from Nasopharyngeal swab specimens and should not be used as a sole basis for treatment. Nasal washings and aspirates are unacceptable for Xpert Xpress SARS-CoV-2/FLU/RSV testing.  Fact Sheet for Patients: BloggerCourse.com  Fact Sheet for Healthcare Providers: SeriousBroker.it  This test is not yet approved or cleared by the [place] FDA and has been authorized for detection and/or diagnosis of SARS-CoV-2 by FDA under an Emergency Use Authorization (EUA). This EUA will remain in effect (meaning this test can be used) for the duration of the COVID-19 declaration under Section 564(b)(1) of the Act, 21 U.S.C. section 360bbb-3(b)(1), unless the authorization is terminated or revoked.  Performed at [facility], [address] [place], [place] [address]   Blood Culture (routine x 2)     Status: None (Preliminary result)   Collection Time: 04/26/21  4:10 PM   Specimen: BLOOD  Result Value Ref Range Status   Specimen Description BLOOD RIGHT ANTECUBITAL  Final   Special Requests   Final    BOTTLES DRAWN AEROBIC AND ANAEROBIC Blood Culture adequate volume   Culture   Final    NO GROWTH < 24 HOURS Performed at [facility], [address], [place], [place] [address]    Report Status PENDING  Incomplete  Blood Culture (routine x 2)     Status: None (Preliminary result)   Collection Time: 04/26/21  4:15 PM   Specimen: BLOOD  Result Value Ref Range Status    Specimen Description BLOOD LEFT ANTECUBITAL  Final   Special Requests   Final    BOTTLES DRAWN AEROBIC AND ANAEROBIC Blood Culture adequate volume   Culture   Final    NO GROWTH < 24 HOURS Performed at [facility], [address] [place], [place] [address]    Report Status PENDING  Incomplete      Imaging Studies   DG Chest Portable 1 View  Result Date: 04/26/2021 CLINICAL DATA:  Respiratory distress EXAM: PORTABLE CHEST 1 VIEW COMPARISON:  Radiograph 03/22/2021 FINDINGS: Unchanged cardiomediastinal silhouette. Emphysema with bilateral lung scarring. Unchanged pulmonary hyperinflation. No new airspace disease. Chronic bilateral costophrenic angle blunting. No visible pneumothorax. No acute osseous abnormality. IMPRESSION: No definite acute process.  Emphysema with bilateral lung scarring. Electronically Signed   By: Caprice Renshaw M.D.   On: 04/26/2021 16:40     Medications   Scheduled Meds:  aspirin EC  81 mg Oral Daily   [START ON 04/28/2021] azithromycin  250 mg Oral Daily   Chlorhexidine Gluconate Cloth  6 each Topical Daily   dextromethorphan-guaiFENesin  1 tablet Oral BID   enoxaparin (LOVENOX) injection  40 mg Subcutaneous Q24H   hydrALAZINE  50 mg Oral Q12H   levalbuterol  0.63 mg Nebulization Q6H   lisinopril  40 mg Oral Daily   methylPREDNISolone (SOLU-MEDROL) injection  40 mg Intravenous Q12H   metoprolol tartrate  50 mg Oral BID   mometasone-formoterol  2 puff Inhalation BID   oseltamivir  75 mg Oral BID   pantoprazole (PROTONIX) IV  40 mg Intravenous Q24H   Continuous Infusions:     LOS: 1 day    Time spent: 30 minutes    Pennie Banter, DO [facility]  04/27/2021, 8:23 PM      If 7PM-7AM, please contact night-coverage. How to contact the [facility] Attending or Consulting provider 7A - 7P or covering provider during after hours 7P -7A, for this patient?    Check the care team in [facility] and  look for a) attending/consulting [facility] provider  listed and b) the [facility] team listed Log into [email or web address] and use [facility] universal password to access. If you do not have the password, please contact the hospital operator. Locate the [facility] provider you are looking for under [facility] and page to a number that you can be directly reached. If you still have difficulty reaching the provider, please page the [facility] (Director on Call) for the Hospitalists listed on amion for assistance.

## 2021-04-28 ENCOUNTER — Inpatient Hospital Stay
Admit: 2021-04-28 | Discharge: 2021-04-28 | Disposition: A | Discharge status: Home / Self Care | Payer: No Typology Code available for payment source | Attending: Internal Medicine | Admitting: Internal Medicine

## 2021-04-28 DIAGNOSIS — J101 Influenza due to other identified influenza virus with other respiratory manifestations: Secondary | ICD-10-CM | POA: Diagnosis not present

## 2021-04-28 LAB — BASIC METABOLIC PANEL
Anion gap: 6 (ref 5–15)
BUN: 35 mg/dL — ABNORMAL HIGH (ref 8–23)
CO2: 29 mmol/L (ref 22–32)
Calcium: 8.4 mg/dL — ABNORMAL LOW (ref 8.9–10.3)
Chloride: 102 mmol/L (ref 98–111)
Creatinine, Ser: 0.79 mg/dL (ref 0.61–1.24)
GFR, Estimated: >60 mL/min (ref >60)
Glucose, Bld: 148 mg/dL — ABNORMAL HIGH (ref 70–99)
Potassium: 5 mmol/L (ref 3.5–5.1)
Sodium: 137 mmol/L (ref 135–145)

## 2021-04-28 LAB — CBC
HCT: 37.4 % — ABNORMAL LOW (ref 39.0–52.0)
Hemoglobin: 12.1 g/dL — ABNORMAL LOW (ref 13.0–17.0)
MCH: 29.4 pg (ref 26.0–34.0)
MCHC: 32.4 g/dL (ref 30.0–36.0)
MCV: 91 fL (ref 80.0–100.0)
Platelets: 150 10*3/uL (ref 150–400)
RBC: 4.11 MIL/uL — ABNORMAL LOW (ref 4.22–5.81)
RDW: 12.8 % (ref 11.5–15.5)
WBC: 7.9 10*3/uL (ref 4.0–10.5)
nRBC: 0 % (ref 0.0–0.2)

## 2021-04-28 LAB — ECHOCARDIOGRAM COMPLETE
Area-P 1/2: 3.63 cm2
Single Plane A4C EF: 38.9 %
Weight: 1844.81 oz

## 2021-04-28 MED ORDER — PANTOPRAZOLE SODIUM 40 MG PO TBEC
40.0000 mg | DELAYED_RELEASE_TABLET | Freq: Every day | ORAL | Status: DC
  Administered 2021-04-28 – 2021-04-29 (×2): 40 mg via ORAL
  Filled 2021-04-28 (×2): qty 1

## 2021-04-28 NOTE — Progress Notes (Signed)
*  PRELIMINARY RESULTS* Echocardiogram 2D Echocardiogram has been performed.  Joanette Gula Dorthia Tout 04/28/2021, 9:11 AM

## 2021-04-28 NOTE — Evaluation (Signed)
Physical Therapy Evaluation Patient Details Name: Ian Herman MRN: [id number] DOB: [birth year] Today's Date: 04/28/2021  History of Present Illness  Ian Herman is a 75 y.o. male with medical history significant for COPD not on home oxygen, CHF, essential hypertension, GERD, mixed hyperlipidemia and tobacco use who presents to the emergency department due to 2-day onset of increasing work of breathing with worsening shortness of breath.   Clinical Impression  Patient received in bed, he is alert, oriented. Does not want to do much as far as mobility goes. He declines sitting up in recliner. Agreed to stand and take a few steps at bedside. Patient is mod independent with bed mobility, transfers with min assist and ambulated 4 feet with SPC and min assist. O2 saturations remained in 90%s on 4 liters of O2.  He will continue to benefit from skilled PT while here to improve safety with mobility, strength and activity tolerance.            Recommendations for follow up therapy are one component of a multi-disciplinary discharge planning process, led by the attending physician.  Recommendations may be updated based on patient status, additional functional criteria and insurance authorization.  Follow Up Recommendations Home health PT    Assistance Recommended at Discharge Intermittent Supervision/Assistance  Functional Status Assessment Patient has had a recent decline in their functional status and demonstrates the ability to make significant improvements in function in a reasonable and predictable amount of time.  Equipment Recommendations  None recommended by PT    Recommendations for Other Services       Precautions / Restrictions Precautions Precautions: Fall Restrictions Weight Bearing Restrictions: No      Mobility  Bed Mobility Overal bed mobility: Modified Independent                  Transfers Overall transfer level: Needs assistance Equipment used:  Straight cane Transfers: Sit to/from Stand Sit to Stand: Min assist                Ambulation/Gait Ambulation/Gait assistance: Min assist Gait Distance (Feet): 4 Feet Assistive device: Straight cane Gait Pattern/deviations: Step-to pattern Gait velocity: decr     General Gait Details: patient able to take a few steps along edge of bed. Declined further ambulation at this time.  Stairs            Wheelchair Mobility    Modified Rankin (Stroke Patients Only)       Balance Overall balance assessment: Needs assistance Sitting-balance support: Feet supported Sitting balance-Leahy Scale: Good     Standing balance support: Single extremity supported;During functional activity Standing balance-Leahy Scale: Fair Standing balance comment: relies on single UE support                             Pertinent Vitals/Pain Pain Assessment: No/denies pain    Home Living Family/patient expects to be discharged to:: Private residence Living Arrangements: Alone   Type of Home: House Home Access: Level entry         Home Equipment: Cane - single point      Prior Function Prior Level of Function : Independent/Modified Independent             Mobility Comments: patient has both a walker and cane and uses each depending on how he feels. Uses walker for longer distances. Drives ADLs Comments: lives alone, independent     Hand Dominance  Extremity/Trunk Assessment   Upper Extremity Assessment Upper Extremity Assessment: Generalized weakness    Lower Extremity Assessment Lower Extremity Assessment: Generalized weakness    Cervical / Trunk Assessment Cervical / Trunk Assessment: Normal  Communication   Communication: No difficulties  Cognition Arousal/Alertness: Awake/alert Behavior During Therapy: WFL for tasks assessed/performed;Flat affect Overall Cognitive Status: Within Functional Limits for tasks assessed                                           General Comments      Exercises     Assessment/Plan    PT Assessment Patient needs continued PT services  PT Problem List Decreased strength;Decreased mobility;Decreased activity tolerance;Decreased balance;Cardiopulmonary status limiting activity;Decreased knowledge of precautions       PT Treatment Interventions DME instruction;Therapeutic activities;Therapeutic exercise;Patient/family education;Gait training;Functional mobility training;Balance training;Stair training    PT Goals (Current goals can be found in the Care Plan section)  Acute Rehab PT Goals Patient Stated Goal: to return home PT Goal Formulation: With patient Time For Goal Achievement: 05/11/21 Potential to Achieve Goals: Good    Frequency Min 2X/week   Barriers to discharge Decreased caregiver support      Co-evaluation               AM-PAC PT "6 Clicks" Mobility  Outcome Measure Help needed turning from your back to your side while in a flat bed without using bedrails?: None Help needed moving from lying on your back to sitting on the side of a flat bed without using bedrails?: A Little Help needed moving to and from a bed to a chair (including a wheelchair)?: A Little Help needed standing up from a chair using your arms (e.g., wheelchair or bedside chair)?: A Little Help needed to walk in hospital room?: A Lot Help needed climbing 3-5 steps with a railing? : A Lot 6 Click Score: 17    End of Session Equipment Utilized During Treatment: Oxygen Activity Tolerance: Patient limited by fatigue Patient left: in bed;with call bell/phone within reach Nurse Communication: Mobility status PT Visit Diagnosis: Unsteadiness on feet (R26.81);Muscle weakness (generalized) (M62.81);Difficulty in walking, not elsewhere classified (R26.2)    Time: 7253-6644 PT Time Calculation (min) (ACUTE ONLY): 18 min   Charges:   PT Evaluation $PT Eval Moderate Complexity: 1 Mod           Haniya Fern, PT, GCS 04/28/21,12:06 PM

## 2021-04-28 NOTE — Progress Notes (Signed)
PROGRESS NOTE    Ian Herman   POE:[id number]  DOB: 19-Feb-1946  PCP: Louanne Skye, MD    DOA: 04/26/2021 LOS: 2    Brief Narrative / Hospital Course to Date:   75 y.o. male with medical history significant for COPD not on home oxygen, essential hypertension, GERD, hyperlipidemia and tobacco abuse who presented to the ED on 04/26/21 with complaints of 2-days progressively worsening shortness of breath and significantly increased work of breathing, not improved with his home inhalers.    In the ED, pt in acute respiratory distress, placed on BiPAP, febrile and tachycardic.  Tested positive for Influenza A.  Admitted to stepdown on [facility] service, remains on BiPAP.  Assessment & Plan   Principal Problem:   Influenza A Active Problems:   COPD exacerbation (HCC)   Acute on chronic respiratory failure with hypoxia (HCC)   Coronary artery disease   Essential hypertension   Mixed hyperlipidemia   Severe sepsis (HCC)   Tobacco abuse   Elevated brain natriuretic peptide (BNP) level   Elevated troponin   Lactic acidosis   Hyponatremia   GERD (gastroesophageal reflux disease)   Hypertensive urgency   Severe sepsis secondary to influenza A Acute respiratory failure with hypoxia due to Acute exacerbation of COPD (likely triggered by fluid) Patient presented with fever, tachypnea and tachycardia in the setting of influenza A infection.  Lactic acidosis reflecting organ dysfunction and severe sepsis. Sepsis physiology improving and lactic acidosis resolved. 11/17: pt reports using O2 at home but without prescription, has a friend that gets tanks refilled --Continue Tamiflu --Continue IV steroids and Zithromax --Likely transition to Prednisone tomorrow --Assess for Home O2 --Continue BiPAP as needed --Supplemental oxygen to maintain sats above 90% (wean as tolerated, patient not on oxygen at baseline) -- Continue Xopenex, Dulera  -- Mucinex, Protonix for GI protection with  IV steroids --IV morphine as needed for tachypnea --Incentive spirometry and flutter valve  Lactic acidosis -due to severe sepsis and hypoxia. Presented with lactic acid 2.0, increased to 2.2 but normalized with IV fluids.  Elevated BNP -prior echo reviewed and had normal EF and normal diastolic parameters.  Presented with BNP 246.8.  Chest x-ray without overt pulmonary edema.   No other physical exam findings to suggest volume overload including no lower extremity edema or JVD.  Patient reported shortness of breath consistent with prior COPD exacerbations. Echo 11/17: LV function not assessed due to poor acoustic window, mild MR. Very low suspicion for CHF. --I/O's and daily weights --Heart healthy diet  Elevated troponin due to demand ischemia -trend essentially flat and patient without active chest pain.  No acute ischemic changes on EKG. --Stat troponin and EKG if patient develops chest pain --Monitor on telemetry  Thrombocytopenia -suspect due to sepsis.  Platelets 145k on admission. --Monitor CBC  Hypertensive urgency /history of essential hypertension, uncontrolled -suspect BP severely elevated due to anxiety in the setting of respiratory distress. -- Continue home hydralazine, lisinopril and Lopressor --Monitor BP closely  Mild hyponatremia -presented with sodium 134. --IV fluids and monitor BMP  CAD -stable without active chest pain or evidence of ischemia. --Continue aspirin, Lopressor and Zocor  Mixed hyperlipidemia -continue Zocor  GERD -continue PPI  Patient BMI: Body mass index is 17.53 kg/m.   DVT prophylaxis: Place and maintain sequential compression device Start: 04/27/21 1551 enoxaparin (LOVENOX) injection 40 mg Start: 04/26/21 2100 SCDs Start: 04/26/21 2001   Diet:  Diet Orders (From admission, onward)     Start  Ordered   04/26/21 2001  Diet Heart Room service appropriate? Yes; Fluid consistency: Thin  Diet effective now       Question Answer  Comment  Room service appropriate? Yes   Fluid consistency: Thin      04/26/21 2000              Code Status: Full Code   Subjective 04/28/21    Patient awake resting in bed. Reports feeling a bit better today, very weak in general and tired.  Ate breakfast, tolerated.  Reports ambulating with cane at home, lives alone with two cats.  No other acute complaints.  No acute events reported.    Disposition Plan & Communication   Status is: Inpatient  Remains inpatient appropriate because: Severity of illness on IV steroids.   Consults, Procedures, Significant Events   Consultants:  None  Procedures:   None  Antimicrobials:  Anti-infectives (From admission, onward)    Start     Dose/Rate Route Frequency Ordered Stop   04/28/21 1000  azithromycin (ZITHROMAX) tablet 250 mg       See Hyperspace for full Linked Orders Report.   250 mg Oral Daily 04/26/21 2117 05/09/2021 0959   04/27/21 1445  oseltamivir (TAMIFLU) capsule 75 mg        75 mg Oral 2 times daily 04/27/21 1349 05/09/2021 0959   04/27/21 1000  azithromycin (ZITHROMAX) tablet 500 mg       See Hyperspace for full Linked Orders Report.   500 mg Oral Daily 04/26/21 2117 04/27/21 0946   04/26/21 1745  oseltamivir (TAMIFLU) capsule 75 mg  Status:  Discontinued        75 mg Oral  Once 04/26/21 1737 04/27/21 1349   04/26/21 1600  ceFEPIme (MAXIPIME) 2 g in sodium chloride 0.9 % 100 mL IVPB        2 g 200 mL/hr over 30 Minutes Intravenous  Once 04/26/21 1557 04/26/21 1656   04/26/21 1600  azithromycin (ZITHROMAX) 500 mg in sodium chloride 0.9 % 250 mL IVPB        500 mg 250 mL/hr over 60 Minutes Intravenous  Once 04/26/21 1557 04/26/21 1800         Micro    Objective   Vitals:   04/28/21 1100 04/28/21 1148 04/28/21 1348 04/28/21 1600  BP: 140/65   131/66  Pulse: (!) 53   (!) 53  Resp: 19   19  Temp:      TempSrc:      SpO2: 99% 96% 99% 95%  Weight:        Intake/Output Summary (Last 24 hours) at  04/28/2021 1633 Last data filed at 04/28/2021 1600 Gross per 24 hour  Intake 480 ml  Output 1000 ml  Net -520 ml   Filed Weights   04/26/21 1543 04/27/21 0319 04/28/21 0220  Weight: 54.4 kg 51.9 kg 52.3 kg    Physical Exam:  General exam: Sleeping, woke easily to voice, no acute distress HEENT:  wearing nasal cannula, moist mucus membranes, hearing grossly normal  Respiratory system: poor air movement but improved wheezing, mildly increased respiratory effort, on 4 L/min [place] O2, barrel-shaped chest. Cardiovascular system: normal S1/S2, RRR, no pedal edema.   Central nervous system: no gross focal neurologic deficits, normal speech Skin: dry, intact, normal temperature Psychiatry: normal mood, congruent affect, judgement and insight appear normal  Labs   Data Reviewed: I have personally reviewed following labs and imaging studies  CBC: Recent Labs  Lab 04/26/21 1555  04/27/21 0400 04/28/21 0610  WBC 5.8 7.3 7.9  HGB 13.6 13.5 12.1*  HCT 40.2 40.4 37.4*  MCV 89.9 89.2 91.0  PLT 145* 152 150   Basic Metabolic Panel: Recent Labs  Lab 04/26/21 1554 04/26/21 1555 04/27/21 0400 04/28/21 0610  NA  --  134* 135 137  K  --  4.2 4.4 5.0  CL  --  97* 101 102  CO2  --  28 26 29   GLUCOSE  --  123* 146* 148*  BUN  --  15 16 35*  CREATININE  --  0.78 0.73 0.79  CALCIUM  --  8.9 8.2* 8.4*  MG 1.9  --  2.2  --   PHOS  --   --  3.9  --    GFR: Estimated Creatinine Clearance: 60.8 mL/min (by C-G formula based on SCr of 0.79 mg/dL). Liver Function Tests: Recent Labs  Lab 04/26/21 1555 04/27/21 0400  AST 18 17  ALT 10 9  ALKPHOS 96 74  BILITOT 0.7 0.8  PROT 8.1 6.5  ALBUMIN 4.2 3.4*   No results for input(s): LIPASE, AMYLASE in the last 168 hours. No results for input(s): AMMONIA in the last 168 hours. Coagulation Profile: Recent Labs  Lab 04/26/21 1555  INR 1.0   Cardiac Enzymes: No results for input(s): CKTOTAL, CKMB, CKMBINDEX, TROPONINI in the last 168  hours. BNP (last 3 results) No results for input(s): PROBNP in the last 8760 hours. HbA1C: No results for input(s): HGBA1C in the last 72 hours. CBG: Recent Labs  Lab 04/27/21 0301  GLUCAP 134*   Lipid Profile: No results for input(s): CHOL, HDL, LDLCALC, TRIG, CHOLHDL, LDLDIRECT in the last 72 hours. Thyroid Function Tests: No results for input(s): TSH, T4TOTAL, FREET4, T3FREE, THYROIDAB in the last 72 hours. Anemia Panel: No results for input(s): VITAMINB12, FOLATE, FERRITIN, TIBC, IRON, RETICCTPCT in the last 72 hours. Sepsis Labs: Recent Labs  Lab 04/26/21 1555 04/26/21 1800 04/26/21 2035 04/27/21 0000  LATICACIDVEN 2.0* 2.2* 2.2* 1.8    Recent Results (from the past 240 hour(s))  Resp Panel by RT-PCR (Flu A&B, Covid) Nasopharyngeal Swab     Status: Abnormal   Collection Time: 04/26/21  3:51 PM   Specimen: Nasopharyngeal Swab; Nasopharyngeal(NP) swabs in vial transport medium  Result Value Ref Range Status   SARS Coronavirus 2 by RT PCR NEGATIVE NEGATIVE Final    Comment: (NOTE) SARS-CoV-2 target nucleic acids are NOT DETECTED.  The SARS-CoV-2 RNA is generally detectable in upper respiratory specimens during the acute phase of infection. The lowest concentration of SARS-CoV-2 viral copies this assay can detect is 138 copies/mL. A negative result does not preclude SARS-Cov-2 infection and should not be used as the sole basis for treatment or other patient management decisions. A negative result may occur with  improper specimen collection/handling, submission of specimen other than nasopharyngeal swab, presence of viral mutation(s) within the areas targeted by this assay, and inadequate number of viral copies(<138 copies/mL). A negative result must be combined with clinical observations, patient history, and epidemiological information. The expected result is Negative.  Fact Sheet for Patients:  04/28/21  Fact Sheet for  Healthcare Providers:  BloggerCourse.com  This test is no t yet approved or cleared by the [place] FDA and  has been authorized for detection and/or diagnosis of SARS-CoV-2 by FDA under an Emergency Use Authorization (EUA). This EUA will remain  in effect (meaning this test can be used) for the duration of the COVID-19 declaration under Section 564(b)(1)  of the Act, 21 U.S.C.section 360bbb-3(b)(1), unless the authorization is terminated  or revoked sooner.       Influenza A by PCR POSITIVE (A) NEGATIVE Final   Influenza B by PCR NEGATIVE NEGATIVE Final    Comment: (NOTE) The Xpert Xpress SARS-CoV-2/FLU/RSV plus assay is intended as an aid in the diagnosis of influenza from Nasopharyngeal swab specimens and should not be used as a sole basis for treatment. Nasal washings and aspirates are unacceptable for Xpert Xpress SARS-CoV-2/FLU/RSV testing.  Fact Sheet for Patients: BloggerCourse.com  Fact Sheet for Healthcare Providers: SeriousBroker.it  This test is not yet approved or cleared by the [place] FDA and has been authorized for detection and/or diagnosis of SARS-CoV-2 by FDA under an Emergency Use Authorization (EUA). This EUA will remain in effect (meaning this test can be used) for the duration of the COVID-19 declaration under Section 564(b)(1) of the Act, 21 U.S.C. section 360bbb-3(b)(1), unless the authorization is terminated or revoked.  Performed at [facility], [address] [place], [place] [address]   Blood Culture (routine x 2)     Status: None (Preliminary result)   Collection Time: 04/26/21  4:10 PM   Specimen: BLOOD  Result Value Ref Range Status   Specimen Description BLOOD RIGHT ANTECUBITAL  Final   Special Requests   Final    BOTTLES DRAWN AEROBIC AND ANAEROBIC Blood Culture adequate volume   Culture   Final    NO GROWTH 2 DAYS Performed at [facility], [address] [place], [place] [address]    Report Status PENDING  Incomplete  Blood Culture (routine x 2)     Status: None (Preliminary result)   Collection Time: 04/26/21  4:15 PM   Specimen: BLOOD  Result Value Ref Range Status   Specimen Description BLOOD LEFT ANTECUBITAL  Final   Special Requests   Final    BOTTLES DRAWN AEROBIC AND ANAEROBIC Blood Culture adequate volume   Culture   Final    NO GROWTH 2 DAYS Performed at [facility], [address], [place], [place] [address]    Report Status PENDING  Incomplete      Imaging Studies   ECHOCARDIOGRAM COMPLETE  Result Date: 04/28/2021    ECHOCARDIOGRAM REPORT   Patient Name:   Ian Herman Date of Exam: 04/28/2021 Medical Rec #:  [id number]         Height:       68.0 in Accession #:    [id number]        Weight:       115.3 lb Date of Birth:  May 11, 1946        BSA:          1.617 m Patient Age:    74 years          BP:           140/80 mmHg Patient Gender: M                 HR:           53 bpm. Exam Location:  [facility] Procedure: 2D Echo, Limited Color Doppler and Cardiac Doppler Indications:     I50.31 congestive heart failure-Acute Diastolic  History:         Patient has no prior history of Echocardiogram examinations.                  CHF, COPD; Risk Factors:Current Smoker.  Sonographer:     Humphrey Rolls  Referring Phys:  [id number] Myrtha Mantis ADEFESO Diagnosing Phys: Sena Slate  Sonographer Comments: Technically challenging study due to limited acoustic windows. Image acquisition challenging due to COPD. IMPRESSIONS  1. Left ventricular function is difficult to assess on current study given poor acoustic window.  2. Right ventricular function is probably normal, but unable to accurately assess RV size.  3. The mitral valve is grossly normal. Mild mitral valve regurgitation. No evidence of mitral stenosis.  4. Aortic valve is calcified but appears to open normally. Unable to obtain velocities to assess for stenosis  or doppler for regurgitation.  5. The inferior vena cava is normal in size with greater than 50% respiratory variability, suggesting right atrial pressure of 3 mmHg. FINDINGS  Left Ventricle: Left ventricular function is difficult to assess on current study given poor acoustic window. Right Ventricle: Right ventricular function is probably normal, but unable to accurately assess RV size. Left Atrium: Left atrial size was not well visualized. Right Atrium: Right atrial size was not well visualized. Pericardium: There is no evidence of pericardial effusion. Mitral Valve: The mitral valve is grossly normal. Mild mitral valve regurgitation. No evidence of mitral valve stenosis. MV peak gradient, 3.0 mmHg. The mean mitral valve gradient is 1.0 mmHg. Tricuspid Valve: The tricuspid valve is normal in structure. Tricuspid valve regurgitation is not demonstrated. No evidence of tricuspid stenosis. Pulmonic Valve: The pulmonic valve was not well visualized. Pulmonic valve regurgitation is not visualized. No evidence of pulmonic stenosis. Aorta: Aortic valve is calcified but appears to open normally. Unable to obtain velocities to assess for stenosis or doppler for regurgitation. The aortic root was not well visualized. Venous: The inferior vena cava is normal in size with greater than 50% respiratory variability, suggesting right atrial pressure of 3 mmHg. IAS/Shunts: The interatrial septum was not assessed.   LV Volumes (MOD) LV vol d, MOD A4C: 47.0 ml LV vol s, MOD A4C: 28.7 ml LV SV MOD A4C:     47.0 ml PULMONIC VALVE PV Vmax:       0.83 m/s PV Vmean:      53.900 cm/s PV VTI:        0.136 m PV Peak grad:  2.8 mmHg PV Mean grad:  1.0 mmHg  MITRAL VALVE MV Area (PHT): 3.63 cm MV Peak grad:  3.0 mmHg MV Mean grad:  1.0 mmHg MV Vmax:       0.86 m/s MV Vmean:      52.2 cm/s MV Decel Time: 209 msec MV E velocity: 60.00 cm/s MV A velocity: 65.55 cm/s MV E/A ratio:  0.92 Sena Slate Electronically signed by Sena Slate Signature  Date/Time: 04/28/2021/12:41:12 PM    Final      Medications   Scheduled Meds:  aspirin EC  81 mg Oral Daily   azithromycin  250 mg Oral Daily   Chlorhexidine Gluconate Cloth  6 each Topical Daily   dextromethorphan-guaiFENesin  1 tablet Oral BID   enoxaparin (LOVENOX) injection  40 mg Subcutaneous Q24H   hydrALAZINE  50 mg Oral Q12H   levalbuterol  0.63 mg Nebulization Q6H   lisinopril  40 mg Oral Daily   methylPREDNISolone (SOLU-MEDROL) injection  40 mg Intravenous Q12H   metoprolol tartrate  50 mg Oral BID   mometasone-formoterol  2 puff Inhalation BID   oseltamivir  75 mg Oral BID   pantoprazole  40 mg Oral QHS   Continuous Infusions:     LOS: 2 days    Time spent: 25 minutes with >  50% spent at bedside and in coordination of care     Pennie Banter, DO [facility]  04/28/2021, 4:33 PM      If 7PM-7AM, please contact night-coverage. How to contact the [facility] Attending or Consulting provider 7A - 7P or covering provider during after hours 7P -7A, for this patient?    Check the care team in [facility] and look for a) attending/consulting [facility] provider listed and b) the [facility] team listed Log into [email or web address] and use [place] universal password to access. If you do not have the password, please contact the hospital operator. Locate the [facility] provider you are looking for under [facility] and page to a number that you can be directly reached. If you still have difficulty reaching the provider, please page the [facility] (Director on Call) for the Hospitalists listed on amion for assistance.

## 2021-04-28 NOTE — Progress Notes (Signed)
PHARMACIST - PHYSICIAN COMMUNICATION  CONCERNING: IV to Oral Route Change Policy  RECOMMENDATION: This patient is receiving pantoprazole by the intravenous route.  Based on criteria approved by the Pharmacy and Therapeutics Committee, the intravenous medication(s) is/are being converted to the equivalent oral dose form(s).   DESCRIPTION: These criteria include: The patient is eating (either orally or via tube) and/or has been taking other orally administered medications for a least 24 hours The patient has no evidence of active gastrointestinal bleeding or impaired GI absorption (gastrectomy, short bowel, patient on TNA or NPO).  If you have questions about this conversion, please contact the Pharmacy Department   Tressie Ellis, [facility] 04/28/2021 11:15 AM

## 2021-04-29 DIAGNOSIS — J101 Influenza due to other identified influenza virus with other respiratory manifestations: Secondary | ICD-10-CM | POA: Diagnosis not present

## 2021-04-29 LAB — BASIC METABOLIC PANEL
Anion gap: 7 (ref 5–15)
BUN: 34 mg/dL — ABNORMAL HIGH (ref 8–23)
CO2: 29 mmol/L (ref 22–32)
Calcium: 8.3 mg/dL — ABNORMAL LOW (ref 8.9–10.3)
Chloride: 99 mmol/L (ref 98–111)
Creatinine, Ser: 0.65 mg/dL (ref 0.61–1.24)
GFR, Estimated: >60 mL/min (ref >60)
Glucose, Bld: 133 mg/dL — ABNORMAL HIGH (ref 70–99)
Potassium: 4.8 mmol/L (ref 3.5–5.1)
Sodium: 135 mmol/L (ref 135–145)

## 2021-04-29 LAB — BLOOD GAS, VENOUS
Acid-Base Excess: 10.6 mmol/L — ABNORMAL HIGH (ref 0.0–2.0)
Bicarbonate: 35.1 mmol/L — ABNORMAL HIGH (ref 20.0–28.0)
O2 Saturation: 99.5 %
Patient temperature: 37
pCO2, Ven: 45 mmHg (ref 44.0–60.0)
pH, Ven: 7.5 — ABNORMAL HIGH (ref 7.250–7.430)
pO2, Ven: 158 mmHg — ABNORMAL HIGH (ref 32.0–45.0)

## 2021-04-29 MED ORDER — ORAL CARE MOUTH RINSE
15.0000 mL | Freq: Two times a day (BID) | OROMUCOSAL | Status: DC
  Administered 2021-04-30: 15 mL via OROMUCOSAL

## 2021-04-29 MED ORDER — ENSURE ENLIVE PO LIQD
237.0000 mL | Freq: Three times a day (TID) | ORAL | Status: DC
Start: 2021-04-29 — End: 2021-04-30
  Administered 2021-04-29 – 2021-04-30 (×3): 237 mL via ORAL

## 2021-04-29 MED ORDER — LEVALBUTEROL TARTRATE 45 MCG/ACT IN AERO
1.0000 | INHALATION_SPRAY | Freq: Four times a day (QID) | RESPIRATORY_TRACT | Status: DC | PRN
  Filled 2021-04-29: qty 15

## 2021-04-29 MED ORDER — ADULT MULTIVITAMIN W/MINERALS CH
1.0000 | ORAL_TABLET | Freq: Every day | ORAL | Status: DC
  Administered 2021-04-30: 1 via ORAL
  Filled 2021-04-29: qty 1

## 2021-04-29 MED ORDER — PREDNISONE 10 MG PO TABS
40.0000 mg | ORAL_TABLET | Freq: Every day | ORAL | Status: DC
  Administered 2021-04-30: 40 mg via ORAL
  Filled 2021-04-29: qty 4

## 2021-04-29 NOTE — Progress Notes (Addendum)
PROGRESS NOTE    Ian Herman   OJJ:[id number]  DOB: 11/20/1945  PCP: Louanne Skye, MD    DOA: 04/26/2021 LOS: 3    Brief Narrative / Hospital Course to Date:   75 y.o. male with medical history significant for COPD (on home oxygen but not prescribed to him), essential hypertension, GERD, hyperlipidemia and tobacco abuse who presented to the ED on 04/26/21 with complaints of 2-days progressively worsening shortness of breath and significantly increased work of breathing, not improved with his home inhalers.    In the ED, pt in acute respiratory distress, placed on BiPAP, febrile and tachycardic.  Tested positive for Influenza A.  Admitted to stepdown on [facility] service, remains on BiPAP.  Assessment & Plan   Principal Problem:   Influenza A Active Problems:   COPD exacerbation (HCC)   Acute on chronic respiratory failure with hypoxia (HCC)   Coronary artery disease   Essential hypertension   Mixed hyperlipidemia   Severe sepsis (HCC)   Tobacco abuse   Elevated brain natriuretic peptide (BNP) level   Elevated troponin   Lactic acidosis   Hyponatremia   GERD (gastroesophageal reflux disease)   Hypertensive urgency   Severe sepsis secondary to influenza A Acute on chronic respiratory failure with hypoxia due to Acute exacerbation of COPD (likely triggered by fluid) Patient presented with fever, tachypnea and tachycardia in the setting of influenza A infection.  Lactic acidosis reflecting organ dysfunction and severe sepsis. Sepsis physiology improving and lactic acidosis resolved. Pt reports using O2 at home but without prescription, has a friend that gets tanks refilled. Baseline O2 requirement unknown. --Continue Tamiflu --Continue IV steroids and Zithromax --Continue Prednisone  --Assess for Home O2 & Trilogy for home NIV  --Continue BiPAP QHS / as needed --Supplemental oxygen to maintain sats 88-93% (wean as tolerated, patient not prescribed oxygen but uses it  at home) -- Continue Xopenex, Dulera  -- Mucinex, Protonix for GI protection with IV steroids --IV morphine as needed for tachypnea --Incentive spirometry and flutter valve --Stable for transfer out of ICU today 11/18  Lactic acidosis -due to severe sepsis and hypoxia. Presented with lactic acid 2.0, increased to 2.2 but normalized with IV fluids.  Elevated BNP -prior echo reviewed and had normal EF and normal diastolic parameters.  Presented with BNP 246.8.  Chest x-ray without overt pulmonary edema.   On adm, no physical exam findings to suggest volume overload including no lower extremity edema or JVD.  Patient reported shortness of breath consistent with prior COPD exacerbations, had poor aeration and diffuse wheezing on exam. Echo 11/17: LV function not assessed due to poor acoustic window, mild MR. Very low suspicion for CHF. --I/O's and daily weights --Heart healthy diet  Elevated troponin due to demand ischemia -trend essentially flat and patient without active chest pain.  No acute ischemic changes on EKG. --Stat troponin and EKG if patient develops chest pain --Monitor on telemetry  Thrombocytopenia -suspect due to sepsis.  Platelets 145k on admission. --Monitor CBC  Hypertensive urgency /history of essential hypertension, uncontrolled -suspect BP severely elevated due to anxiety in the setting of respiratory distress. -- Continue home hydralazine, lisinopril and Lopressor --Monitor BP closely  Mild hyponatremia -presented with sodium 134. --IV fluids and monitor BMP  CAD -stable without active chest pain or evidence of ischemia. --Continue aspirin, Lopressor and Zocor  Mixed hyperlipidemia -continue Zocor  GERD -continue PPI  Patient BMI: Body mass index is 17.97 kg/m.   DVT prophylaxis: Place and maintain sequential  compression device Start: 04/27/21 1551 enoxaparin (LOVENOX) injection 40 mg Start: 04/26/21 2100 SCDs Start: 04/26/21 2001   Diet:  Diet Orders  (From admission, onward)     Start     Ordered   04/26/21 2001  Diet Heart Room service appropriate? Yes; Fluid consistency: Thin  Diet effective now       Question Answer Comment  Room service appropriate? Yes   Fluid consistency: Thin      04/26/21 2000              Code Status: Full Code   Subjective 04/29/21    Patient reports ongoing cough, but dry now.  No fevers or chills. Says he does better overnight with BiPAP.  Overall feels like he's breathing better.  Disposition Plan & Communication   Status is: Inpatient  Remains inpatient appropriate because: Remains on IV steroids, significant O2 need and on Bipap at night   Consults, Procedures, Significant Events   Consultants:  None  Procedures:   None  Antimicrobials:  Anti-infectives (From admission, onward)    Start     Dose/Rate Route Frequency Ordered Stop   04/28/21 1000  azithromycin (ZITHROMAX) tablet 250 mg       See Hyperspace for full Linked Orders Report.   250 mg Oral Daily 04/26/21 2117 04/24/2021 0959   04/27/21 1445  oseltamivir (TAMIFLU) capsule 75 mg        75 mg Oral 2 times daily 04/27/21 1349 04/27/2021 0959   04/27/21 1000  azithromycin (ZITHROMAX) tablet 500 mg       See Hyperspace for full Linked Orders Report.   500 mg Oral Daily 04/26/21 2117 04/27/21 0946   04/26/21 1745  oseltamivir (TAMIFLU) capsule 75 mg  Status:  Discontinued        75 mg Oral  Once 04/26/21 1737 04/27/21 1349   04/26/21 1600  ceFEPIme (MAXIPIME) 2 g in sodium chloride 0.9 % 100 mL IVPB        2 g 200 mL/hr over 30 Minutes Intravenous  Once 04/26/21 1557 04/26/21 1656   04/26/21 1600  azithromycin (ZITHROMAX) 500 mg in sodium chloride 0.9 % 250 mL IVPB        500 mg 250 mL/hr over 60 Minutes Intravenous  Once 04/26/21 1557 04/26/21 1800         Micro    Objective   Vitals:   04/29/21 1600 04/29/21 1700 04/29/21 1800 04/29/21 1900  BP:   (!) 161/71   Pulse: (!) 42 (!) 56 (!) 52 (!) 55  Resp:       Temp: 98.2 F (36.8 C)     TempSrc: Oral     SpO2: 98% 100% 100% 100%  Weight:        Intake/Output Summary (Last 24 hours) at 04/29/2021 2004 Last data filed at 04/29/2021 1700 Gross per 24 hour  Intake --  Output 800 ml  Net -800 ml   Filed Weights   04/27/21 0319 04/28/21 0220 04/29/21 0351  Weight: 51.9 kg 52.3 kg 53.6 kg    Physical Exam:  General exam: Sleeping, woke easily to voice, no acute distress Respiratory system: overall diminished but aeration improved, no wheezes, normal respiratory effort, on 4 L/min [place] O2 Cardiovascular system: normal S1/S2, RRR, no pedal edema.   GI:  non-tender non-distended abdomen Central nervous system: no gross focal neurologic deficits, normal speech Psychiatry: normal mood, congruent affect, judgement and insight appear normal  Labs   Data Reviewed: I have personally reviewed  following labs and imaging studies  CBC: Recent Labs  Lab 04/26/21 1555 04/27/21 0400 04/28/21 0610  WBC 5.8 7.3 7.9  HGB 13.6 13.5 12.1*  HCT 40.2 40.4 37.4*  MCV 89.9 89.2 91.0  PLT 145* 152 150   Basic Metabolic Panel: Recent Labs  Lab 04/26/21 1554 04/26/21 1555 04/27/21 0400 04/28/21 0610 04/29/21 0616  NA  --  134* 135 137 135  K  --  4.2 4.4 5.0 4.8  CL  --  97* 101 102 99  CO2  --  GLUCOSE  --  123* 146* 148* 133*  BUN  --  15 16 35* 34*  CREATININE  --  0.78 0.73 0.79 0.65  CALCIUM  --  8.9 8.2* 8.4* 8.3*  MG 1.9  --  2.2  --   --   PHOS  --   --  3.9  --   --    GFR: Estimated Creatinine Clearance: 62.3 mL/min (by C-G formula based on SCr of 0.65 mg/dL). Liver Function Tests: Recent Labs  Lab 04/26/21 1555 04/27/21 0400  AST 18 17  ALT 10 9  ALKPHOS 96 74  BILITOT 0.7 0.8  PROT 8.1 6.5  ALBUMIN 4.2 3.4*   No results for input(s): LIPASE, AMYLASE in the last 168 hours. No results for input(s): AMMONIA in the last 168 hours. Coagulation Profile: Recent Labs  Lab 04/26/21 1555  INR 1.0   Cardiac  Enzymes: No results for input(s): CKTOTAL, CKMB, CKMBINDEX, TROPONINI in the last 168 hours. BNP (last 3 results) No results for input(s): PROBNP in the last 8760 hours. HbA1C: No results for input(s): HGBA1C in the last 72 hours. CBG: Recent Labs  Lab 04/27/21 0301  GLUCAP 134*   Lipid Profile: No results for input(s): CHOL, HDL, LDLCALC, TRIG, CHOLHDL, LDLDIRECT in the last 72 hours. Thyroid Function Tests: No results for input(s): TSH, T4TOTAL, FREET4, T3FREE, THYROIDAB in the last 72 hours. Anemia Panel: No results for input(s): VITAMINB12, FOLATE, FERRITIN, TIBC, IRON, RETICCTPCT in the last 72 hours. Sepsis Labs: Recent Labs  Lab 04/26/21 1555 04/26/21 1800 04/26/21 2035 04/27/21 0000  LATICACIDVEN 2.0* 2.2* 2.2* 1.8    Recent Results (from the past 240 hour(s))  Resp Panel by RT-PCR (Flu A&B, Covid) Nasopharyngeal Swab     Status: Abnormal   Collection Time: 04/26/21  3:51 PM   Specimen: Nasopharyngeal Swab; Nasopharyngeal(NP) swabs in vial transport medium  Result Value Ref Range Status   SARS Coronavirus 2 by RT PCR NEGATIVE NEGATIVE Final    Comment: (NOTE) SARS-CoV-2 target nucleic acids are NOT DETECTED.  The SARS-CoV-2 RNA is generally detectable in upper respiratory specimens during the acute phase of infection. The lowest concentration of SARS-CoV-2 viral copies this assay can detect is 138 copies/mL. A negative result does not preclude SARS-Cov-2 infection and should not be used as the sole basis for treatment or other patient management decisions. A negative result may occur with  improper specimen collection/handling, submission of specimen other than nasopharyngeal swab, presence of viral mutation(s) within the areas targeted by this assay, and inadequate number of viral copies(<138 copies/mL). A negative result must be combined with clinical observations, patient history, and epidemiological information. The expected result is Negative.  Fact  Sheet for Patients:  BloggerCourse.com  Fact Sheet for Healthcare Providers:  SeriousBroker.it  This test is no t yet approved or cleared by the [place] FDA and  has been authorized for detection and/or diagnosis of SARS-CoV-2 by FDA  under an Emergency Use Authorization (EUA). This EUA will remain  in effect (meaning this test can be used) for the duration of the COVID-19 declaration under Section 564(b)(1) of the Act, 21 U.S.C.section 360bbb-3(b)(1), unless the authorization is terminated  or revoked sooner.       Influenza A by PCR POSITIVE (A) NEGATIVE Final   Influenza B by PCR NEGATIVE NEGATIVE Final    Comment: (NOTE) The Xpert Xpress SARS-CoV-2/FLU/RSV plus assay is intended as an aid in the diagnosis of influenza from Nasopharyngeal swab specimens and should not be used as a sole basis for treatment. Nasal washings and aspirates are unacceptable for Xpert Xpress SARS-CoV-2/FLU/RSV testing.  Fact Sheet for Patients: BloggerCourse.com  Fact Sheet for Healthcare Providers: SeriousBroker.it  This test is not yet approved or cleared by the [place] FDA and has been authorized for detection and/or diagnosis of SARS-CoV-2 by FDA under an Emergency Use Authorization (EUA). This EUA will remain in effect (meaning this test can be used) for the duration of the COVID-19 declaration under Section 564(b)(1) of the Act, 21 U.S.C. section 360bbb-3(b)(1), unless the authorization is terminated or revoked.  Performed at [facility], [address] [place], [place] [address]   Blood Culture (routine x 2)     Status: None (Preliminary result)   Collection Time: 04/26/21  4:10 PM   Specimen: BLOOD  Result Value Ref Range Status   Specimen Description BLOOD RIGHT ANTECUBITAL  Final   Special Requests   Final    BOTTLES DRAWN AEROBIC AND ANAEROBIC Blood Culture  adequate volume   Culture   Final    NO GROWTH 3 DAYS Performed at [facility], [address] [place], [place] [address]    Report Status PENDING  Incomplete  Blood Culture (routine x 2)     Status: None (Preliminary result)   Collection Time: 04/26/21  4:15 PM   Specimen: BLOOD  Result Value Ref Range Status   Specimen Description BLOOD LEFT ANTECUBITAL  Final   Special Requests   Final    BOTTLES DRAWN AEROBIC AND ANAEROBIC Blood Culture adequate volume   Culture   Final    NO GROWTH 3 DAYS Performed at [facility], [address] [place], [place] [address]    Report Status PENDING  Incomplete      Imaging Studies   ECHOCARDIOGRAM COMPLETE  Result Date: 04/28/2021    ECHOCARDIOGRAM REPORT   Patient Name:   MARSHAUN LORTIE Date of Exam: 04/28/2021 Medical Rec #:  [id number]         Height:       68.0 in Accession #:    [id number]        Weight:       115.3 lb Date of Birth:  1945/11/25        BSA:          1.617 m Patient Age:    74 years          BP:           140/80 mmHg Patient Gender: M                 HR:           53 bpm. Exam Location:  [facility] Procedure: 2D Echo, Limited Color Doppler and Cardiac Doppler Indications:     I50.31 congestive heart failure-Acute Diastolic  History:         Patient has no prior history of Echocardiogram examinations.  CHF, COPD; Risk Factors:Current Smoker.  Sonographer:     Humphrey Rolls Referring Phys:  [id number] OLADAPO ADEFESO Diagnosing Phys: Sena Slate  Sonographer Comments: Technically challenging study due to limited acoustic windows. Image acquisition challenging due to COPD. IMPRESSIONS  1. Left ventricular function is difficult to assess on current study given poor acoustic window.  2. Right ventricular function is probably normal, but unable to accurately assess RV size.  3. The mitral valve is grossly normal. Mild mitral valve regurgitation. No evidence of mitral stenosis.  4. Aortic valve is calcified but  appears to open normally. Unable to obtain velocities to assess for stenosis or doppler for regurgitation.  5. The inferior vena cava is normal in size with greater than 50% respiratory variability, suggesting right atrial pressure of 3 mmHg. FINDINGS  Left Ventricle: Left ventricular function is difficult to assess on current study given poor acoustic window. Right Ventricle: Right ventricular function is probably normal, but unable to accurately assess RV size. Left Atrium: Left atrial size was not well visualized. Right Atrium: Right atrial size was not well visualized. Pericardium: There is no evidence of pericardial effusion. Mitral Valve: The mitral valve is grossly normal. Mild mitral valve regurgitation. No evidence of mitral valve stenosis. MV peak gradient, 3.0 mmHg. The mean mitral valve gradient is 1.0 mmHg. Tricuspid Valve: The tricuspid valve is normal in structure. Tricuspid valve regurgitation is not demonstrated. No evidence of tricuspid stenosis. Pulmonic Valve: The pulmonic valve was not well visualized. Pulmonic valve regurgitation is not visualized. No evidence of pulmonic stenosis. Aorta: Aortic valve is calcified but appears to open normally. Unable to obtain velocities to assess for stenosis or doppler for regurgitation. The aortic root was not well visualized. Venous: The inferior vena cava is normal in size with greater than 50% respiratory variability, suggesting right atrial pressure of 3 mmHg. IAS/Shunts: The interatrial septum was not assessed.   LV Volumes (MOD) LV vol d, MOD A4C: 47.0 ml LV vol s, MOD A4C: 28.7 ml LV SV MOD A4C:     47.0 ml PULMONIC VALVE PV Vmax:       0.83 m/s PV Vmean:      53.900 cm/s PV VTI:        0.136 m PV Peak grad:  2.8 mmHg PV Mean grad:  1.0 mmHg  MITRAL VALVE MV Area (PHT): 3.63 cm MV Peak grad:  3.0 mmHg MV Mean grad:  1.0 mmHg MV Vmax:       0.86 m/s MV Vmean:      52.2 cm/s MV Decel Time: 209 msec MV E velocity: 60.00 cm/s MV A velocity: 65.55 cm/s MV  E/A ratio:  0.92 Sena Slate Electronically signed by Sena Slate Signature Date/Time: 04/28/2021/12:41:12 PM    Final      Medications   Scheduled Meds:  aspirin EC  81 mg Oral Daily   azithromycin  250 mg Oral Daily   Chlorhexidine Gluconate Cloth  6 each Topical Daily   dextromethorphan-guaiFENesin  1 tablet Oral BID   enoxaparin (LOVENOX) injection  40 mg Subcutaneous Q24H   feeding supplement  237 mL Oral TID BM   hydrALAZINE  50 mg Oral Q12H   levalbuterol  0.63 mg Nebulization Q6H   lisinopril  40 mg Oral Daily   metoprolol tartrate  50 mg Oral BID   mometasone-formoterol  2 puff Inhalation BID   [START ON 04/30/2021] multivitamin with minerals  1 tablet Oral Daily   oseltamivir  75 mg Oral BID  pantoprazole  40 mg Oral QHS   [START ON 04/30/2021] predniSONE  40 mg Oral Q breakfast   Continuous Infusions:     LOS: 3 days    Time spent: 25 minutes with > 50% spent at bedside and in coordination of care     Pennie Banter, DO [facility]  04/29/2021, 8:04 PM      If 7PM-7AM, please contact night-coverage. How to contact the [facility] Attending or Consulting provider 7A - 7P or covering provider during after hours 7P -7A, for this patient?    Check the care team in [facility] and look for a) attending/consulting TRH provider listed and b) the [facility] team listed Log into [email or web address] and use [place]'s universal password to access. If you do not have the password, please contact the hospital operator. Locate the [facility] provider you are looking for under [facility] and page to a number that you can be directly reached. If you still have difficulty reaching the provider, please page the [facility] (Director on Call) for the Hospitalists listed on amion for assistance.

## 2021-04-29 NOTE — Progress Notes (Signed)
Physical Therapy Treatment Patient Details Name: Ian Herman MRN: [id number] DOB: Apr 27, 1946 Today's Date: 04/29/2021   History of Present Illness MARIK SEDORE is a 75 y.o. male with medical history significant for COPD not on home oxygen, CHF, essential hypertension, GERD, mixed hyperlipidemia and tobacco use who presents to the emergency department due to 2-day onset of increasing work of breathing with worsening shortness of breath.    PT Comments    Pt was long sitting in bed watching TV upon arriving. He agrees to session and is cooperative however has flat affect. He was easily able to exit bed, stand, and ambulate without LOB or safety concern. On 3 L o2 throughout gait training without desaturation. Minimal SOB however quickly recovers when he returned to bed. Recommend DC home with HHPT to address deficits.    Recommendations for follow up therapy are one component of a multi-disciplinary discharge planning process, led by the attending physician.  Recommendations may be updated based on patient status, additional functional criteria and insurance authorization.  Follow Up Recommendations  Home health PT     Assistance Recommended at Discharge PRN  Equipment Recommendations  None recommended by PT       Precautions / Restrictions Precautions Precautions: Fall Restrictions Weight Bearing Restrictions: No     Mobility  Bed Mobility Overal bed mobility: Modified Independent       Transfers Overall transfer level: Needs assistance Equipment used: Rolling walker (2 wheels) Transfers: Sit to/from Stand Sit to Stand: Supervision      General transfer comment: pt states he has RW at home. Used RW due to easier to management of lines. Will attempt less restrictive AD next session.    Ambulation/Gait Ambulation/Gait assistance: Supervision Gait Distance (Feet): 175 Feet Assistive device: Rolling walker (2 wheels) Gait Pattern/deviations: Step-through  pattern Gait velocity: decreased     General Gait Details: Pt was able to ambulate ~ 6 laps around room with supervision. author assisted with management of line only    Balance Overall balance assessment: Needs assistance Sitting-balance support: Feet supported Sitting balance-Leahy Scale: Good     Standing balance support: Bilateral upper extremity supported;During functional activity Standing balance-Leahy Scale: Good      Cognition Arousal/Alertness: Awake/alert Behavior During Therapy: WFL for tasks assessed/performed;Flat affect Overall Cognitive Status: Within Functional Limits for tasks assessed      General Comments: Pt is A and O x 4               Pertinent Vitals/Pain Pain Assessment: No/denies pain     PT Goals (current goals can now be found in the care plan section) Acute Rehab PT Goals Patient Stated Goal: to return home Progress towards PT goals: Progressing toward goals    Frequency    Min 2X/week      PT Plan Current plan remains appropriate       AM-PAC PT "6 Clicks" Mobility   Outcome Measure  Help needed turning from your back to your side while in a flat bed without using bedrails?: None Help needed moving from lying on your back to sitting on the side of a flat bed without using bedrails?: A Little Help needed moving to and from a bed to a chair (including a wheelchair)?: A Little Help needed standing up from a chair using your arms (e.g., wheelchair or bedside chair)?: A Little Help needed to walk in hospital room?: A Little Help needed climbing 3-5 steps with a railing? : A Little 6 Click Score:  19    End of Session Equipment Utilized During Treatment: Oxygen Activity Tolerance: Patient limited by fatigue Patient left: in bed;with call bell/phone within reach Nurse Communication: Mobility status PT Visit Diagnosis: Unsteadiness on feet (R26.81);Muscle weakness (generalized) (M62.81);Difficulty in walking, not elsewhere  classified (R26.2)     Time: 3016-0109 PT Time Calculation (min) (ACUTE ONLY): 18 min  Charges:  $Gait Training: 8-22 mins                     Jetta Lout PTA 04/29/21, 2:01 PM

## 2021-04-30 DIAGNOSIS — J101 Influenza due to other identified influenza virus with other respiratory manifestations: Secondary | ICD-10-CM

## 2021-04-30 MED ORDER — PREDNISONE 10 MG PO TABS: ORAL_TABLET | ORAL | 0 refills | Status: AC

## 2021-04-30 MED ORDER — LORAZEPAM 1 MG PO TABS
0.5000 mg | ORAL_TABLET | Freq: Three times a day (TID) | ORAL | Status: DC | PRN
  Administered 2021-04-30: 0.5 mg via ORAL
  Filled 2021-04-30: qty 1

## 2021-04-30 MED ORDER — DM-GUAIFENESIN ER 30-600 MG PO TB12: 1.0000 | ORAL_TABLET | Freq: Two times a day (BID) | ORAL | 0 refills | Status: AC

## 2021-04-30 MED ORDER — ADULT MULTIVITAMIN W/MINERALS CH: 1.0000 | ORAL_TABLET | Freq: Every day | ORAL | Status: AC

## 2021-04-30 MED ORDER — OSELTAMIVIR PHOSPHATE 75 MG PO CAPS: 75.0000 mg | ORAL_CAPSULE | Freq: Two times a day (BID) | ORAL | 0 refills | Status: AC

## 2021-04-30 NOTE — Progress Notes (Addendum)
MD okay with patient going home as long as they have oxygen but would rather patient stay until Monday.  Patient insisted on leaving, family picked patient up with oxygen tubing.  Case management will arrange oxygen for delivery to patient on Monday.

## 2021-04-30 NOTE — TOC Progression Note (Addendum)
Transition of Care [facility]) - Progression Note    Patient Details  Name: Ian Herman MRN: [id number] Date of Birth: 1946-05-03  Transition of [facility] CM/SW Contact  Aariv Medlock, [place], [place] Phone Number: 04/30/2021, 4:03 PM  Clinical Narrative:    Phone call from [employer] stating that patient's medicare is inactive and they are not contracted with the [place]. They will not be able to supply patient with the oxygen. Phone call made to [facility]. They will not be able to supply patient with the oxygen today as the order will have to come through the [place] home 02 clinic. They will not be open until Monday. Patient and treatment team to be notified.  4:43 pm [facility] contacted, patient's information provided to [employer] for oxygen set up for Monday.  Reginold Beale [address] LCSW Transition of Care [phone]    Expected Discharge Plan: Home/Self Care Barriers to Discharge: No Barriers Identified  Expected Discharge Plan and Services Expected Discharge Plan: Home/Self Care In-house Referral: Clinical Social Work   Post Acute Care Choice: Durable Medical Equipment Living arrangements for the past 2 months: Single Family Home Expected Discharge Date: 04/30/21               DME Arranged: Oxygen DME Agency: [facility] Date DME Agency Contacted: 04/30/21 Time DME Agency Contacted: 1136 Representative spoke with at DME Agency: Leavy Cella             Social Determinants of Health (SDOH) Interventions    Readmission Risk Interventions No flowsheet data found.

## 2021-04-30 NOTE — Progress Notes (Signed)
Spoke to [employer] she will pick patient up this afternoon once oxygen arrives.

## 2021-04-30 NOTE — Progress Notes (Addendum)
SATURATION QUALIFICATIONS: (This note is used to comply with regulatory documentation for home oxygen)  Patient Saturations on Room Air at Rest = 86%  Patient Saturations on Room Air while Ambulating = 82%  Patient Saturations on 2 Liters of oxygen while Ambulating = 94%  Please briefly explain why patient needs home oxygen:  Shortness of breath on exertion

## 2021-04-30 NOTE — Discharge Summary (Signed)
Physician Discharge Summary  Ian Herman:[id number] DOB: [birth year] DOA: 04/26/2021  PCP: Louanne Skye, MD  Admit date: 04/26/2021 Discharge date: 04/30/2021  Admitted From: home Disposition:  home  Recommendations for Outpatient Follow-up:  Follow up with PCP in 1-2 weeks Please obtain BMP/CBC in one week Please follow up on patient's need for home oxygen.  He was requiring 2 L/min at time of discharge.  Home Health: PT  Equipment/Devices: Oxygen 2 L/min   Discharge Condition: STable  CODE STATUS: Full  Diet recommendation: Heart Healthy    Discharge Diagnoses: Principal Problem:   Influenza A Active Problems:   COPD exacerbation (HCC)   Acute on chronic respiratory failure with hypoxia (HCC)   Coronary artery disease   Essential hypertension   Mixed hyperlipidemia   Severe sepsis (HCC)   Tobacco abuse   Elevated brain natriuretic peptide (BNP) level   Elevated troponin   Lactic acidosis   Hyponatremia   GERD (gastroesophageal reflux disease)   Hypertensive urgency    Summary of HPI and Hospital Course:  75 y.o. male with medical history significant for COPD (on home oxygen but not prescribed to him), essential hypertension, GERD, hyperlipidemia and tobacco abuse who presented to the ED on 04/26/21 with complaints of 2-days progressively worsening shortness of breath and significantly increased work of breathing, not improved with his home inhalers.     In the ED, pt in acute respiratory distress, placed on BiPAP, febrile and tachycardic.  Tested positive for Influenza A. Admitted to stepdown on TRH service, on BiPAP.   Severe sepsis secondary to influenza A Acute on chronic respiratory failure with hypoxia due to Acute exacerbation of COPD (likely triggered by fluid) Patient presented with fever, tachypnea and tachycardia in the setting of influenza A infection.  Lactic acidosis reflecting organ dysfunction and severe sepsis. Sepsis physiology  improving and lactic acidosis resolved. Pt reports using O2 at home but without prescription, has a friend that gets tanks refilled. Baseline O2 requirement unknown. Treated with Tamiflu, IV steroids and Zithromax, in addition to supportive care with bronchodilators, mucolytics. Steroids transitioned to Prednisone and d/c with taper and remaining course of Tamiflu. Pt qualified for home oxygen, TOC arranged this. Supplemental O2 to maintain sats 88-93%  Pulmonary hygiene: Incentive spirometry and flutter valve   Lactic acidosis -due to severe sepsis and hypoxia. Presented with lactic acid 2.0, increased to 2.2 but normalized with IV fluids.  Elevated BNP -prior echo reviewed and had normal EF and normal diastolic parameters.  Presented with BNP 246.8.  Chest x-ray without overt pulmonary edema.   On adm, no physical exam findings to suggest volume overload including no lower extremity edema or JVD.  Patient reported shortness of breath consistent with prior COPD exacerbations, had poor aeration and diffuse wheezing on exam. Echo 11/17: LV function not assessed due to poor acoustic window, mild MR. Very low suspicion for CHF decompensation. --Daily weights at home recommended --Heart healthy diet   Elevated troponin due to demand ischemia in setting of hypoxia and sepsis -trend essentially flat and patient without active chest pain.  No acute ischemic changes on EKG.   Thrombocytopenia -suspect due to sepsis.  Platelets 145k on admission.   Improved.   Hypertensive urgency /history of essential hypertension, uncontrolled -suspect BP severely elevated due to anxiety in the setting of respiratory distress. Continue home hydralazine, lisinopril and Lopressor Outpt follow up.   Mild hyponatremia -presented with sodium 134, likely hypovolemic in setting of sepsis. Treated with IV fluids and  improved.  CAD -stable without active chest pain or evidence of ischemia.  Continue aspirin, Lopressor  and Zocor.  Mixed hyperlipidemia -continue Zocor  GERD -continue PPI    Discharge Instructions   Discharge Instructions     Call MD for:   Complete by: As directed    Worsening shortness or breath or wheezing. Needing to use your inhalers / nebs more frequently. Needing to turn up the oxygen to keep your level above 88%   Call MD for:  extreme fatigue   Complete by: As directed    Call MD for:  persistant dizziness or light-headedness   Complete by: As directed    Call MD for:  severe uncontrolled pain   Complete by: As directed    Call MD for:  temperature >100.4   Complete by: As directed    Diet - low sodium heart healthy   Complete by: As directed    Discharge instructions   Complete by: As directed    For the Flu --- please take Tamiflu for 4 more doses -- ONE this evening, ONE tomorrow morning and evening, LAST ONE on Monday morning.  For your COPD and shortness of breath --- continue using your inhalers and nebulizers as prescribed.   I sent a Prednisone taper to your pharmacy -- over the next SIX days.  Start with 30 mg tomorrow. The dose is reduced by 10 mg every 2 days. Take 30 mg tomorrow and Monday. Take 20 mg on Tues and Wednesday. Take 10 mg on Thurs and Friday, then you're done.  [facility].  We got you set up for HOME OXYGEN.   You need 2 L/min of OXYGEN right now.   Your goal for "oxygen saturation" (or oxygen level) is 88-93%. If you are needing more than 2 L/min to keep your level above 88%, please call [facility] doctors or come to the ER.  Please let the [facility] know that you have this now. They should follow up with you about how much oxygen you're needing.   --Dr. Esaw Grandchild, DO   [facility]   [facility]   Increase activity slowly   Complete by: As directed       Allergies as of 04/30/2021       Reactions   Shellfish Allergy    Trazodone    Other reaction(s): OTHER  REACTION, dystonia, OTHER REACTION, dystonia   Chlorpromazine Hives, Anxiety, Rash   Hives Other reaction(s): Anxiety, AGITATION, Cramp, MUSCLE SPASMS, OTHER REACTION, PARKINSONIAN-LIKE SYNDROME, UNKNOWN   Codeine Itching, Rash   itching Other reaction(s): HIVES, SPASISMS, UNKNOWN        Medication List     STOP taking these medications    guaiFENesin 600 MG 12 hr tablet Commonly known as: MUCINEX   oxyCODONE-acetaminophen 5-325 MG tablet Commonly known as: Percocet   predniSONE 10 MG (21) Tbpk tablet Commonly known as: STERAPRED UNI-PAK 21 TAB Replaced by: predniSONE 10 MG tablet       TAKE these medications    acetaminophen 325 MG tablet Commonly known as: TYLENOL Take 2 tablets (650 mg total) by mouth every 6 (six) hours as needed for mild pain or fever.   albuterol 0.63 MG/3ML nebulizer solution Commonly known as: ACCUNEB Take 1 ampule by nebulization every 6 (six) hours as needed for wheezing.   aspirin EC 81 MG tablet Take 81 mg by mouth daily.   carboxymethylcellulose 0.5 % Soln  Commonly known as: REFRESH PLUS Apply 1 drop to eye 3 (three) times daily as needed (dry eyes).   dextromethorphan-guaiFENesin 30-600 MG 12hr tablet Commonly known as: MUCINEX DM Take 1 tablet by mouth 2 (two) times daily for 7 days.   ENSURE PLUS PO Take 1 Can by mouth See admin instructions. Consume contents of 1 can every day separate from meals. Do not use as meal replacement.   fluticasone-salmeterol 250-50 MCG/ACT Aepb Commonly known as: ADVAIR Inhale 1 puff into the lungs in the morning and at bedtime.   hydrALAZINE 50 MG tablet Commonly known as: APRESOLINE Take 1 tablet by mouth in the morning and at bedtime.   Ipratropium-Albuterol 20-100 MCG/ACT Aers respimat Commonly known as: COMBIVENT Inhale 1 puff into the lungs every 6 (six) hours as needed for wheezing or shortness of breath.   ipratropium-albuterol 0.5-2.5 (3) MG/3ML Soln Commonly known as:  DUONEB Take 3 mLs by nebulization every 6 (six) hours for 3 days.   lisinopril 40 MG tablet Commonly known as: ZESTRIL Take 1 tablet by mouth daily.   meloxicam 15 MG tablet Commonly known as: MOBIC Take 1 tablet by mouth daily.   metoprolol tartrate 50 MG tablet Commonly known as: LOPRESSOR Take 1 tablet (50 mg total) by mouth 2 (two) times daily.   multivitamin with minerals Tabs tablet Take 1 tablet by mouth daily. Start taking on: May 01, 2021   omeprazole 20 MG capsule Commonly known as: PRILOSEC Take 1 capsule by mouth daily.   oseltamivir 75 MG capsule Commonly known as: TAMIFLU Take 1 capsule (75 mg total) by mouth 2 (two) times daily for 4 doses.   predniSONE 10 MG tablet Commonly known as: DELTASONE Take 3 tablets (30 mg total) by mouth daily with breakfast for 2 days, THEN 2 tablets (20 mg total) daily with breakfast for 2 days, THEN 1 tablet (10 mg total) daily with breakfast for 2 days. Start taking on: April 30, 2021 Replaces: predniSONE 10 MG (21) Tbpk tablet   sildenafil 100 MG tablet Commonly known as: VIAGRA Take 100 mg by mouth as needed for erectile dysfunction.   simvastatin 20 MG tablet Commonly known as: ZOCOR Take 20 mg by mouth every evening.   tiZANidine 4 MG tablet Commonly known as: ZANAFLEX Take 2 tablets by mouth at bedtime.        Allergies  Allergen Reactions   Shellfish Allergy    Trazodone     Other reaction(s): OTHER REACTION, dystonia, OTHER REACTION, dystonia   Chlorpromazine Hives, Anxiety and Rash    Hives  Other reaction(s): Anxiety, AGITATION, Cramp, MUSCLE SPASMS, OTHER REACTION, PARKINSONIAN-LIKE SYNDROME, UNKNOWN   Codeine Itching and Rash    itching Other reaction(s): HIVES, SPASISMS, UNKNOWN     If you experience worsening of your admission symptoms, develop shortness of breath, life threatening emergency, suicidal or homicidal thoughts you must seek medical attention immediately by calling 911 or  calling your MD immediately  if symptoms less severe.    Please note   You were cared for by a hospitalist during your hospital stay. If you have any questions about your discharge medications or the care you received while you were in the hospital after you are discharged, you can call the unit and asked to speak with the hospitalist on call if the hospitalist that took care of you is not available. Once you are discharged, your primary care physician will handle any further medical issues. Please note that NO REFILLS for any discharge medications  will be authorized once you are discharged, as it is imperative that you return to your primary care physician (or establish a relationship with a primary care physician if you do not have one) for your aftercare needs so that they can reassess your need for medications and monitor your lab values.   Consultations: None    Procedures/Studies: DG Chest Portable 1 View  Result Date: 04/26/2021 CLINICAL DATA:  Respiratory distress EXAM: PORTABLE CHEST 1 VIEW COMPARISON:  Radiograph 03/22/2021 FINDINGS: Unchanged cardiomediastinal silhouette. Emphysema with bilateral lung scarring. Unchanged pulmonary hyperinflation. No new airspace disease. Chronic bilateral costophrenic angle blunting. No visible pneumothorax. No acute osseous abnormality. IMPRESSION: No definite acute process.  Emphysema with bilateral lung scarring. Electronically Signed   By: Caprice Renshaw M.D.   On: 04/26/2021 16:40   DG Hand Complete Right  Result Date: 03/31/2021 CLINICAL DATA:  Hand injury EXAM: RIGHT HAND - COMPLETE 3+ VIEW COMPARISON:  None. FINDINGS: There is irregularity of the scaphoid waist. There is moderate base of thumb degenerative arthritis. There is chondrocalcinosis of the radiocarpal joint and TFCC. Arthritis related cystic change in the carpal bones. IMPRESSION: Irregularity at the scaphoid waist, suggesting possible nondisplaced scaphoid fracture. Correlate with  anatomic snuffbox tenderness and consider splinting with follow-up radiographs in 10-14 days. 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   On: 03/31/2021 13:44   ECHOCARDIOGRAM COMPLETE  Result Date: 04/28/2021    ECHOCARDIOGRAM REPORT   Patient Name:   Ian Herman Date of Exam: 04/28/2021 Medical Rec #:  [id number]         Height:       68.0 in Accession #:    [id number]        Weight:       115.3 lb Date of Birth:  07/15/1945        BSA:          1.617 m Patient Age:    74 years          BP:           140/80 mmHg Patient Gender: M                 HR:           53 bpm. Exam Location:  [facility] Procedure: 2D Echo, Limited Color Doppler and Cardiac Doppler Indications:     I50.31 congestive heart failure-Acute Diastolic  History:         Patient has no prior history of Echocardiogram examinations.                  CHF, COPD; Risk Factors:Current Smoker.  Sonographer:     Humphrey Rolls Referring Phys:  [id number] OLADAPO ADEFESO Diagnosing Phys: Sena Slate  Sonographer Comments: Technically challenging study due to limited acoustic windows. Image acquisition challenging due to COPD. IMPRESSIONS  1. Left ventricular function is difficult to assess on current study given poor acoustic window.  2. Right ventricular function is probably normal, but unable to accurately assess RV size.  3. The mitral valve is grossly normal. Mild mitral valve regurgitation. No evidence of mitral stenosis.  4. Aortic valve is calcified but appears to open normally. Unable to obtain velocities to assess for stenosis or doppler for regurgitation.  5. The inferior vena cava is normal in size with greater than 50% respiratory variability, suggesting right atrial pressure  of 3 mmHg. FINDINGS  Left Ventricle: Left ventricular function is difficult to assess on current study given poor acoustic  window. Right Ventricle: Right ventricular function is probably normal, but unable to accurately assess RV size. Left Atrium: Left atrial size was not well visualized. Right Atrium: Right atrial size was not well visualized. Pericardium: There is no evidence of pericardial effusion. Mitral Valve: The mitral valve is grossly normal. Mild mitral valve regurgitation. No evidence of mitral valve stenosis. MV peak gradient, 3.0 mmHg. The mean mitral valve gradient is 1.0 mmHg. Tricuspid Valve: The tricuspid valve is normal in structure. Tricuspid valve regurgitation is not demonstrated. No evidence of tricuspid stenosis. Pulmonic Valve: The pulmonic valve was not well visualized. Pulmonic valve regurgitation is not visualized. No evidence of pulmonic stenosis. Aorta: Aortic valve is calcified but appears to open normally. Unable to obtain velocities to assess for stenosis or doppler for regurgitation. The aortic root was not well visualized. Venous: The inferior vena cava is normal in size with greater than 50% respiratory variability, suggesting right atrial pressure of 3 mmHg. IAS/Shunts: The interatrial septum was not assessed.   LV Volumes (MOD) LV vol d, MOD A4C: 47.0 ml LV vol s, MOD A4C: 28.7 ml LV SV MOD A4C:     47.0 ml PULMONIC VALVE PV Vmax:       0.83 m/s PV Vmean:      53.900 cm/s PV VTI:        0.136 m PV Peak grad:  2.8 mmHg PV Mean grad:  1.0 mmHg  MITRAL VALVE MV Area (PHT): 3.63 cm MV Peak grad:  3.0 mmHg MV Mean grad:  1.0 mmHg MV Vmax:       0.86 m/s MV Vmean:      52.2 cm/s MV Decel Time: 209 msec MV E velocity: 60.00 cm/s MV A velocity: 65.55 cm/s MV E/A ratio:  0.92 Sena Slate Electronically signed by Sena Slate Signature Date/Time: 04/28/2021/12:41:12 PM    Final         Subjective: Pt reports feeling better. No fever/chills.  Cough is a little improved.  He feels like he will do fine at home and eager to get out of the hospital.  Agreeable to home health PT.   Discharge Exam: Vitals:    04/30/21 0500 04/30/21 0800  BP: 123/66 (!) 155/61  Pulse: (!) 48 (!) 49  Resp: 15 20  Temp:  (!) 97.2 F (36.2 C)  SpO2: 98% 100%   Vitals:   04/30/21 0400 04/30/21 0435 04/30/21 0500 04/30/21 0800  BP: (!) 148/75  123/66 (!) 155/61  Pulse: (!) 49  (!) 48 (!) 49  Resp: Temp:    (!) 97.2 F (36.2 C)  TempSrc:    Oral  SpO2: 100%  98% 100%  Weight:  49.6 kg      General: Pt is alert, awake, not in acute distress, frail, chronically ill-appearing Cardiovascular: RRR, S1/S2 +, no rubs, no gallops Respiratory: CTA bilaterally with mildly improved air movement, no wheezing, no rhonchi Abdominal: Soft, NT, ND, bowel sounds + Extremities: no edema, no cyanosis    The results of significant diagnostics from this hospitalization (including imaging, microbiology, ancillary and laboratory) are listed below for reference.     Microbiology: Recent Results (from the past 240 hour(s))  Resp Panel by RT-PCR (Flu A&B, Covid) Nasopharyngeal Swab     Status: Abnormal   Collection Time: 04/26/21  3:51 PM   Specimen: Nasopharyngeal Swab; Nasopharyngeal(NP) swabs  in vial transport medium  Result Value Ref Range Status   SARS Coronavirus 2 by RT PCR NEGATIVE NEGATIVE Final    Comment: (NOTE) SARS-CoV-2 target nucleic acids are NOT DETECTED.  The SARS-CoV-2 RNA is generally detectable in upper respiratory specimens during the acute phase of infection. The lowest concentration of SARS-CoV-2 viral copies this assay can detect is 138 copies/mL. A negative result does not preclude SARS-Cov-2 infection and should not be used as the sole basis for treatment or other patient management decisions. A negative result may occur with  improper specimen collection/handling, submission of specimen other than nasopharyngeal swab, presence of viral mutation(s) within the areas targeted by this assay, and inadequate number of viral copies(<138 copies/mL). A negative result must be combined  with clinical observations, patient history, and epidemiological information. The expected result is Negative.  Fact Sheet for Patients:  BloggerCourse.com  Fact Sheet for Healthcare Providers:  SeriousBroker.it  This test is no t yet approved or cleared by the [place] FDA and  has been authorized for detection and/or diagnosis of SARS-CoV-2 by FDA under an Emergency Use Authorization (EUA). This EUA will remain  in effect (meaning this test can be used) for the duration of the COVID-19 declaration under Section 564(b)(1) of the Act, 21 U.S.C.section 360bbb-3(b)(1), unless the authorization is terminated  or revoked sooner.       Influenza A by PCR POSITIVE (A) NEGATIVE Final   Influenza B by PCR NEGATIVE NEGATIVE Final    Comment: (NOTE) The Xpert Xpress SARS-CoV-2/FLU/RSV plus assay is intended as an aid in the diagnosis of influenza from Nasopharyngeal swab specimens and should not be used as a sole basis for treatment. Nasal washings and aspirates are unacceptable for Xpert Xpress SARS-CoV-2/FLU/RSV testing.  Fact Sheet for Patients: BloggerCourse.com  Fact Sheet for Healthcare Providers: SeriousBroker.it  This test is not yet approved or cleared by the [place] FDA and has been authorized for detection and/or diagnosis of SARS-CoV-2 by FDA under an Emergency Use Authorization (EUA). This EUA will remain in effect (meaning this test can be used) for the duration of the COVID-19 declaration under Section 564(b)(1) of the Act, 21 U.S.C. section 360bbb-3(b)(1), unless the authorization is terminated or revoked.  Performed at [facility], [address] [place], [place] [address]   Blood Culture (routine x 2)     Status: None (Preliminary result)   Collection Time: 04/26/21  4:10 PM   Specimen: BLOOD  Result Value Ref Range Status   Specimen  Description BLOOD RIGHT ANTECUBITAL  Final   Special Requests   Final    BOTTLES DRAWN AEROBIC AND ANAEROBIC Blood Culture adequate volume   Culture   Final    NO GROWTH 4 DAYS Performed at [facility], [address], [place], [place] [address]    Report Status PENDING  Incomplete  Blood Culture (routine x 2)     Status: None (Preliminary result)   Collection Time: 04/26/21  4:15 PM   Specimen: BLOOD  Result Value Ref Range Status   Specimen Description BLOOD LEFT ANTECUBITAL  Final   Special Requests   Final    BOTTLES DRAWN AEROBIC AND ANAEROBIC Blood Culture adequate volume   Culture   Final    NO GROWTH 4 DAYS Performed at [facility], [address] [place], [place] [address]    Report Status PENDING  Incomplete     Labs: BNP (last 3 results) Recent Labs    12/25/20 1125 03/22/21 0835  04/26/21 1554  BNP 90.3 36.0 246.8*   Basic Metabolic Panel: Recent Labs  Lab 04/26/21 1554 04/26/21 1555 04/27/21 0400 04/28/21 0610 04/29/21 0616  NA  --  134* 135 137 135  K  --  4.2 4.4 5.0 4.8  CL  --  97* 101 102 99  CO2  --  28 26 29 29   GLUCOSE  --  123* 146* 148* 133*  BUN  --  15 16 35* 34*  CREATININE  --  0.78 0.73 0.79 0.65  CALCIUM  --  8.9 8.2* 8.4* 8.3*  MG 1.9  --  2.2  --   --   PHOS  --   --  3.9  --   --    Liver Function Tests: Recent Labs  Lab 04/26/21 1555 04/27/21 0400  AST 18 17  ALT 10 9  ALKPHOS 96 74  BILITOT 0.7 0.8  PROT 8.1 6.5  ALBUMIN 4.2 3.4*   No results for input(s): LIPASE, AMYLASE in the last 168 hours. No results for input(s): AMMONIA in the last 168 hours. CBC: Recent Labs  Lab 04/26/21 1555 04/27/21 0400 04/28/21 0610  WBC 5.8 7.3 7.9  HGB 13.6 13.5 12.1*  HCT 40.2 40.4 37.4*  MCV 89.9 89.2 91.0  PLT 145* 152 150   Cardiac Enzymes: No results for input(s): CKTOTAL, CKMB, CKMBINDEX, TROPONINI in the last 168 hours. BNP: Invalid input(s): POCBNP CBG: Recent Labs  Lab 04/27/21 0301   GLUCAP 134*   D-Dimer No results for input(s): DDIMER in the last 72 hours. Hgb A1c No results for input(s): HGBA1C in the last 72 hours. Lipid Profile No results for input(s): CHOL, HDL, LDLCALC, TRIG, CHOLHDL, LDLDIRECT in the last 72 hours. Thyroid function studies No results for input(s): TSH, T4TOTAL, T3FREE, THYROIDAB in the last 72 hours.  Invalid input(s): FREET3 Anemia work up No results for input(s): VITAMINB12, FOLATE, FERRITIN, TIBC, IRON, RETICCTPCT in the last 72 hours. Urinalysis    Component Value Date/Time   COLORURINE YELLOW (A) 04/26/2021 1556   APPEARANCEUR CLEAR (A) 04/26/2021 1556   APPEARANCEUR Clear 03/15/2013 1642   LABSPEC 1.017 04/26/2021 1556   LABSPEC 1.016 03/15/2013 1642   PHURINE 6.0 04/26/2021 1556   GLUCOSEU NEGATIVE 04/26/2021 1556   GLUCOSEU Negative 03/15/2013 1642   HGBUR MODERATE (A) 04/26/2021 1556   BILIRUBINUR NEGATIVE 04/26/2021 1556   BILIRUBINUR Negative 03/15/2013 1642   KETONESUR NEGATIVE 04/26/2021 1556   PROTEINUR 30 (A) 04/26/2021 1556   NITRITE NEGATIVE 04/26/2021 1556   LEUKOCYTESUR NEGATIVE 04/26/2021 1556   LEUKOCYTESUR 1+ 03/15/2013 1642   Sepsis Labs Invalid input(s): PROCALCITONIN,  WBC,  LACTICIDVEN Microbiology Recent Results (from the past 240 hour(s))  Resp Panel by RT-PCR (Flu A&B, Covid) Nasopharyngeal Swab     Status: Abnormal   Collection Time: 04/26/21  3:51 PM   Specimen: Nasopharyngeal Swab; Nasopharyngeal(NP) swabs in vial transport medium  Result Value Ref Range Status   SARS Coronavirus 2 by RT PCR NEGATIVE NEGATIVE Final    Comment: (NOTE) SARS-CoV-2 target nucleic acids are NOT DETECTED.  The SARS-CoV-2 RNA is generally detectable in upper respiratory specimens during the acute phase of infection. The lowest concentration of SARS-CoV-2 viral copies this assay can detect is 138 copies/mL. A negative result does not preclude SARS-Cov-2 infection and should not be used as the sole basis for  treatment or other patient management decisions. A negative result may occur with  improper specimen collection/handling, submission of specimen other than nasopharyngeal swab, presence of viral  mutation(s) within the areas targeted by this assay, and inadequate number of viral copies(<138 copies/mL). A negative result must be combined with clinical observations, patient history, and epidemiological information. The expected result is Negative.  Fact Sheet for Patients:  BloggerCourse.com  Fact Sheet for Healthcare Providers:  SeriousBroker.it  This test is no t yet approved or cleared by the [place] FDA and  has been authorized for detection and/or diagnosis of SARS-CoV-2 by FDA under an Emergency Use Authorization (EUA). This EUA will remain  in effect (meaning this test can be used) for the duration of the COVID-19 declaration under Section 564(b)(1) of the Act, 21 U.S.C.section 360bbb-3(b)(1), unless the authorization is terminated  or revoked sooner.       Influenza A by PCR POSITIVE (A) NEGATIVE Final   Influenza B by PCR NEGATIVE NEGATIVE Final    Comment: (NOTE) The Xpert Xpress SARS-CoV-2/FLU/RSV plus assay is intended as an aid in the diagnosis of influenza from Nasopharyngeal swab specimens and should not be used as a sole basis for treatment. Nasal washings and aspirates are unacceptable for Xpert Xpress SARS-CoV-2/FLU/RSV testing.  Fact Sheet for Patients: BloggerCourse.com  Fact Sheet for Healthcare Providers: SeriousBroker.it  This test is not yet approved or cleared by the [place] FDA and has been authorized for detection and/or diagnosis of SARS-CoV-2 by FDA under an Emergency Use Authorization (EUA). This EUA will remain in effect (meaning this test can be used) for the duration of the COVID-19 declaration under Section 564(b)(1) of the Act, 21  U.S.C. section 360bbb-3(b)(1), unless the authorization is terminated or revoked.  Performed at [facility], [address] [place], [place] [address]   Blood Culture (routine x 2)     Status: None (Preliminary result)   Collection Time: 04/26/21  4:10 PM   Specimen: BLOOD  Result Value Ref Range Status   Specimen Description BLOOD RIGHT ANTECUBITAL  Final   Special Requests   Final    BOTTLES DRAWN AEROBIC AND ANAEROBIC Blood Culture adequate volume   Culture   Final    NO GROWTH 4 DAYS Performed at [facility], [address], [place], [place] [address]    Report Status PENDING  Incomplete  Blood Culture (routine x 2)     Status: None (Preliminary result)   Collection Time: 04/26/21  4:15 PM   Specimen: BLOOD  Result Value Ref Range Status   Specimen Description BLOOD LEFT ANTECUBITAL  Final   Special Requests   Final    BOTTLES DRAWN AEROBIC AND ANAEROBIC Blood Culture adequate volume   Culture   Final    NO GROWTH 4 DAYS Performed at [facility], [address] [place], [place] [address]    Report Status PENDING  Incomplete     Time coordinating discharge: Over 30 minutes  SIGNED:   Pennie Banter, DO [facility] 04/30/2021, 11:47 AM   If 7PM-7AM, please contact night-coverage [email or web address]

## 2021-04-30 NOTE — Plan of Care (Signed)
Patient going home on 2L [place]

## 2021-04-30 NOTE — Progress Notes (Signed)
When tech wheeled patient out to meet his ride there was no oxygen present.  Educated patient and ride on the need for oxygen and that he was not going to have oxygen until Monday at the earliest.  Suggested patient stay till Monday.  Patient said he "was not waiting until Monday he will get oxygen just as he always has".

## 2021-04-30 NOTE — TOC Transition Note (Signed)
Transition of Care [facility]) - CM/SW Discharge Note   Patient Details  Name: TERREL NESHEIWAT MRN: [id number] Date of Birth: [birth year]  Transition of Care [facility] CM/SW Contact:  Elliot Gurney [place], [place] Phone Number: 04/30/2021, 2:31 PM   Clinical Narrative:            Patient scheduled for discharge today. Met with patient at bedside to inform patient of Oxygen ordered through [employer]. Patient states that he lives alone and is followed by the [facility]. Prescriptions also filled by the [facility]. Patient states that a friend of his will provide ride home. [employer] contacted, spoke with [facility], order placed for Oxygen at 11:36 pm. Oxygen to be delivered before discharge home today.   [place], LCSW Transition of Care [phone]    Final next level of care: Home/Self Care Barriers to Discharge: No Barriers Identified   Patient Goals and CMS Choice Patient states their goals for this hospitalization and ongoing recovery are:: "I am ready to go home"      Discharge Placement                       Discharge Plan and Services In-house Referral: Clinical Social Work   Post Acute Care Choice: Durable Medical Equipment          DME Arranged: Oxygen DME Agency: [employer] Date DME Agency Contacted: 04/30/21 Time DME Agency Contacted: [phone] Representative spoke with at DME Agency: [facility] ([place]) Interventions     Readmission Risk Interventions No flowsheet data found.

## 2021-05-01 LAB — CULTURE, BLOOD (ROUTINE X 2)
Culture: NO GROWTH
Culture: NO GROWTH
Special Requests: ADEQUATE
Special Requests: ADEQUATE

## 2021-05-02 ENCOUNTER — Other Ambulatory Visit: Payer: Self-pay

## 2021-05-02 ENCOUNTER — Inpatient Hospital Stay
Admission: EM | Admit: 2021-05-02 | Discharge: 2021-05-12 | DRG: 190 | Disposition: E | Payer: No Typology Code available for payment source | Attending: Internal Medicine | Admitting: Internal Medicine

## 2021-05-02 ENCOUNTER — Emergency Department: Payer: No Typology Code available for payment source

## 2021-05-02 ENCOUNTER — Encounter: Payer: Self-pay | Admitting: Family Medicine

## 2021-05-02 DIAGNOSIS — Z885 Allergy status to narcotic agent status: Secondary | ICD-10-CM

## 2021-05-02 DIAGNOSIS — Z7951 Long term (current) use of inhaled steroids: Secondary | ICD-10-CM | POA: Diagnosis not present

## 2021-05-02 DIAGNOSIS — Z7982 Long term (current) use of aspirin: Secondary | ICD-10-CM

## 2021-05-02 DIAGNOSIS — J441 Chronic obstructive pulmonary disease with (acute) exacerbation: Principal | ICD-10-CM | POA: Diagnosis present

## 2021-05-02 DIAGNOSIS — K219 Gastro-esophageal reflux disease without esophagitis: Secondary | ICD-10-CM | POA: Diagnosis present

## 2021-05-02 DIAGNOSIS — J440 Chronic obstructive pulmonary disease with acute lower respiratory infection: Secondary | ICD-10-CM

## 2021-05-02 DIAGNOSIS — J209 Acute bronchitis, unspecified: Secondary | ICD-10-CM | POA: Diagnosis not present

## 2021-05-02 DIAGNOSIS — I2510 Atherosclerotic heart disease of native coronary artery without angina pectoris: Secondary | ICD-10-CM | POA: Diagnosis present

## 2021-05-02 DIAGNOSIS — J9622 Acute and chronic respiratory failure with hypercapnia: Secondary | ICD-10-CM | POA: Diagnosis present

## 2021-05-02 DIAGNOSIS — F17210 Nicotine dependence, cigarettes, uncomplicated: Secondary | ICD-10-CM | POA: Diagnosis present

## 2021-05-02 DIAGNOSIS — F064 Anxiety disorder due to known physiological condition: Secondary | ICD-10-CM | POA: Diagnosis present

## 2021-05-02 DIAGNOSIS — E782 Mixed hyperlipidemia: Secondary | ICD-10-CM | POA: Diagnosis present

## 2021-05-02 DIAGNOSIS — R0602 Shortness of breath: Secondary | ICD-10-CM

## 2021-05-02 DIAGNOSIS — Z9981 Dependence on supplemental oxygen: Secondary | ICD-10-CM

## 2021-05-02 DIAGNOSIS — Z91013 Allergy to seafood: Secondary | ICD-10-CM

## 2021-05-02 DIAGNOSIS — E43 Unspecified severe protein-calorie malnutrition: Secondary | ICD-10-CM | POA: Diagnosis present

## 2021-05-02 DIAGNOSIS — F4310 Post-traumatic stress disorder, unspecified: Secondary | ICD-10-CM | POA: Diagnosis present

## 2021-05-02 DIAGNOSIS — J9621 Acute and chronic respiratory failure with hypoxia: Secondary | ICD-10-CM | POA: Diagnosis present

## 2021-05-02 DIAGNOSIS — Z515 Encounter for palliative care: Secondary | ICD-10-CM

## 2021-05-02 DIAGNOSIS — I5032 Chronic diastolic (congestive) heart failure: Secondary | ICD-10-CM | POA: Diagnosis present

## 2021-05-02 DIAGNOSIS — F411 Generalized anxiety disorder: Secondary | ICD-10-CM | POA: Diagnosis present

## 2021-05-02 DIAGNOSIS — I110 Hypertensive heart disease with heart failure: Secondary | ICD-10-CM | POA: Diagnosis present

## 2021-05-02 DIAGNOSIS — Z79899 Other long term (current) drug therapy: Secondary | ICD-10-CM

## 2021-05-02 DIAGNOSIS — Z7189 Other specified counseling: Secondary | ICD-10-CM

## 2021-05-02 DIAGNOSIS — M1990 Unspecified osteoarthritis, unspecified site: Secondary | ICD-10-CM | POA: Diagnosis present

## 2021-05-02 DIAGNOSIS — Z20822 Contact with and (suspected) exposure to covid-19: Secondary | ICD-10-CM | POA: Diagnosis present

## 2021-05-02 DIAGNOSIS — Z681 Body mass index (BMI) 19 or less, adult: Secondary | ICD-10-CM

## 2021-05-02 DIAGNOSIS — R079 Chest pain, unspecified: Secondary | ICD-10-CM | POA: Diagnosis present

## 2021-05-02 DIAGNOSIS — R64 Cachexia: Secondary | ICD-10-CM | POA: Diagnosis present

## 2021-05-02 DIAGNOSIS — Z66 Do not resuscitate: Secondary | ICD-10-CM | POA: Diagnosis not present

## 2021-05-02 DIAGNOSIS — J101 Influenza due to other identified influenza virus with other respiratory manifestations: Secondary | ICD-10-CM | POA: Diagnosis present

## 2021-05-02 DIAGNOSIS — I252 Old myocardial infarction: Secondary | ICD-10-CM | POA: Diagnosis not present

## 2021-05-02 DIAGNOSIS — Z888 Allergy status to other drugs, medicaments and biological substances status: Secondary | ICD-10-CM

## 2021-05-02 LAB — BLOOD GAS, VENOUS
Acid-Base Excess: 13.4 mmol/L — ABNORMAL HIGH (ref 0.0–2.0)
Bicarbonate: 40.9 mmol/L — ABNORMAL HIGH (ref 20.0–28.0)
Delivery systems: POSITIVE
FIO2: 0.6
O2 Saturation: 56.6 %
PEEP: 5 cmH2O
Patient temperature: 37
Pressure support: 12 cmH2O
pCO2, Ven: 63 mmHg — ABNORMAL HIGH (ref 44.0–60.0)
pH, Ven: 7.42 (ref 7.250–7.430)
pO2, Ven: <31 mmHg — CL (ref 32.0–45.0)

## 2021-05-02 LAB — CBC WITH DIFFERENTIAL/PLATELET
Abs Immature Granulocytes: 0.04 10*3/uL (ref 0.00–0.07)
Basophils Absolute: 0 10*3/uL (ref 0.0–0.1)
Basophils Relative: 0 %
Eosinophils Absolute: 0 10*3/uL (ref 0.0–0.5)
Eosinophils Relative: 0 %
HCT: 41.3 % (ref 39.0–52.0)
Hemoglobin: 13.6 g/dL (ref 13.0–17.0)
Immature Granulocytes: 1 %
Lymphocytes Relative: 7 %
Lymphs Abs: 0.3 10*3/uL — ABNORMAL LOW (ref 0.7–4.0)
MCH: 29.9 pg (ref 26.0–34.0)
MCHC: 32.9 g/dL (ref 30.0–36.0)
MCV: 90.8 fL (ref 80.0–100.0)
Monocytes Absolute: 0.3 10*3/uL (ref 0.1–1.0)
Monocytes Relative: 6 %
Neutro Abs: 3.9 10*3/uL (ref 1.7–7.7)
Neutrophils Relative %: 86 %
Platelets: 152 10*3/uL (ref 150–400)
RBC: 4.55 MIL/uL (ref 4.22–5.81)
RDW: 12.6 % (ref 11.5–15.5)
WBC: 4.6 10*3/uL (ref 4.0–10.5)
nRBC: 0 % (ref 0.0–0.2)

## 2021-05-02 LAB — BASIC METABOLIC PANEL
Anion gap: 8 (ref 5–15)
BUN: 22 mg/dL (ref 8–23)
CO2: 32 mmol/L (ref 22–32)
Calcium: 8.5 mg/dL — ABNORMAL LOW (ref 8.9–10.3)
Chloride: 98 mmol/L (ref 98–111)
Creatinine, Ser: 0.63 mg/dL (ref 0.61–1.24)
GFR, Estimated: >60 mL/min (ref >60)
Glucose, Bld: 186 mg/dL — ABNORMAL HIGH (ref 70–99)
Potassium: 3.9 mmol/L (ref 3.5–5.1)
Sodium: 138 mmol/L (ref 135–145)

## 2021-05-02 LAB — COMPREHENSIVE METABOLIC PANEL
ALT: 28 U/L (ref 0–44)
AST: 24 U/L (ref 15–41)
Albumin: 4 g/dL (ref 3.5–5.0)
Alkaline Phosphatase: 66 U/L (ref 38–126)
Anion gap: 10 (ref 5–15)
BUN: 23 mg/dL (ref 8–23)
CO2: 34 mmol/L — ABNORMAL HIGH (ref 22–32)
Calcium: 8.8 mg/dL — ABNORMAL LOW (ref 8.9–10.3)
Chloride: 95 mmol/L — ABNORMAL LOW (ref 98–111)
Creatinine, Ser: 0.72 mg/dL (ref 0.61–1.24)
GFR, Estimated: >60 mL/min (ref >60)
Glucose, Bld: 174 mg/dL — ABNORMAL HIGH (ref 70–99)
Potassium: 4.1 mmol/L (ref 3.5–5.1)
Sodium: 139 mmol/L (ref 135–145)
Total Bilirubin: 0.7 mg/dL (ref 0.3–1.2)
Total Protein: 7.4 g/dL (ref 6.5–8.1)

## 2021-05-02 LAB — CBC
HCT: 37 % — ABNORMAL LOW (ref 39.0–52.0)
Hemoglobin: 12.4 g/dL — ABNORMAL LOW (ref 13.0–17.0)
MCH: 30.1 pg (ref 26.0–34.0)
MCHC: 33.5 g/dL (ref 30.0–36.0)
MCV: 89.8 fL (ref 80.0–100.0)
Platelets: 137 10*3/uL — ABNORMAL LOW (ref 150–400)
RBC: 4.12 MIL/uL — ABNORMAL LOW (ref 4.22–5.81)
RDW: 12.6 % (ref 11.5–15.5)
WBC: 3.4 10*3/uL — ABNORMAL LOW (ref 4.0–10.5)
nRBC: 0 % (ref 0.0–0.2)

## 2021-05-02 LAB — TROPONIN I (HIGH SENSITIVITY)
Troponin I (High Sensitivity): 17 ng/L (ref <18)
Troponin I (High Sensitivity): 18 ng/L — ABNORMAL HIGH (ref <18)

## 2021-05-02 LAB — RESP PANEL BY RT-PCR (FLU A&B, COVID) ARPGX2
Influenza A by PCR: POSITIVE — AB
Influenza B by PCR: NEGATIVE
SARS Coronavirus 2 by RT PCR: NEGATIVE

## 2021-05-02 MED ORDER — CLONIDINE HCL 0.1 MG PO TABS: 0.1000 mg | ORAL_TABLET | ORAL | Status: DC | PRN

## 2021-05-02 MED ORDER — PREDNISONE 20 MG PO TABS
40.0000 mg | ORAL_TABLET | Freq: Every day | ORAL | Status: DC
  Administered 2021-05-03: 40 mg via ORAL
  Filled 2021-05-02: qty 2

## 2021-05-02 MED ORDER — ACETAMINOPHEN 325 MG PO TABS: 650.0000 mg | ORAL_TABLET | Freq: Four times a day (QID) | ORAL | Status: DC | PRN

## 2021-05-02 MED ORDER — ENOXAPARIN SODIUM 40 MG/0.4ML IJ SOSY
40.0000 mg | PREFILLED_SYRINGE | INTRAMUSCULAR | Status: DC
  Administered 2021-05-02 – 2021-05-04 (×3): 40 mg via SUBCUTANEOUS
  Filled 2021-05-02 (×3): qty 0.4

## 2021-05-02 MED ORDER — GUAIFENESIN ER 600 MG PO TB12
600.0000 mg | ORAL_TABLET | Freq: Two times a day (BID) | ORAL | Status: DC
  Administered 2021-05-02 – 2021-05-04 (×6): 600 mg via ORAL
  Filled 2021-05-02 (×6): qty 1

## 2021-05-02 MED ORDER — SODIUM CHLORIDE 0.9 % IV SOLN: INTRAVENOUS | Status: DC

## 2021-05-02 MED ORDER — SIMVASTATIN 20 MG PO TABS
20.0000 mg | ORAL_TABLET | Freq: Every evening | ORAL | Status: DC
  Administered 2021-05-02 – 2021-05-04 (×3): 20 mg via ORAL
  Filled 2021-05-02: qty 1
  Filled 2021-05-02: qty 2
  Filled 2021-05-02: qty 1

## 2021-05-02 MED ORDER — PANTOPRAZOLE SODIUM 40 MG PO TBEC
40.0000 mg | DELAYED_RELEASE_TABLET | Freq: Every day | ORAL | Status: DC
  Administered 2021-05-02 – 2021-05-04 (×3): 40 mg via ORAL
  Filled 2021-05-02 (×3): qty 1

## 2021-05-02 MED ORDER — TRAMADOL HCL 50 MG PO TABS
50.0000 mg | ORAL_TABLET | Freq: Four times a day (QID) | ORAL | Status: DC | PRN
  Administered 2021-05-02 – 2021-05-04 (×4): 50 mg via ORAL
  Filled 2021-05-02 (×4): qty 1

## 2021-05-02 MED ORDER — TIZANIDINE HCL 4 MG PO TABS
8.0000 mg | ORAL_TABLET | Freq: Every day | ORAL | Status: DC
  Administered 2021-05-02 – 2021-05-03 (×2): 8 mg via ORAL
  Filled 2021-05-02 (×4): qty 2

## 2021-05-02 MED ORDER — ALBUTEROL SULFATE (2.5 MG/3ML) 0.083% IN NEBU
15.0000 mg | INHALATION_SOLUTION | Freq: Once | RESPIRATORY_TRACT | Status: AC
  Administered 2021-05-02: 15 mg via RESPIRATORY_TRACT
  Filled 2021-05-02 (×2): qty 18

## 2021-05-02 MED ORDER — DM-GUAIFENESIN ER 30-600 MG PO TB12: 1.0000 | ORAL_TABLET | Freq: Two times a day (BID) | ORAL | Status: DC

## 2021-05-02 MED ORDER — METOPROLOL TARTRATE 50 MG PO TABS
50.0000 mg | ORAL_TABLET | Freq: Two times a day (BID) | ORAL | Status: DC
  Administered 2021-05-02 – 2021-05-04 (×4): 50 mg via ORAL
  Filled 2021-05-02 (×5): qty 1

## 2021-05-02 MED ORDER — SODIUM CHLORIDE 0.9 % IV SOLN
1.0000 g | INTRAVENOUS | Status: DC
  Administered 2021-05-02 – 2021-05-05 (×4): 1 g via INTRAVENOUS
  Filled 2021-05-02: qty 10
  Filled 2021-05-02: qty 1
  Filled 2021-05-02 (×2): qty 10

## 2021-05-02 MED ORDER — LABETALOL HCL 5 MG/ML IV SOLN
10.0000 mg | Freq: Once | INTRAVENOUS | Status: DC
  Filled 2021-05-02: qty 4

## 2021-05-02 MED ORDER — ACETAMINOPHEN 650 MG RE SUPP
650.0000 mg | Freq: Four times a day (QID) | RECTAL | Status: DC | PRN
  Filled 2021-05-02: qty 1

## 2021-05-02 MED ORDER — OSELTAMIVIR PHOSPHATE 75 MG PO CAPS
75.0000 mg | ORAL_CAPSULE | Freq: Two times a day (BID) | ORAL | Status: AC
  Administered 2021-05-02 (×2): 75 mg via ORAL
  Filled 2021-05-02 (×2): qty 1

## 2021-05-02 MED ORDER — METHYLPREDNISOLONE SODIUM SUCC 125 MG IJ SOLR
125.0000 mg | Freq: Once | INTRAMUSCULAR | Status: AC
  Administered 2021-05-02: 125 mg via INTRAVENOUS
  Filled 2021-05-02: qty 2

## 2021-05-02 MED ORDER — METHYLPREDNISOLONE SODIUM SUCC 40 MG IJ SOLR
40.0000 mg | Freq: Two times a day (BID) | INTRAMUSCULAR | Status: AC
  Administered 2021-05-02 (×2): 40 mg via INTRAVENOUS
  Filled 2021-05-02 (×2): qty 1

## 2021-05-02 MED ORDER — METHOCARBAMOL 500 MG PO TABS
500.0000 mg | ORAL_TABLET | Freq: Three times a day (TID) | ORAL | Status: DC | PRN
  Filled 2021-05-02: qty 1

## 2021-05-02 MED ORDER — ASPIRIN EC 81 MG PO TBEC
81.0000 mg | DELAYED_RELEASE_TABLET | Freq: Every day | ORAL | Status: DC
  Administered 2021-05-02 – 2021-05-04 (×3): 81 mg via ORAL
  Filled 2021-05-02 (×3): qty 1

## 2021-05-02 MED ORDER — HYDRALAZINE HCL 50 MG PO TABS
50.0000 mg | ORAL_TABLET | Freq: Two times a day (BID) | ORAL | Status: DC
  Administered 2021-05-02 – 2021-05-04 (×5): 50 mg via ORAL
  Filled 2021-05-02 (×5): qty 1

## 2021-05-02 MED ORDER — MAGNESIUM HYDROXIDE 400 MG/5ML PO SUSP: 30.0000 mL | Freq: Every day | ORAL | Status: DC | PRN

## 2021-05-02 MED ORDER — ENSURE ENLIVE PO LIQD
237.0000 mL | Freq: Three times a day (TID) | ORAL | Status: DC
  Administered 2021-05-02 – 2021-05-05 (×6): 237 mL via ORAL

## 2021-05-02 MED ORDER — LISINOPRIL 20 MG PO TABS
40.0000 mg | ORAL_TABLET | Freq: Every day | ORAL | Status: DC
  Administered 2021-05-02 – 2021-05-04 (×3): 40 mg via ORAL
  Filled 2021-05-02: qty 2
  Filled 2021-05-02: qty 4
  Filled 2021-05-02: qty 2

## 2021-05-02 MED ORDER — ADULT MULTIVITAMIN W/MINERALS CH
1.0000 | ORAL_TABLET | Freq: Every day | ORAL | Status: DC
  Administered 2021-05-02 – 2021-05-04 (×3): 1 via ORAL
  Filled 2021-05-02 (×3): qty 1

## 2021-05-02 MED ORDER — IPRATROPIUM-ALBUTEROL 0.5-2.5 (3) MG/3ML IN SOLN
3.0000 mL | Freq: Four times a day (QID) | RESPIRATORY_TRACT | Status: DC
  Administered 2021-05-02 – 2021-05-04 (×10): 3 mL via RESPIRATORY_TRACT
  Filled 2021-05-02 (×12): qty 3

## 2021-05-02 MED ORDER — MELOXICAM 7.5 MG PO TABS
15.0000 mg | ORAL_TABLET | Freq: Every day | ORAL | Status: DC
  Administered 2021-05-02 – 2021-05-04 (×3): 15 mg via ORAL
  Filled 2021-05-02 (×3): qty 2

## 2021-05-02 MED ORDER — POLYVINYL ALCOHOL 1.4 % OP SOLN
1.0000 [drp] | Freq: Three times a day (TID) | OPHTHALMIC | Status: DC | PRN
  Filled 2021-05-02: qty 15

## 2021-05-02 NOTE — ED Triage Notes (Signed)
Pt presents to ER via ems from home.  Pt was admitted and discharged from here on 11/19 for COPD exacerbation.  Pt became SOB tonight and had run out of inhaler at home.  Pt's O2 sats with ems on RA was 85%.  Pt given 2 albuterol and 1 duoneb pta.  Pt accessory muscle breathing on arrival.  Pt states he sometimes wears 2L [place] at home.

## 2021-05-02 NOTE — ED Notes (Signed)
Pt eating at this time.

## 2021-05-02 NOTE — ED Notes (Signed)
RT in room to place pt on BiPAP.

## 2021-05-02 NOTE — ED Notes (Signed)
Pt requested to be off bipap to eat. Pt placed on 4L [place].

## 2021-05-02 NOTE — ED Notes (Signed)
This RN, Rosalie Doctor RN, and Sam RN changed pt's bedding/gown/chuck pad at this time, cleaned up pt, switched this pt to [place] 4L/min, placed pt's lunch at their bedside, and ensured call bell was still within reach at this time.

## 2021-05-02 NOTE — ED Provider Notes (Signed)
[facility]  ____________________________________________   Event Date/Time   First MD Initiated Contact with Patient 04/30/2021 0111     (approximate)  I have reviewed the triage vital signs and the nursing notes.   HISTORY  Chief Complaint Shortness of Breath    HPI Ian Herman is a 75 y.o. male with past medical history of COPD not on home oxygen, diastolic heart failure, hypertension who presents with respiratory distress.  Patient was recently discharged from the hospital 2 days ago for COPD exacerbation.  He ran out of his albuterol inhaler.  Shortness of breath started acutely tonight.  He denies associated chest pain cough fevers or chills.  Denies lower extremity edema nausea vomiting.  During patient's recent hospitalization he was positive for influenza a.  He was intermittently on BiPAP but did not require oxygen at discharge.  When EMS arrived patient was saturating 86% on room air.  He was given albuterol nebs.         Past Medical History:  Diagnosis Date   Arthritis    Bronchitis    CHF (congestive heart failure) (HCC)    COPD (chronic obstructive pulmonary disease) (HCC)    Coronary artery disease    Depression    GERD (gastroesophageal reflux disease)    Hepatitis C    Hypertension    Myocardial infarction acute (HCC)    Pleural effusion    PTSD (post-traumatic stress disorder)    Shortness of breath dyspnea     Patient Active Problem List   Diagnosis Date Noted   Influenza A 04/26/2021   Elevated brain natriuretic peptide (BNP) level 04/26/2021   Elevated troponin 04/26/2021   Lactic acidosis 04/26/2021   Hyponatremia 04/26/2021   GERD (gastroesophageal reflux disease) 04/26/2021   Hypertensive urgency 04/26/2021   RUQ abdominal pain 03/22/2021   Chest pain 03/22/2021   Gallstone 03/22/2021   Acute on chronic respiratory failure with hypoxia (HCC) 12/25/2020   Coronary artery disease    Depression    Essential  hypertension    Mixed hyperlipidemia    CAP (community acquired pneumonia)    Severe sepsis (HCC)    Tobacco abuse    Chronic diastolic CHF (congestive heart failure) (HCC)    COPD exacerbation (HCC)    Anxiety state    Advanced care planning/counseling discussion    Goals of care, counseling/discussion    Palliative care by specialist    Acute on chronic respiratory failure with hypoxia and hypercapnia (HCC) 08/02/2018   Protein-calorie malnutrition, severe 08/02/2018    Past Surgical History:  Procedure Laterality Date   APPENDECTOMY     CORONARY ANGIOPLASTY     stent   INGUINAL HERNIA REPAIR Left 04/23/2015   Procedure: HERNIA REPAIR INGUINAL ADULT;  Surgeon: Nadeen Landau, MD;  Location: [facility] ORS;  Service: General;  Laterality: Left;    Prior to Admission medications   Medication Sig Start Date End Date Taking? Authorizing Provider  acetaminophen (TYLENOL) 325 MG tablet Take 2 tablets (650 mg total) by mouth every 6 (six) hours as needed for mild pain or fever. 03/24/21   Gillis Santa, MD  albuterol (ACCUNEB) 0.63 MG/3ML nebulizer solution Take 1 ampule by nebulization every 6 (six) hours as needed for wheezing.    [provider]  aspirin EC 81 MG tablet Take 81 mg by mouth daily.    [provider]  carboxymethylcellulose (REFRESH PLUS) 0.5 % SOLN Apply 1 drop to eye 3 (three) times daily as needed (dry  eyes).    [provider]  dextromethorphan-guaiFENesin (MUCINEX DM) 30-600 MG 12hr tablet Take 1 tablet by mouth 2 (two) times daily for 7 days. 04/30/21 05/07/21  Esaw Grandchild A, DO  fluticasone-salmeterol (ADVAIR) 250-50 MCG/ACT AEPB Inhale 1 puff into the lungs in the morning and at bedtime. 01/11/20   [provider]  hydrALAZINE (APRESOLINE) 50 MG tablet Take 1 tablet by mouth in the morning and at bedtime. 01/28/21   [provider]  Ipratropium-Albuterol (COMBIVENT) 20-100 MCG/ACT AERS respimat Inhale 1 puff into the  lungs every 6 (six) hours as needed for wheezing or shortness of breath.    [provider]  ipratropium-albuterol (DUONEB) 0.5-2.5 (3) MG/3ML SOLN Take 3 mLs by nebulization every 6 (six) hours for 3 days. 08/07/18 12/25/20  Auburn Bilberry, MD  lisinopril (ZESTRIL) 40 MG tablet Take 1 tablet by mouth daily. 01/28/21   [provider]  meloxicam (MOBIC) 15 MG tablet Take 1 tablet by mouth daily. 01/28/21   [provider]  metoprolol tartrate (LOPRESSOR) 50 MG tablet Take 1 tablet (50 mg total) by mouth 2 (two) times daily. 12/29/20   Tyrone Nine, MD  Multiple Vitamin (MULTIVITAMIN WITH MINERALS) TABS tablet Take 1 tablet by mouth daily. 05/01/21   Pennie Banter, DO  Nutritional Supplements (ENSURE PLUS PO) Take 1 Can by mouth See admin instructions. Consume contents of 1 can every day separate from meals. Do not use as meal replacement. 01/27/20   [provider]  omeprazole (PRILOSEC) 20 MG capsule Take 1 capsule by mouth daily. 01/28/21   [provider]  oseltamivir (TAMIFLU) 75 MG capsule Take 1 capsule (75 mg total) by mouth 2 (two) times daily for 4 doses. 04/30/21 04/12/2021  Pennie Banter, DO  predniSONE (DELTASONE) 10 MG tablet Take 3 tablets (30 mg total) by mouth daily with breakfast for 2 days, THEN 2 tablets (20 mg total) daily with breakfast for 2 days, THEN 1 tablet (10 mg total) daily with breakfast for 2 days. 04/30/21 05/06/21  Pennie Banter, DO  sildenafil (VIAGRA) 100 MG tablet Take 100 mg by mouth as needed for erectile dysfunction.    [provider]  simvastatin (ZOCOR) 20 MG tablet Take 20 mg by mouth every evening. Patient not taking: Reported on 04/27/2021    [provider]  tiZANidine (ZANAFLEX) 4 MG tablet Take 2 tablets by mouth at bedtime. 01/28/21   [provider]    Allergies Shellfish allergy, Trazodone, Chlorpromazine, and Codeine  Family History  Problem Relation Age of Onset   Lung  cancer Sister     Social History Social History   Tobacco Use   Smoking status: Every Day    Packs/day: 0.50    Types: Cigarettes   Smokeless tobacco: Never  Substance Use Topics   Alcohol use: Yes    Comment: occasionally per patient   Drug use: No    Comment: IV drug use history mult. years ago.    Review of Systems   Review of Systems  Constitutional:  Negative for appetite change and fever.  Respiratory:  Positive for shortness of breath. Negative for cough and chest tightness.   Cardiovascular:  Negative for chest pain.  Gastrointestinal:  Negative for abdominal pain, nausea and vomiting.  All other systems reviewed and are negative.  Physical Exam Updated Vital Signs BP 111/79   Pulse (!) 111   Temp (!) 97.5 F (36.4 C) (Oral)   Resp 18   Wt 50  kg   SpO2 100%   BMI 16.76 kg/m   Physical Exam Vitals and nursing note reviewed.  Constitutional:      General: He is in acute distress.     Appearance: Normal appearance.     Comments: Patient is cachectic, acute respiratory distress  HENT:     Head: Normocephalic and atraumatic.  Eyes:     General: No scleral icterus.    Conjunctiva/sclera: Conjunctivae normal.  Cardiovascular:     Rate and Rhythm: Regular rhythm. Tachycardia present.  Pulmonary:     Effort: Respiratory distress present.     Breath sounds: Wheezing present.     Comments: Patient is in moderate respiratory distress, tachypneic, using accessory muscles but able to speak in short sentences  Expiratory wheezing throughout with prolonged expiratory phase Musculoskeletal:        General: No deformity or signs of injury.     Cervical back: Normal range of motion.     Right lower leg: No edema.     Left lower leg: No edema.  Skin:    Capillary Refill: Capillary refill takes less than 2 seconds.     Coloration: Skin is not jaundiced or pale.  Neurological:     General: No focal deficit present.     Mental Status: He is alert and oriented to  person, place, and time. Mental status is at baseline.  Psychiatric:        Mood and Affect: Mood normal.        Behavior: Behavior normal.     LABS (all labs ordered are listed, but only abnormal results are displayed)  Labs Reviewed  RESP PANEL BY RT-PCR (FLU A&B, COVID) ARPGX2 - Abnormal; Notable for the following components:      Result Value   Influenza A by PCR POSITIVE (*)    All other components within normal limits  COMPREHENSIVE METABOLIC PANEL - Abnormal; Notable for the following components:   Chloride 95 (*)    CO2 34 (*)    Glucose, Bld 174 (*)    Calcium 8.8 (*)    All other components within normal limits  CBC WITH DIFFERENTIAL/PLATELET - Abnormal; Notable for the following components:   Lymphs Abs 0.3 (*)    All other components within normal limits  BLOOD GAS, VENOUS - Abnormal; Notable for the following components:   pCO2, Ven 63 (*)    pO2, Ven <31.0 (*)    Bicarbonate 40.9 (*)    Acid-Base Excess 13.4 (*)    All other components within normal limits  TROPONIN I (HIGH SENSITIVITY)  TROPONIN I (HIGH SENSITIVITY)   ____________________________________________  EKG Sinus tachycardia, prolonged PR interval, nml axis, no acute ischemic changes ____________________________________________  RADIOLOGY Ky Barban, personally viewed and evaluated these images (plain radiographs) as part of my medical decision making, as well as reviewing the written report by the radiologist.  ED MD interpretation:  I reviewed the CXR which does not show any acute cardiopulmonary process      ____________________________________________   PROCEDURES  Procedure(s) performed (including Critical Care):  .Critical Care Performed by: Georga Hacking, MD Authorized by: Georga Hacking, MD   Critical care provider statement:    Critical care time (minutes):  30   Critical care was time spent personally by me on the following activities:  Development of  treatment plan with patient or surrogate, discussions with consultants, evaluation of patient's response to treatment, examination of patient, ordering and review of laboratory studies, ordering  and review of radiographic studies, ordering and performing treatments and interventions, pulse oximetry, re-evaluation of patient's condition and review of old charts   ____________________________________________   INITIAL IMPRESSION / ASSESSMENT AND PLAN / ED COURSE   75 year old male presenting with respiratory distress.  Patient was just discharged after admission for COPD exacerbation 2 days ago.  Apparently did not have his albuterol at home.  His shortness of breath started tonight.  Was hypoxic on room air with EMS.  On arrival patient is working significantly so placed on BiPAP with significant improvement in work of breathing.  He is wheezing diffusely he has no other complaints at this time including no infectious symptoms and no chest pain.  We will treat as COPD exacerbation with Solu-Medrol and albuterol nebs.  Chest x-ray does not show any infiltrate.  Patient's EKG does have some depression in the inferior leads but I suspect that this is in the setting of demand as he has no active chest pain and troponin is within normal limits.  Patient's CO2 is mildly elevated at 63 but this is compensated for, pH is 7.42.  The rest of his labs are reassuring.  He is still positive for flu a as was noted during the recent admission.  On reassessment patient appears much more comfortable on BiPAP.  Saturating 100%.  I suggested that we trial him off of BiPAP to nasal cannula but the patient preferred to be on BiPAP while sleeping.  Will admit for further management of his COPD.  Clinical Course as of 04/21/2021 0348  Mon May 02, 2021  0234 Influenza A By PCR(!): POSITIVE [KM]    Clinical Course User Index [KM] Georga Hacking, MD     ____________________________________________   FINAL CLINICAL  IMPRESSION(S) / ED DIAGNOSES  Final diagnoses:  COPD exacerbation [facility])     ED Discharge Orders     None        Note:  This document was prepared using Dragon voice recognition software and may include unintentional dictation errors.    Georga Hacking, MD 05/11/2021 [phone]

## 2021-05-02 NOTE — ED Notes (Signed)
Dr. Imogene Burn paged about PRN HTN meds.

## 2021-05-02 NOTE — ED Notes (Signed)
Pt removed from BiPAP (Ok'd by RT) to eat dinner; Pt placed on 4L [place] and tolerating it well.

## 2021-05-02 NOTE — H&P (Signed)
Ian Herman   PATIENT NAME: Ian Herman    MR#:  [id number]  DATE OF BIRTH:  1945/11/07  DATE OF ADMISSION:  04/26/2021  PRIMARY CARE PHYSICIAN: Louanne Skye, MD   Patient is coming from: Home  REQUESTING/REFERRING PHYSICIAN: Georga Hacking, MD  CHIEF COMPLAINT:   Chief Complaint  Patient presents with  . Shortness of Breath    HISTORY OF PRESENT ILLNESS:  Ian Herman is a 75 y.o. Caucasian male with medical history significant for diastolic CHF, COPD, coronary artery disease, depression, GERD, hepatitis C, hypertension and coronary artery disease, presented to the emergency room with acute onset of worsening cough productive of yellow sputum as well as wheezing and dyspnea over the last couple of days.  He was admitted here from 11/15 till 11/19 for acute influenza, COPD exacerbation and acute respiratory failure and was discharged on 2 L O2 by nasal cannula.  He denied any chest pain or palpitations.  No fever or chills.  No nausea or vomiting or abdominal pain.  He has been 1 more day to finish 5-day course of Tamiflu.  Upon arrival of EMS today's pulse oximetry was 86% on room air.  He was given nebulized albuterol.   ED Course: Upon presenting to the ER, heart rate was 114 with respiratory rate of 30 with pulse currently 100% on O2 mask at 5 L/min.  He was still in respiratory distress and therefore placed on BiPAP at 40% FiO2 with subsequent pulse oximetry of 100%.  Labs revealed a VBG showed pH 7.42, PCO2 of 63 and PO2 less than 31 with HCO3 of 40.9 and O2 sat of 56.6% BMP showed mild hypochloremia and elevated CO2 of 34 with blood glucose of 174.  Calcium was 8.8.  High-sensitivity troponin was 17.  CBC was unremarkable.  Influenza a came back positive and influenza B and COVID-19 PCR came back negative.    EKG as reviewed by me : EKG showed sinus tachycardia with a rate of 115 with PACs, borderline prolonged PR interval, right atrial enlargement and Q  waves anteroseptally. Imaging: Portable chest ray showed COPD changes without acute abnormalities..  The patient was given nebulized Abrol here and 125 mg of IV Solu-Medrol and was placed on BiPAP as mentioned above.  He will be admitted to a progressive unit bed for further evaluation and management. PAST MEDICAL HISTORY:   Past Medical History:  Diagnosis Date  . Arthritis   . Bronchitis   . CHF (congestive heart failure) (HCC)   . COPD (chronic obstructive pulmonary disease) (HCC)   . Coronary artery disease   . Depression   . GERD (gastroesophageal reflux disease)   . Hepatitis C   . Hypertension   . Myocardial infarction acute (HCC)   . Pleural effusion   . PTSD (post-traumatic stress disorder)   . Shortness of breath dyspnea     PAST SURGICAL HISTORY:   Past Surgical History:  Procedure Laterality Date  . APPENDECTOMY    . CORONARY ANGIOPLASTY     stent  . INGUINAL HERNIA REPAIR Left 04/23/2015   Procedure: HERNIA REPAIR INGUINAL ADULT;  Surgeon: Nadeen Landau, MD;  Location: [facility] ORS;  Service: General;  Laterality: Left;    SOCIAL HISTORY:   Social History   Tobacco Use  . Smoking status: Every Day    Packs/day: 0.50    Types: Cigarettes  . Smokeless tobacco: Never  Substance Use Topics  . Alcohol use: Yes  Comment: occasionally per patient    FAMILY HISTORY:   Family History  Problem Relation Age of Onset  . Lung cancer Sister     DRUG ALLERGIES:   Allergies  Allergen Reactions  . Shellfish Allergy   . Trazodone     Other reaction(s): OTHER REACTION, dystonia, OTHER REACTION, dystonia  . Chlorpromazine Hives, Anxiety and Rash    Hives  Other reaction(s): Anxiety, AGITATION, Cramp, MUSCLE SPASMS, OTHER REACTION, PARKINSONIAN-LIKE SYNDROME, UNKNOWN  . Codeine Itching and Rash    itching Other reaction(s): HIVES, SPASISMS, UNKNOWN    REVIEW OF SYSTEMS:   ROS As per history of present illness. All pertinent systems were reviewed  above. Constitutional, HEENT, cardiovascular, respiratory, GI, GU, musculoskeletal, neuro, psychiatric, endocrine, integumentary and hematologic systems were reviewed and are otherwise negative/unremarkable except for positive findings mentioned above in the HPI.   MEDICATIONS AT HOME:   Prior to Admission medications   Medication Sig Start Date End Date Taking? Authorizing Provider  acetaminophen (TYLENOL) 325 MG tablet Take 2 tablets (650 mg total) by mouth every 6 (six) hours as needed for mild pain or fever. 03/24/21   Gillis Santa, MD  albuterol (ACCUNEB) 0.63 MG/3ML nebulizer solution Take 1 ampule by nebulization every 6 (six) hours as needed for wheezing.    [provider]  aspirin EC 81 MG tablet Take 81 mg by mouth daily.    [provider]  carboxymethylcellulose (REFRESH PLUS) 0.5 % SOLN Apply 1 drop to eye 3 (three) times daily as needed (dry eyes).    [provider]  dextromethorphan-guaiFENesin (MUCINEX DM) 30-600 MG 12hr tablet Take 1 tablet by mouth 2 (two) times daily for 7 days. 04/30/21 05/07/21  Esaw Grandchild A, DO  fluticasone-salmeterol (ADVAIR) 250-50 MCG/ACT AEPB Inhale 1 puff into the lungs in the morning and at bedtime. 01/11/20   [provider]  hydrALAZINE (APRESOLINE) 50 MG tablet Take 1 tablet by mouth in the morning and at bedtime. 01/28/21   [provider]  Ipratropium-Albuterol (COMBIVENT) 20-100 MCG/ACT AERS respimat Inhale 1 puff into the lungs every 6 (six) hours as needed for wheezing or shortness of breath.    [provider]  ipratropium-albuterol (DUONEB) 0.5-2.5 (3) MG/3ML SOLN Take 3 mLs by nebulization every 6 (six) hours for 3 days. 08/07/18 12/25/20  Auburn Bilberry, MD  lisinopril (ZESTRIL) 40 MG tablet Take 1 tablet by mouth daily. 01/28/21   [provider]  meloxicam (MOBIC) 15 MG tablet Take 1 tablet by mouth daily. 01/28/21   [provider]  metoprolol tartrate (LOPRESSOR) 50  MG tablet Take 1 tablet (50 mg total) by mouth 2 (two) times daily. 12/29/20   Tyrone Nine, MD  Multiple Vitamin (MULTIVITAMIN WITH MINERALS) TABS tablet Take 1 tablet by mouth daily. 05/01/21   Pennie Banter, DO  Nutritional Supplements (ENSURE PLUS PO) Take 1 Can by mouth See admin instructions. Consume contents of 1 can every day separate from meals. Do not use as meal replacement. 01/27/20   [provider]  omeprazole (PRILOSEC) 20 MG capsule Take 1 capsule by mouth daily. 01/28/21   [provider]  oseltamivir (TAMIFLU) 75 MG capsule Take 1 capsule (75 mg total) by mouth 2 (two) times daily for 4 doses. 04/30/21 04/13/2021  Pennie Banter, DO  predniSONE (DELTASONE) 10 MG tablet Take 3 tablets (30 mg total) by mouth daily with breakfast for 2 days, THEN 2 tablets (20 mg total) daily with breakfast for 2 days, THEN 1 tablet (  10 mg total) daily with breakfast for 2 days. 04/30/21 05/06/21  Pennie Banter, DO  sildenafil (VIAGRA) 100 MG tablet Take 100 mg by mouth as needed for erectile dysfunction.    [provider]  simvastatin (ZOCOR) 20 MG tablet Take 20 mg by mouth every evening. Patient not taking: Reported on 04/27/2021    [provider]  tiZANidine (ZANAFLEX) 4 MG tablet Take 2 tablets by mouth at bedtime. 01/28/21   [provider]      VITAL SIGNS:  Blood pressure 111/79, pulse (!) 111, temperature (!) 97.5 F (36.4 C), temperature source Oral, resp. rate 18, weight 50 kg, SpO2 100 %.  PHYSICAL EXAMINATION:  Physical Exam  GENERAL: Acutely ill 75 y.o.-year-old Caucasian male patient lying in the bed with mild respiratory distress on BiPAP. EYES: Pupils equal, round, reactive to light and accommodation. No scleral icterus. Extraocular muscles intact.  HEENT: Head atraumatic, normocephalic. Oropharynx and nasopharynx clear.  NECK:  Supple, no jugular venous distention. No thyroid enlargement, no tenderness.  LUNGS: Diminished  bibasilar breath sounds with diffuse expiratory wheezes and tight expiratory airflow with harsh vesicular breathing. CARDIOVASCULAR: Regular rate and rhythm, S1, S2 normal. No murmurs, rubs, or gallops.  ABDOMEN: Soft, nondistended, nontender. Bowel sounds present. No organomegaly or mass.  EXTREMITIES: No pedal edema, cyanosis, or clubbing.  NEUROLOGIC: Cranial nerves II through XII are intact. Muscle strength 5/5 in all extremities. Sensation intact. Gait not checked.  PSYCHIATRIC: The patient is alert and oriented x 3.  Normal affect and good eye contact. SKIN: No obvious rash, lesion, or ulcer.   LABORATORY PANEL:   CBC Recent Labs  Lab 05-28-2021 0130  WBC 4.6  HGB 13.6  HCT 41.3  PLT 152   ------------------------------------------------------------------------------------------------------------------  Chemistries  Recent Labs  Lab 04/27/21 0400 04/28/21 0610 05-28-21 0130  NA 135   < > 139  K 4.4   < > 4.1  CL 101   < > 95*  CO2 26   < > 34*  GLUCOSE 146*   < > 174*  BUN 16   < > 23  CREATININE 0.73   < > 0.72  CALCIUM 8.2*   < > 8.8*  MG 2.2  --   --   AST 17  --  24  ALT 9  --  28  ALKPHOS 74  --  66  BILITOT 0.8  --  0.7   < > = values in this interval not displayed.   ------------------------------------------------------------------------------------------------------------------  Cardiac Enzymes No results for input(s): TROPONINI in the last 168 hours. ------------------------------------------------------------------------------------------------------------------  RADIOLOGY:  DG Chest Portable 1 View  Result Date: 28-May-2021 CLINICAL DATA:  Shortness of breath EXAM: PORTABLE CHEST 1 VIEW COMPARISON:  04/26/2021 FINDINGS: Cardiac shadow is within normal limits. Aortic calcifications are again seen. Lungs are hyperinflated consistent with COPD. Chronic scarring is noted bilaterally. No acute abnormality is seen. IMPRESSION: COPD without acute  abnormality. Electronically Signed   By: Alcide Clever M.D.   On: May 28, 2021 01:46      IMPRESSION AND PLAN:  Principal Problem:   COPD exacerbation (HCC)  1.  Acute on chronic hypoxic and possibly hypercarbic respiratory failure requiring BiPAP, due to COPD acute exacerbation, likely secondary to acute bronchitis. - The patient will be admitted to progressive unit bed. - We will continue on BiPAP and follow ABG. - We will continue steroid therapy with IV Solu-Medrol. - We will continue DuoNebs 4 times daily and every 4 hours as needed. - We  will add IV Rocephin and mucolytic therapy. - O2 protocol will be followed. - We will hold off Advair Diskus.  2.  Recent influenza A. - We will continue his course of p.o. Tamiflu.  3.  Essential hypertension. - We will continue his hydralazine, Lopressor and Zestril.  4.  GERD. - We will continue PPI therapy.  5.  Coronary artery disease. - We will continue aspirin and beta-blocker therapy as well as statin therapy.  6.  Dyslipidemia. - Statin will be resumed.  DVT prophylaxis: Lovenox.  Code Status: full code.  Family Communication:  The plan of care was discussed in details with the patient (and family). I answered all questions. The patient agreed to proceed with the above mentioned plan. Further management will depend upon hospital course. Disposition Plan: Back to previous home environment Consults called: none.  All the records are reviewed and case discussed with ED provider.  Status is: Inpatient   Remains inpatient appropriate because:Ongoing diagnostic testing needed not appropriate for outpatient work up, Unsafe d/c plan, IV treatments appropriate due to intensity of illness or inability to take PO, and Inpatient level of care appropriate due to severity of illness   Dispo: The patient is from: Home              Anticipated d/c is to: Home              Patient currently is not medically stable to d/c.               Difficult to place patient: No Authorized and performed by: Valente David, MD Total critical care time: Approximately  55 minutes. Due to a high probability of clinically significant, life-threatening deterioration, the patient required my highest level of preparedness to intervene emergently and I personally spent this critical care time directly and personally managing the patient.  This critical care time included obtaining a history, examining the patient, pulse oximetry, ordering and review of studies, arranging urgent treatment with development of management plan, evaluation of patient's response to treatment, frequent reassessment, and discussions with other providers. This critical care time was performed to assess and manage the high probability of imminent, life-threatening deterioration that could result in multiorgan failure.  It was exclusive of separately billable procedures and treating other patients and teaching time.      Hannah Beat M.D on 05-21-21 at 5:20 AM  [facility]   From 7 PM-7 AM, contact night-coverage [email or web address]  CC: Primary care physician; Louanne Skye, MD

## 2021-05-02 NOTE — ED Notes (Addendum)
Pt taken off bipap and placed on 4L [place] per Allena Katz MD. O2 sat 100%

## 2021-05-02 NOTE — ED Notes (Signed)
Pt done eating and placed back on bipap at this time.

## 2021-05-02 NOTE — ED Notes (Signed)
Pt requested to take off bipap to eat. Allena Katz MD notified

## 2021-05-02 NOTE — Progress Notes (Signed)
[facility] PLAN OF CARE NOTE Patient: Ian Herman:[id number]   PCP: Louanne Skye, MD DOB: 07/21/1945   DOA: 05-28-21   DOS: 05-28-21    Patient was admitted by my colleague earlier on 28-May-2021. I have reviewed the H&P as well as assessment and plan and agree with the same. Important changes in the plan are listed below.  Plan of care: Principal Problem:   COPD exacerbation (HCC) Continue BiPAP.  Patient reported some pain.  Will add tramadol.  Author: Lynden Oxford, MD [facility] May 28, 2021 7:42 PM   If 7PM-7AM, please contact night-coverage at [email or web address]

## 2021-05-02 NOTE — ED Notes (Signed)
Pt placed back on bipap. Allena Katz MD aware

## 2021-05-03 DIAGNOSIS — J441 Chronic obstructive pulmonary disease with (acute) exacerbation: Secondary | ICD-10-CM | POA: Diagnosis not present

## 2021-05-03 MED ORDER — SODIUM CHLORIDE 0.9 % IV SOLN: INTRAVENOUS | Status: DC | PRN

## 2021-05-03 NOTE — Plan of Care (Signed)
  Problem: Education: Goal: Knowledge of disease or condition will improve Outcome: Progressing Goal: Knowledge of the prescribed therapeutic regimen will improve Outcome: Progressing Goal: Individualized Educational Video(s) Outcome: Not Applicable

## 2021-05-03 NOTE — Progress Notes (Signed)
  Progress Note    EDIS HUISH   EXH:[id number]  DOB: 1946-05-05  DOA: 05/01/2021     1 Date of Service: 05/03/2021   Clinical Course Past medical history of HFpEF, COPD, CAD, depression, GERD, hep C, HTN, cachexia.  Presenting with complaints of shortness of breath and cough.  Found to have influenza A infection with COPD exacerbation.  Currently requiring BiPAP therapy.  Assessment and Plan Acute on chronic hypoxic respiratory failure. COPD exacerbation. Influenza A infection. Completed course of Tamiflu. Continues to have respiratory distress and currently on BiPAP as needed. Uses 4 L of oxygen at home. Currently on BiPAP therapy. Continue DuoNebs, continue steroids.  Continue IV antibiotics.  Continue supportive care.  HTN. Blood pressure rather normal.  We will continue.  GERD. Continue PPI.  CAD. Currently no acute events. Continue aspirin and beta-blocker therapy.  HLD. Continue statin.  Subjective:  Continues to have shortness of breath.  No nausea no vomiting but no fever no chills.  Reports that still has ongoing right-sided chest pain.  Continues to have cough.  Objective Vitals:   05/03/21 1558 05/03/21 1604 05/03/21 1843 05/03/21 1921  BP: (!) 164/81     Pulse: 72     Resp: 20     Temp: 98.7 F (37.1 C)     TempSrc:      SpO2: 97% 96% 97% 97%  Weight:      Height:       49.1 kg  Exam General: Appear in mild distress, no Rash; Oral Mucosa Clear, moist. no Abnormal Neck Mass Or lumps, Conjunctiva normal  Cardiovascular: S1 and S2 Present, no Murmur, Respiratory: increased respiratory effort, Bilateral Air entry present and bilateral  Crackles, no wheezes Abdomen: Bowel Sound present, Soft and no tenderness Extremities: trace Pedal edema Neurology: alert and oriented to time, place, and person affect appropriate. no new focal deficit Gait not checked due to patient safety concerns     Labs / Other Information My review of labs, imaging,  notes and other tests shows no new significant findings.    Disposition Plan: Status is: Inpatient  Remains inpatient appropriate because: Needing BiPAP therapy.  Time spent: 35 minutes [facility] 05/03/2021, 7:30 PM

## 2021-05-03 NOTE — Progress Notes (Signed)
Initial Nutrition Assessment  DOCUMENTATION CODES:   Severe malnutrition in context of chronic illness, Underweight  INTERVENTION:   -Ensure Enlive po TID, each supplement provides 350 kcal and 20 grams of protein  -MVI with minerals daily -Liberalize diet to regular  NUTRITION DIAGNOSIS:   Severe Malnutrition related to chronic illness (COPD) as evidenced by moderate fat depletion, severe fat depletion, moderate muscle depletion, severe muscle depletion, percent weight loss.  GOAL:   Patient will meet greater than or equal to 90% of their needs  MONITOR:   PO intake, Supplement acceptance, Labs, Weight trends, Skin, I & O's  REASON FOR ASSESSMENT:   Malnutrition Screening Tool    ASSESSMENT:   Ian Herman is a 75 y.o. Caucasian male with medical history significant for diastolic CHF, COPD, coronary artery disease, depression, GERD, hepatitis C, hypertension and coronary artery disease, presented to the emergency room with acute onset of worsening cough productive of yellow sputum as well as wheezing and dyspnea over the last couple of days.  He was admitted here from 11/15 till 11/19 for acute influenza, COPD exacerbation and acute respiratory failure and was discharged on 2 L O2 by nasal cannula.  He denied any chest pain or palpitations.  No fever or chills.  No nausea or vomiting or abdominal pain.  He has been 1 more day to finish 5-day course of Tamiflu.  Upon arrival of EMS today's pulse oximetry was 86% on room air.  He was given nebulized albuterol.  Pt admitted with COPD exacerbation.   Reviewed I/O's: +100 ml x 24 hours   UOP: 500 ml x 24 hours  Pt currently on Bi-pap at time of visit. He reports feeling about the same today. Noted meal tray at bedside. He reports he consumes about 2-3 meals per day ("whatever I want"). Pt became increasingly agitated at time of visit; states "if you don't let me take off this mask, then I'm not going to talk to you".    Reviewed wt hx; pt has experienced a 9.7% wt loss over the past month, which is significant for time frame.   Medications reviewed and include prednisone.   Labs reviewed.  NUTRITION - FOCUSED PHYSICAL EXAM:  Flowsheet Row Most Recent Value  Orbital Region Severe depletion  Upper Arm Region Severe depletion  Thoracic and Lumbar Region Severe depletion  Buccal Region Severe depletion  Temple Region Severe depletion  Clavicle Bone Region Severe depletion  Clavicle and Acromion Bone Region Severe depletion  Scapular Bone Region Severe depletion  Dorsal Hand Moderate depletion  Patellar Region Moderate depletion  Anterior Thigh Region Moderate depletion  Posterior Calf Region Moderate depletion  Edema (RD Assessment) None  Hair Reviewed  Eyes Reviewed  Mouth Reviewed  Skin Reviewed  Nails Reviewed       Diet Order:   Diet Order             Diet Heart Room service appropriate? Yes; Fluid consistency: Thin  Diet effective now                   EDUCATION NEEDS:   Education needs have been addressed  Skin:  Skin Assessment: Reviewed RN Assessment  Last BM:  04/28/2021  Height:   Ht Readings from Last 1 Encounters:  05/07/2021 5\' 8"  (1.727 m)    Weight:   Wt Readings from Last 1 Encounters:  04/24/2021 49.1 kg    Ideal Body Weight:  70 kg  BMI:  Body mass index is 16.46  kg/m.  Estimated Nutritional Needs:   Kcal:  1950-2150  Protein:  110-125 grams  Fluid:  > 1.9 L    Levada Schilling, RD, LDN, CDCES Registered Dietitian II Certified Diabetes Care and Education Specialist Please refer to [facility] for RD and/or RD on-call/weekend/after hours pager

## 2021-05-04 ENCOUNTER — Inpatient Hospital Stay: Payer: No Typology Code available for payment source

## 2021-05-04 DIAGNOSIS — J441 Chronic obstructive pulmonary disease with (acute) exacerbation: Secondary | ICD-10-CM | POA: Diagnosis not present

## 2021-05-04 LAB — BLOOD GAS, VENOUS
Acid-Base Excess: 9.9 mmol/L — ABNORMAL HIGH (ref 0.0–2.0)
Bicarbonate: 36 mmol/L — ABNORMAL HIGH (ref 20.0–28.0)
O2 Saturation: 90.7 %
Patient temperature: 37
pCO2, Ven: 53 mmHg (ref 44.0–60.0)
pH, Ven: 7.44 — ABNORMAL HIGH (ref 7.250–7.430)
pO2, Ven: 58 mmHg — ABNORMAL HIGH (ref 32.0–45.0)

## 2021-05-04 LAB — CBC
HCT: 36.5 % — ABNORMAL LOW (ref 39.0–52.0)
Hemoglobin: 12.1 g/dL — ABNORMAL LOW (ref 13.0–17.0)
MCH: 30.6 pg (ref 26.0–34.0)
MCHC: 33.2 g/dL (ref 30.0–36.0)
MCV: 92.2 fL (ref 80.0–100.0)
Platelets: 163 10*3/uL (ref 150–400)
RBC: 3.96 MIL/uL — ABNORMAL LOW (ref 4.22–5.81)
RDW: 12.7 % (ref 11.5–15.5)
WBC: 8.5 10*3/uL (ref 4.0–10.5)
nRBC: 0 % (ref 0.0–0.2)

## 2021-05-04 LAB — BASIC METABOLIC PANEL
Anion gap: 4 — ABNORMAL LOW (ref 5–15)
BUN: 34 mg/dL — ABNORMAL HIGH (ref 8–23)
CO2: 31 mmol/L (ref 22–32)
Calcium: 8.7 mg/dL — ABNORMAL LOW (ref 8.9–10.3)
Chloride: 97 mmol/L — ABNORMAL LOW (ref 98–111)
Creatinine, Ser: 0.62 mg/dL (ref 0.61–1.24)
GFR, Estimated: >60 mL/min (ref >60)
Glucose, Bld: 116 mg/dL — ABNORMAL HIGH (ref 70–99)
Potassium: 4.1 mmol/L (ref 3.5–5.1)
Sodium: 132 mmol/L — ABNORMAL LOW (ref 135–145)

## 2021-05-04 MED ORDER — DIPHENHYDRAMINE HCL 50 MG/ML IJ SOLN: 12.5000 mg | INTRAMUSCULAR | Status: DC | PRN

## 2021-05-04 MED ORDER — METHYLPREDNISOLONE SODIUM SUCC 40 MG IJ SOLR
40.0000 mg | Freq: Two times a day (BID) | INTRAMUSCULAR | Status: DC
  Administered 2021-05-04: 40 mg via INTRAVENOUS
  Filled 2021-05-04: qty 1

## 2021-05-04 MED ORDER — ALPRAZOLAM 0.25 MG PO TABS
0.2500 mg | ORAL_TABLET | Freq: Three times a day (TID) | ORAL | Status: DC | PRN
  Administered 2021-05-04 (×2): 0.25 mg via ORAL
  Filled 2021-05-04 (×2): qty 1

## 2021-05-04 MED ORDER — ACETAMINOPHEN 325 MG PO TABS: 650.0000 mg | ORAL_TABLET | Freq: Four times a day (QID) | ORAL | Status: DC | PRN

## 2021-05-04 MED ORDER — LORAZEPAM 2 MG/ML IJ SOLN
1.0000 mg | INTRAMUSCULAR | Status: DC | PRN
  Administered 2021-05-05 – 2021-05-06 (×6): 1 mg via INTRAVENOUS
  Filled 2021-05-04 (×4): qty 1

## 2021-05-04 MED ORDER — ONDANSETRON 4 MG PO TBDP
4.0000 mg | ORAL_TABLET | Freq: Four times a day (QID) | ORAL | Status: DC | PRN
  Filled 2021-05-04: qty 1

## 2021-05-04 MED ORDER — HALOPERIDOL LACTATE 2 MG/ML PO CONC
2.0000 mg | ORAL | Status: DC | PRN
  Filled 2021-05-04: qty 1

## 2021-05-04 MED ORDER — IPRATROPIUM-ALBUTEROL 0.5-2.5 (3) MG/3ML IN SOLN
3.0000 mL | RESPIRATORY_TRACT | Status: DC | PRN
  Administered 2021-05-04: 3 mL via RESPIRATORY_TRACT
  Filled 2021-05-04: qty 3

## 2021-05-04 MED ORDER — LORAZEPAM 2 MG/ML IJ SOLN
1.0000 mg | INTRAMUSCULAR | Status: DC | PRN
  Filled 2021-05-04 (×2): qty 1

## 2021-05-04 MED ORDER — LORAZEPAM 1 MG PO TABS: 1.0000 mg | ORAL_TABLET | ORAL | Status: DC | PRN

## 2021-05-04 MED ORDER — MAGIC MOUTHWASH
15.0000 mL | Freq: Four times a day (QID) | ORAL | Status: DC | PRN
  Filled 2021-05-04: qty 20

## 2021-05-04 MED ORDER — MORPHINE SULFATE (CONCENTRATE) 10 MG/0.5ML PO SOLN: 5.0000 mg | ORAL | Status: DC | PRN

## 2021-05-04 MED ORDER — MORPHINE SULFATE (CONCENTRATE) 10 MG/0.5ML PO SOLN
5.0000 mg | ORAL | Status: DC | PRN
  Administered 2021-05-04 – 2021-05-05 (×8): 5 mg via SUBLINGUAL
  Filled 2021-05-04 (×9): qty 0.5

## 2021-05-04 MED ORDER — MORPHINE 100MG IN NS 100ML (1MG/ML) PREMIX INFUSION
6.0000 mg/h | INTRAVENOUS | Status: DC
  Administered 2021-05-04 – 2021-05-06 (×2): 2 mg/h via INTRAVENOUS
  Administered 2021-05-07 (×2): 6 mg/h via INTRAVENOUS
  Filled 2021-05-04 (×4): qty 100

## 2021-05-04 MED ORDER — ONDANSETRON HCL 4 MG/2ML IJ SOLN: 4.0000 mg | Freq: Four times a day (QID) | INTRAMUSCULAR | Status: DC | PRN

## 2021-05-04 MED ORDER — HALOPERIDOL LACTATE 5 MG/ML IJ SOLN: 2.0000 mg | INTRAMUSCULAR | Status: DC | PRN

## 2021-05-04 MED ORDER — LORAZEPAM 2 MG/ML IJ SOLN
INTRAMUSCULAR | Status: AC
  Filled 2021-05-04: qty 1

## 2021-05-04 MED ORDER — HALOPERIDOL 2 MG PO TABS
2.0000 mg | ORAL_TABLET | ORAL | Status: DC | PRN
  Filled 2021-05-04: qty 1

## 2021-05-04 MED ORDER — LORAZEPAM 2 MG/ML PO CONC: 1.0000 mg | ORAL | Status: DC | PRN

## 2021-05-04 MED ORDER — MORPHINE BOLUS VIA INFUSION
1.0000 mg | INTRAVENOUS | Status: DC | PRN
  Administered 2021-05-04 – 2021-05-06 (×6): 1 mg via INTRAVENOUS
  Filled 2021-05-04: qty 1

## 2021-05-04 MED ORDER — LORAZEPAM 2 MG/ML IJ SOLN
0.5000 mg | Freq: Once | INTRAMUSCULAR | Status: AC
  Administered 2021-05-04: 0.5 mg via INTRAVENOUS

## 2021-05-04 MED ORDER — ACETAMINOPHEN 650 MG RE SUPP
650.0000 mg | Freq: Four times a day (QID) | RECTAL | Status: DC | PRN
  Filled 2021-05-04: qty 1

## 2021-05-04 NOTE — Assessment & Plan Note (Signed)
Severe anxiety secondary to respiratory distress. Ativan as needed.

## 2021-05-04 NOTE — Assessment & Plan Note (Signed)
Pleuritic in nature.  Chest x-ray negative.

## 2021-05-04 NOTE — Progress Notes (Signed)
Patient complaining of difficulty breathing on 4L nasal cannula with saturation of 98%. Patient does not have any prn respiratory meds. On call attending paged via amion. RT can give scheduled neb at the earliest 0700 which would difficult due to shift change.

## 2021-05-04 NOTE — Assessment & Plan Note (Signed)
Symptomatic management ?

## 2021-05-04 NOTE — Progress Notes (Signed)
Attending and Respiratory Therapist at bedside. New orders given.

## 2021-05-04 NOTE — Assessment & Plan Note (Addendum)
Extensive discussion with the patient as well as patient's friend on the phone. Patient does not want to be kept alive on a ventilator. 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 Patient wants to focus primarily on his symptom and wants to remain comfortable. Patient has ongoing right-sided chest pain as well as severe respiratory distress. Based on this conversation recommended patient to transition to complete comfort with morphine infusion. Anticipate in hospital death

## 2021-05-04 NOTE — Assessment & Plan Note (Signed)
Completed Tamiflu.  Appears to be continuously affecting his respiratory status.  Now comfort care.

## 2021-05-04 NOTE — Assessment & Plan Note (Signed)
No acute symptoms

## 2021-05-04 NOTE — Assessment & Plan Note (Signed)
No evidence of volume overload.  Patient appears to be dehydrated from poor p.o. intake.

## 2021-05-04 NOTE — Progress Notes (Addendum)
Patient oxygen saturations in the high 90s on 4L nasal cannula. Patient  wears bipap because he is adamant the physician has advised him that he must wear it at night. Per assessment and conversation with RT the patient is very stable without bipap but it provides some sort of comfort. Nursing attempted to educate without evidence of learning. Patient could benefit from more conversation with the attending during morning rounds.

## 2021-05-04 NOTE — Assessment & Plan Note (Signed)
Secondary to influenza infection.  Completed the therapy.  Now comfort care.

## 2021-05-04 NOTE — Assessment & Plan Note (Signed)
Severely cachectic. Secondary to COPD. A rendering poor prognosis for the patient.

## 2021-05-04 NOTE — Progress Notes (Signed)
Per MD - allow pt to visit with family / when family leaves - start morphine gtt

## 2021-05-04 NOTE — Assessment & Plan Note (Addendum)
Presented with complaints of shortness of breath and cough. Requiring BiPAP intermittently. Also was on 4 to 6 L of oxygen.  Currently in severe respiratory distress with tachypnea and tachycardia. Recently admitted for severe sepsis secondary to influenza. Patient's condition continues to worsen progressively and currently patient wants to transition to complete comfort.

## 2021-05-04 NOTE — Progress Notes (Signed)
[facility] Progress Note  Patient: Ian Herman    BHA:[id number]  DOA: 2021-05-28    Date of Service: the patient was seen and examined on 05/04/2021  Brief hospital course: No notes on file  Assessment and Plan: * COPD exacerbation (HCC) Secondary to influenza infection.  Completed the therapy.  Now comfort care.  Influenza A Completed Tamiflu.  Appears to be continuously affecting his respiratory status.  Now comfort care.  Acute on chronic respiratory failure with hypoxia and hypercapnia (HCC) Presented with complaints of shortness of breath and cough. Requiring BiPAP intermittently. Also continuing on 4 to 6 L of oxygen.  Currently in severe respiratory distress with tachypnea and tachycardia. Recently admitted for severe sepsis secondary to influenza. Patient's condition continues to worsen progressively and currently patient wants to transition to complete comfort.  Advanced care planning/counseling discussion Extensive discussion with the patient as well as patient's friend on the phone. Patient does not want to be kept alive on a ventilator. 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 Patient wants to focus primarily on his symptom and wants to remain comfortable. Patient has ongoing right-sided chest pain as well as severe respiratory distress. Based on this conversation recommended patient to transition to complete comfort with morphine infusion. Will initiate care in the hospital after family number has chance to visit the patient.  Chest pain Pleuritic in nature.  Chest x-ray negative.  Chronic diastolic CHF (congestive heart failure) (HCC) No evidence of volume overload.  Patient appears to be dehydrated from poor p.o. intake.  Coronary artery disease No acute symptoms.  Anxiety state Severe anxiety secondary to respiratory distress. Ativan as needed.  GERD (gastroesophageal  reflux disease) Symptomatic management.  Protein-calorie malnutrition, severe Severely cachectic. Secondary to COPD. A rendering poor prognosis for the patient.    Body mass index is 16.83 kg/m.  Nutrition Problem: Severe Malnutrition Etiology: chronic illness (COPD)     Subjective: Continues to have shortness of breath.  No nausea no vomiting or no fever no chills.  Ongoing chest pain, ongoing anxiety.  Ongoing cough.  Objective: Vital signs shows tachycardia and tachypnea.  Exam: General: Appear in mild distress, no Rash; Oral Mucosa Clear, moist. no Abnormal Neck Mass Or lumps, Conjunctiva normal  Cardiovascular: S1 and S2 Present, no Murmur, Respiratory: increased respiratory effort, Bilateral Air entry present and bilateral  Crackles, no wheezes Abdomen: Bowel Sound present, Soft and no tenderness Extremities: trace Pedal edema Neurology: alert and oriented to time, place, and person affect appropriate. no new focal deficit Gait not checked due to patient safety concerns     Data Reviewed: Chest x-ray negative for any significant pneumonia.  VBG shows PCO2 of 53 and pH of 7.44.  BMP stable.  CBC stable.  Disposition:  Status is: Inpatient  Remains inpatient appropriate because: Ongoing respiratory stress.  Now comfort care.  Requiring morphine infusion.   Family Communication: Discussed with patient's friend Eunice Blase on the phone.  Patient refused to discuss with another friend Eunice Blase.  DVT Prophylaxis: enoxaparin (LOVENOX) injection 40 mg Start: May 28, 2021 0800  Time spent: 35 minutes.   Author: Lynden Oxford  05/04/2021 7:22 PM  To reach On-call, see care teams to locate the attending and reach out via [email or web address]. Between 7PM-7AM, please contact night-coverage If you still have difficulty reaching the attending provider, please page the [facility] (Director on Call) for [facility] on amion for assistance.

## 2021-05-04 NOTE — Progress Notes (Signed)
Nursing in and out patient's room tonight due to his requests to be taken on and off bipap. Saturations remain >95% on 4 liters nasal cannula. Patient exbihits anxiety around the use of bipap.

## 2021-05-05 DIAGNOSIS — J441 Chronic obstructive pulmonary disease with (acute) exacerbation: Secondary | ICD-10-CM | POA: Diagnosis not present

## 2021-05-05 MED ORDER — GLYCOPYRROLATE 0.2 MG/ML IJ SOLN
0.1000 mg | Freq: Three times a day (TID) | INTRAMUSCULAR | Status: DC
  Administered 2021-05-05 – 2021-05-07 (×8): 0.1 mg via INTRAVENOUS
  Filled 2021-05-05 (×11): qty 0.5

## 2021-05-05 NOTE — Progress Notes (Signed)
Patient did not have comfort care orders, requested NP Jon Billings to place orders.

## 2021-05-05 NOTE — Progress Notes (Signed)
[facility] Progress Note  Patient: Ian Herman    OIN:[id number]  DOA: 05/20/2021    Date of Service: the patient was seen and examined on 05/05/2021  Brief hospital course: 11/21 admitted for copd exacerbation 11/13 converted to comfort care. On morphine infusion   Assessment and Plan: * COPD exacerbation (HCC) Secondary to influenza infection.  Completed the therapy.  Now comfort care.  Influenza A Completed Tamiflu.  Appears to be continuously affecting his respiratory status.  Now comfort care.  Acute on chronic respiratory failure with hypoxia and hypercapnia (HCC) Presented with complaints of shortness of breath and cough. Requiring BiPAP intermittently. Also was on 4 to 6 L of oxygen.  Currently in severe respiratory distress with tachypnea and tachycardia. Recently admitted for severe sepsis secondary to influenza. Patient's condition continues to worsen progressively and currently patient wants to transition to complete comfort.  Advanced care planning/counseling discussion Extensive discussion with the patient as well as patient's friend on the phone. Patient does not want to be kept alive on a ventilator. 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 Patient wants to focus primarily on his symptom and wants to remain comfortable. Patient has ongoing right-sided chest pain as well as severe respiratory distress. Based on this conversation recommended patient to transition to complete comfort with morphine infusion. Anticipate in hospital death  Chest pain Pleuritic in nature.  Chest x-ray negative.  Protein-calorie malnutrition, severe Severely cachectic. Secondary to COPD. A rendering poor prognosis for the patient.  Chronic diastolic CHF (congestive heart failure) (HCC) No evidence of volume overload.  Patient appears to be dehydrated from poor p.o. intake.  Coronary artery  disease No acute symptoms.  Anxiety state Severe anxiety secondary to respiratory distress. Ativan as needed.  GERD (gastroesophageal reflux disease) Symptomatic management.    Body mass index is 16.83 kg/m.  Nutrition Problem: Severe Malnutrition Etiology: chronic illness (COPD)     Subjective: appears comfortable. Still has secretions   Objective: Vital signs were reviewed and unremarkable except for: Respirations: 20s   Exam: General: Appear in mild distress, no Rash; Oral Mucosa Clear, moist. no Abnormal Neck Mass Or lumps, Conjunctiva normal  Cardiovascular: S1 and S2 Present, no Murmur, Respiratory: increased respiratory effort, Bilateral Air entry present and bilateral  Crackles, no wheezes Abdomen: Bowel Sound present Extremities: trace Pedal edema Neurology: lethargic Gait not checked due to patient safety concerns     Data Reviewed: There are no new results to review at this time.  Disposition:  Status is: Inpatient  Remains inpatient appropriate because: anticipate hospital death, comfort care, morphine infusion.          Family Communication: none at bedisde  DVT Prophylaxis:    Time spent: 35 minutes.   Author: Lynden Oxford  05/05/2021 3:23 PM  To reach On-call, see care teams to locate the attending and reach out via [email or web address]. Between 7PM-7AM, please contact night-coverage If you still have difficulty reaching the attending provider, please page the [facility] (Director on Call) for [facility] on amion for assistance.

## 2021-05-05 NOTE — Hospital Course (Addendum)
11/21 admitted for copd exacerbation 11/23 converted to comfort care. On morphine infusion

## 2021-05-06 DIAGNOSIS — Z515 Encounter for palliative care: Secondary | ICD-10-CM

## 2021-05-06 DIAGNOSIS — J441 Chronic obstructive pulmonary disease with (acute) exacerbation: Secondary | ICD-10-CM | POA: Diagnosis not present

## 2021-05-06 MED ORDER — SCOPOLAMINE 1 MG/3DAYS TD PT72
1.0000 | MEDICATED_PATCH | TRANSDERMAL | Status: DC
  Administered 2021-05-06: 1.5 mg via TRANSDERMAL
  Filled 2021-05-06: qty 1

## 2021-05-06 MED ORDER — MORPHINE BOLUS VIA INFUSION
2.0000 mg | INTRAVENOUS | Status: DC | PRN
  Administered 2021-05-06 – 2021-05-07 (×2): 2 mg via INTRAVENOUS
  Filled 2021-05-06: qty 2

## 2021-05-06 MED ORDER — LORAZEPAM 2 MG/ML IJ SOLN
0.5000 mg | Freq: Three times a day (TID) | INTRAMUSCULAR | Status: DC
  Administered 2021-05-06 – 2021-05-07 (×5): 0.5 mg via INTRAVENOUS
  Filled 2021-05-06 (×4): qty 1

## 2021-05-06 NOTE — Assessment & Plan Note (Signed)
Completed Tamiflu.  Appears to be continuously affecting his respiratory status.  Now comfort care.

## 2021-05-06 NOTE — Assessment & Plan Note (Signed)
Symptomatic management ?

## 2021-05-06 NOTE — Care Management Important Message (Signed)
Important Message  Patient Details  Name: Ian Herman MRN: [id number] Date of Birth: 1945/09/23   Medicare Important Message Given:  Other (see comment)  On comfort care measures.  Medicare IM withheld at this time.     Johnell Comings 05/06/2021, 9:34 AM

## 2021-05-06 NOTE — Assessment & Plan Note (Signed)
Continue morphine infusion.  Patient appears to be struggling to breathe right overnight.  And remains agitated. Currently on morphine infusion at rate of 6 mg/h. Along with morphine as needed pulse boluses.  We will increase that as well to 2 mg per Also had scheduled Ativan given his ongoing agitation. Discussed with RN to continue Robinul for secretion management.

## 2021-05-06 NOTE — Assessment & Plan Note (Signed)
No evidence of volume overload.  Patient appears to be dehydrated from poor p.o. intake.

## 2021-05-06 NOTE — Progress Notes (Signed)
[facility] Progress Note  Patient: Ian Herman    PYP:[id number]  DOA: 2021-05-11    Date of Service: the patient was seen and examined on 05/06/2021  Brief hospital course: 11/21 admitted for copd exacerbation 11/23 converted to comfort care. On morphine infusion  Assessment and Plan: * COPD exacerbation (HCC) Secondary to influenza infection.  Completed the therapy.  Now comfort care.  Influenza A Completed Tamiflu.  Appears to be continuously affecting his respiratory status.  Now comfort care.  Acute on chronic respiratory failure with hypoxia and hypercapnia (HCC) Presented with complaints of shortness of breath and cough. Requiring BiPAP intermittently. Also was on 4 to 6 L of oxygen.  Currently in severe respiratory distress with tachypnea and tachycardia. Recently admitted for severe sepsis secondary to influenza. Patient's condition continues to worsen progressively.  currently comfort care.  End of life care Continue morphine infusion.  Patient appears to be struggling to breathe right overnight.  And remains agitated. Currently on morphine infusion at rate of 6 mg/h. Along with morphine as needed pulse boluses.  We will increase that as well to 2 mg per Also had scheduled Ativan given his ongoing agitation. Discussed with RN to continue Robinul for secretion management.  Advanced care planning/counseling discussion Extensive discussion with the patient as well as patient's friend on the phone. Patient does not want to be kept alive on a ventilator. Initially patient was full code but now understand he has end-stage COPD and acute worsening secondary to influenza. Patient wants to focus primarily on his symptom and wants to remain comfortable. Based on this conversation recommended patient to transition to complete comfort with morphine infusion. Anticipate in hospital death  Chest pain Pleuritic in nature.  Chest x-ray negative.  Protein-calorie  malnutrition, severe Severely cachectic. Secondary to COPD. A rendering poor prognosis for the patient.  Chronic diastolic CHF (congestive heart failure) (HCC) No evidence of volume overload.  Patient appears to be dehydrated from poor p.o. intake.  Coronary artery disease No acute symptoms.  Anxiety state Severe anxiety secondary to respiratory distress. Ativan scheduled and as needed.  GERD (gastroesophageal reflux disease) Symptomatic management.    Body mass index is 16.83 kg/m.  Nutrition Problem: Severe Malnutrition Etiology: chronic illness (COPD)     Subjective: Continues to have shortness of breath.  No nausea no vomiting.  Remains admitted overnight.  Had multiple dose of IV Ativan as well as IV morphine which.  Objective: Tachycardic and tachypneic.  Blood pressure was elevated.  Exam: General: Appear in mild distress, no Rash; Oral Mucosa Clear, moist. no Abnormal Neck Mass Or lumps, Conjunctiva normal  Cardiovascular: S1 and S2 Present, no Murmur, Respiratory: increased respiratory effort, Bilateral Air entry present and bilateral  Crackles, bilateral wheezes Abdomen: Bowel Sound present, Extremities: trace Pedal edema Neurology: Lethargic, nonverbal, unable to follow any commands. Gait not checked due to patient safety concerns    Data Reviewed: There are no new results to review at this time.  Disposition:  Status is: Inpatient  Remains inpatient appropriate because: Unsafe discharge.  Anticipating in-hospital death with this patient.  Family Communication: None at bedside.  DVT Prophylaxis:    Time spent: 35 minutes.   Author: Lynden Oxford  05/06/2021 5:32 PM  To reach On-call, see care teams to locate the attending and reach out via [email or web address]. Between 7PM-7AM, please contact night-coverage If you still have difficulty reaching the attending provider, please page the [facility] (Director on Call) for [facility] on amion for  assistance.

## 2021-05-06 NOTE — Plan of Care (Signed)
  Problem: Education: Goal: Knowledge of General Education information will improve Description Including pain rating scale, medication(s)/side effects and non-pharmacologic comfort measures Outcome: Progressing   Problem: Health Behavior/Discharge Planning: Goal: Ability to manage health-related needs will improve Outcome: Progressing   Problem: Clinical Measurements: Goal: Ability to maintain clinical measurements within normal limits will improve Outcome: Progressing Goal: Will remain free from infection Outcome: Progressing Goal: Diagnostic test results will improve Outcome: Progressing Goal: Respiratory complications will improve Outcome: Progressing Goal: Cardiovascular complication will be avoided Outcome: Progressing   Problem: Activity: Goal: Risk for activity intolerance will decrease Outcome: Progressing   Problem: Nutrition: Goal: Adequate nutrition will be maintained Outcome: Progressing   Problem: Coping: Goal: Level of anxiety will decrease Outcome: Progressing   Problem: Elimination: Goal: Will not experience complications related to bowel motility Outcome: Progressing Goal: Will not experience complications related to urinary retention Outcome: Progressing   Problem: Pain Managment: Goal: General experience of comfort will improve Outcome: Progressing   Problem: Safety: Goal: Ability to remain free from injury will improve Outcome: Progressing   Problem: Skin Integrity: Goal: Risk for impaired skin integrity will decrease Outcome: Progressing   Problem: Education: Goal: Knowledge of disease or condition will improve Outcome: Progressing Goal: Knowledge of the prescribed therapeutic regimen will improve Outcome: Progressing   Problem: Activity: Goal: Ability to tolerate increased activity will improve Outcome: Progressing Goal: Will verbalize the importance of balancing activity with adequate rest periods Outcome: Progressing   Problem:  Respiratory: Goal: Ability to maintain a clear airway will improve Outcome: Progressing Goal: Levels of oxygenation will improve Outcome: Progressing Goal: Ability to maintain adequate ventilation will improve Outcome: Progressing   

## 2021-05-06 NOTE — Assessment & Plan Note (Signed)
No acute symptoms

## 2021-05-06 NOTE — Assessment & Plan Note (Signed)
Extensive discussion with the patient as well as patient's friend on the phone. Patient does not want to be kept alive on a ventilator. Initially patient was full code but now understand he has end-stage COPD and acute worsening secondary to influenza. Patient wants to focus primarily on his symptom and wants to remain comfortable. Based on this conversation recommended patient to transition to complete comfort with morphine infusion. Anticipate in hospital death

## 2021-05-06 NOTE — Assessment & Plan Note (Signed)
Severely cachectic. Secondary to COPD. A rendering poor prognosis for the patient.

## 2021-05-06 NOTE — Assessment & Plan Note (Signed)
Presented with complaints of shortness of breath and cough. Requiring BiPAP intermittently. Also was on 4 to 6 L of oxygen.  Currently in severe respiratory distress with tachypnea and tachycardia. Recently admitted for severe sepsis secondary to influenza. Patient's condition continues to worsen progressively.  currently comfort care.

## 2021-05-06 NOTE — Assessment & Plan Note (Addendum)
Severe anxiety secondary to respiratory distress. Ativan scheduled and as needed.

## 2021-05-06 NOTE — Progress Notes (Signed)
Nutrition Brief Note  Chart reviewed. Pt now transitioning to comfort care.  No further nutrition interventions planned at this time.  Please re-consult as needed.   Senica Crall W, RD, LDN, CDCES Registered Dietitian II Certified Diabetes Care and Education Specialist Please refer to AMION for RD and/or RD on-call/weekend/after hours pager   

## 2021-05-06 NOTE — Assessment & Plan Note (Signed)
Pleuritic in nature.  Chest x-ray negative.

## 2021-05-06 NOTE — Assessment & Plan Note (Signed)
Secondary to influenza infection.  Completed the therapy.  Now comfort care.

## 2021-05-07 DIAGNOSIS — J441 Chronic obstructive pulmonary disease with (acute) exacerbation: Secondary | ICD-10-CM | POA: Diagnosis not present

## 2021-05-07 NOTE — Assessment & Plan Note (Signed)
Pleuritic in nature.  Chest x-ray negative.  Currently comfort care.

## 2021-05-07 NOTE — Assessment & Plan Note (Signed)
Completed Tamiflu.  Appears to be continuously affecting his respiratory status.  Now comfort care.

## 2021-05-07 NOTE — Assessment & Plan Note (Signed)
Secondary to influenza infection.  Completed the therapy.  Now comfort care.

## 2021-05-07 NOTE — Assessment & Plan Note (Signed)
No evidence of volume overload.  Patient appears to be dehydrated from poor p.o. intake.

## 2021-05-07 NOTE — Assessment & Plan Note (Signed)
Continue morphine infusion.  Remains comfortable on current rate of morphine infusion at rate of 6 mg/h. Also had scheduled Ativan given his ongoing agitation. Continue scheduled Robinul as well.

## 2021-05-07 NOTE — Assessment & Plan Note (Signed)
Severe anxiety secondary to respiratory distress. Ativan scheduled and as needed.

## 2021-05-07 NOTE — Assessment & Plan Note (Signed)
Symptomatic management ?

## 2021-05-07 NOTE — Progress Notes (Signed)
[facility] Progress Note  Patient: Ian Herman    NFA:[id number]  DOA: 05/11/2021    Date of Service: the patient was seen and examined on 05/07/2021  Brief hospital course: 11/21 admitted for copd exacerbation 11/23 converted to comfort care. On morphine infusion   Assessment and Plan: * COPD exacerbation (HCC) Secondary to influenza infection.  Completed the therapy.  Now comfort care.  Influenza A Completed Tamiflu.  Appears to be continuously affecting his respiratory status.  Now comfort care.  Acute on chronic respiratory failure with hypoxia and hypercapnia (HCC) Presented with complaints of shortness of breath and cough. Requiring BiPAP on admission.  Remained with severe respiratory distress with tachypnea and tachycardia. Recently admitted for severe sepsis secondary to influenza. Patient's condition continues to worsen progressively.  currently comfort care.  End of life care Continue morphine infusion.  Remains comfortable on current rate of morphine infusion at rate of 6 mg/h. Also had scheduled Ativan given his ongoing agitation. Continue scheduled Robinul as well.  Advanced care planning/counseling discussion 11/23, extensive discussion with the patient as well as patient's friend on the phone. Patient does not want to be kept alive on a ventilator. Initially patient was full code but now understand he has end-stage COPD and acute worsening secondary to influenza. Patient wants to focus primarily on his symptom and wants to remain comfortable. Based on this conversation recommended patient to transition to complete comfort with morphine infusion. Anticipate in hospital death  Chest pain Pleuritic in nature.  Chest x-ray negative.  Currently comfort care.  Protein-calorie malnutrition, severe Severely cachectic. Secondary to COPD. A rendering poor prognosis for the patient.  Chronic diastolic CHF (congestive heart failure) (HCC) No evidence of  volume overload.  Patient appears to be dehydrated from poor p.o. intake.  Coronary artery disease No acute symptoms.  Anxiety state Severe anxiety secondary to respiratory distress. Ativan scheduled and as needed.  GERD (gastroesophageal reflux disease) Symptomatic management.    Body mass index is 16.83 kg/m.  Nutrition Problem: Severe Malnutrition Etiology: chronic illness (COPD)     Subjective: Remains comfortable.  No further agitation reported.  No nausea no vomiting.  Objective: Vital signs were reviewed and unremarkable.  Exam: General: Appear in mild distress, no Rash; Oral Mucosa Clear, dry Cardiovascular: S1 and S2 Present, no Murmur, Respiratory: good respiratory effort, Bilateral Air entry present and CTA, no Crackles, bilateral wheezes Abdomen: Bowel Sound present, Extremities: no Pedal edema Neurology: Lethargic, nonverbal, unable to follow commands. Comfortable. Gait not checked due to patient safety concerns    Data Reviewed: There are no new results to review at this time.  Disposition:  Status is: Inpatient  Remains inpatient appropriate because: Unsafe discharge.  Anticipating in-hospital death.   Family Communication: None at bedside.  DVT Prophylaxis:    Time spent: 35 minutes.   Author: Lynden Oxford  05/07/2021 7:01 PM  To reach On-call, see care teams to locate the attending and reach out via [email or web address]. Between 7PM-7AM, please contact night-coverage If you still have difficulty reaching the attending provider, please page the [facility] (Director on Call) for [facility] on amion for assistance.

## 2021-05-07 NOTE — Assessment & Plan Note (Signed)
No acute symptoms

## 2021-05-07 NOTE — Assessment & Plan Note (Signed)
Presented with complaints of shortness of breath and cough. Requiring BiPAP on admission.  Remained with severe respiratory distress with tachypnea and tachycardia. Recently admitted for severe sepsis secondary to influenza. Patient's condition continues to worsen progressively.  currently comfort care.

## 2021-05-07 NOTE — Assessment & Plan Note (Signed)
Severely cachectic. Secondary to COPD. A rendering poor prognosis for the patient.

## 2021-05-07 NOTE — Assessment & Plan Note (Signed)
11/23, extensive discussion with the patient as well as patient's friend on the phone. Patient does not want to be kept alive on a ventilator. Initially patient was full code but now understand he has end-stage COPD and acute worsening secondary to influenza. Patient wants to focus primarily on his symptom and wants to remain comfortable. Based on this conversation recommended patient to transition to complete comfort with morphine infusion. Anticipate in hospital death

## 2021-05-08 DIAGNOSIS — J441 Chronic obstructive pulmonary disease with (acute) exacerbation: Secondary | ICD-10-CM | POA: Diagnosis not present

## 2021-05-12 NOTE — Death Summary Note (Signed)
  DEATH SUMMARY   Patient Details  Name: Ian Herman MRN: [id number] DOB: [birth year]  Admission/Discharge Information   Admit Date:  May 17, 2021  Date of Death: Date of Death: 2021/05/23  Time of Death: Time of Death: 0601  Length of Stay: 6  Code Status:   Referring Physician: Louanne Skye, MD   Diagnoses  Preliminary cause of death: COPD exacerbation due to Influenza A infection in pt with end stage COPD Secondary Diagnoses (including complications and co-morbidities):  Principal Problem:   COPD exacerbation (HCC) Active Problems:   Acute on chronic respiratory failure with hypoxia and hypercapnia (HCC)   Influenza A   Advanced care planning/counseling discussion   End of life care   Protein-calorie malnutrition, severe   Chest pain   Coronary artery disease   Chronic diastolic CHF (congestive heart failure) (HCC)   Anxiety state   GERD (gastroesophageal reflux disease)    Brief Hospital Course  May 18, 2023 admitted for copd exacerbation 11/23 converted to comfort care. On morphine infusion * COPD exacerbation (HCC) Secondary to influenza infection.  Completed the therapy.  Now comfort care.   Influenza A Completed Tamiflu.  Appears to be continuously affecting his respiratory status.  Now comfort care.   Acute on chronic respiratory failure with hypoxia and hypercapnia (HCC) Presented with complaints of shortness of breath and cough. Requiring BiPAP on admission.  Remained with severe respiratory distress with tachypnea and tachycardia. Recently admitted for severe sepsis secondary to influenza. Patient's condition continues to worsen progressively.  Was comfort care.   End of life care Treated with morphine infusion due to severe respiratory distress, scheduled Ativan given his ongoing agitation and scheduled Robinul due to excessive secretion.    Advanced care planning/counseling discussion 11/23, extensive discussion with the patient as well as patient's  friend on the phone. Patient does not want to be kept alive on a ventilator. Initially patient was full code but now understand he has end-stage COPD and acute worsening secondary to influenza. Patient wants to focus primarily on his symptom and wants to remain comfortable. Based on this conversation recommended patient to transition to complete comfort with morphine infusion. Pt elected to switch to comfort care.    Chest pain Pleuritic in nature.  Chest x-ray negative.  Currently comfort care.   Protein-calorie malnutrition, severe Severely cachectic. Body mass index is 16.83 kg/m.  Secondary to COPD. A rendering poor prognosis for the patient.   Chronic diastolic CHF (congestive heart failure) (HCC) No evidence of volume overload.  Patient appears to be dehydrated from poor p.o. intake.   Coronary artery disease No acute symptoms.   Anxiety state Severe anxiety secondary to respiratory distress. Ativan scheduled and as needed.   GERD (gastroesophageal reflux disease) Symptomatic management.   Lynden Oxford 05/09/2021, 2:55 PM

## 2021-05-12 DEATH — deceased

## 2021-05-16 LAB — BLOOD GAS, VENOUS
Acid-Base Excess: 4.2 mmol/L — ABNORMAL HIGH (ref 0.0–2.0)
Bicarbonate: 31.8 mmol/L — ABNORMAL HIGH (ref 20.0–28.0)
O2 Saturation: 30.7 %
Patient temperature: 37
pCO2, Ven: 59 mmHg (ref 44.0–60.0)
pH, Ven: 7.34 (ref 7.250–7.430)
